# Patient Record
Sex: Female | Born: 1955 | Race: White | Hispanic: No | Smoking: Former smoker
Health system: Southern US, Community
[De-identification: ages and names within clinical notes are randomized; demographics above are authoritative.]

## PROBLEM LIST (undated history)

## (undated) DIAGNOSIS — K219 Gastro-esophageal reflux disease without esophagitis: Secondary | ICD-10-CM

## (undated) DIAGNOSIS — M549 Dorsalgia, unspecified: Secondary | ICD-10-CM

## (undated) DIAGNOSIS — M1990 Unspecified osteoarthritis, unspecified site: Secondary | ICD-10-CM

## (undated) DIAGNOSIS — K7460 Unspecified cirrhosis of liver: Secondary | ICD-10-CM

## (undated) DIAGNOSIS — B1910 Unspecified viral hepatitis B without hepatic coma: Secondary | ICD-10-CM

## (undated) DIAGNOSIS — C139 Malignant neoplasm of hypopharynx, unspecified: Secondary | ICD-10-CM

## (undated) DIAGNOSIS — B159 Hepatitis A without hepatic coma: Secondary | ICD-10-CM

## (undated) DIAGNOSIS — Z720 Tobacco use: Secondary | ICD-10-CM

## (undated) DIAGNOSIS — F329 Major depressive disorder, single episode, unspecified: Secondary | ICD-10-CM

## (undated) DIAGNOSIS — F419 Anxiety disorder, unspecified: Secondary | ICD-10-CM

## (undated) DIAGNOSIS — F32A Depression, unspecified: Secondary | ICD-10-CM

## (undated) DIAGNOSIS — IMO0002 Reserved for concepts with insufficient information to code with codable children: Secondary | ICD-10-CM

## (undated) DIAGNOSIS — G8929 Other chronic pain: Secondary | ICD-10-CM

## (undated) DIAGNOSIS — B1920 Unspecified viral hepatitis C without hepatic coma: Secondary | ICD-10-CM

## (undated) HISTORY — DX: Tobacco use: Z72.0

## (undated) HISTORY — DX: Reserved for concepts with insufficient information to code with codable children: IMO0002

## (undated) HISTORY — PX: OTHER SURGICAL HISTORY: SHX169

## (undated) HISTORY — DX: Major depressive disorder, single episode, unspecified: F32.9

## (undated) HISTORY — DX: Hepatitis a without hepatic coma: B15.9

## (undated) HISTORY — DX: Unspecified viral hepatitis B without hepatic coma: B19.10

## (undated) HISTORY — DX: Depression, unspecified: F32.A

## (undated) HISTORY — DX: Unspecified cirrhosis of liver: K74.60

## (undated) HISTORY — PX: TOTAL ABDOMINAL HYSTERECTOMY: SHX209

## (undated) HISTORY — PX: FOOT FRACTURE SURGERY: SHX645

## (undated) HISTORY — DX: Unspecified viral hepatitis C without hepatic coma: B19.20

---

## 2007-03-30 ENCOUNTER — Ambulatory Visit: Payer: Self-pay | Admitting: Cardiovascular Disease

## 2007-03-30 ENCOUNTER — Inpatient Hospital Stay (HOSPITAL_COMMUNITY): Admission: AC | Admit: 2007-03-30 | Discharge: 2007-05-17 | Payer: Self-pay

## 2007-03-30 ENCOUNTER — Ambulatory Visit: Payer: Self-pay | Admitting: Internal Medicine

## 2007-04-05 ENCOUNTER — Ambulatory Visit: Payer: Self-pay | Admitting: Physical Medicine & Rehabilitation

## 2007-04-11 DIAGNOSIS — B1920 Unspecified viral hepatitis C without hepatic coma: Secondary | ICD-10-CM

## 2007-04-11 HISTORY — PX: FOOT SURGERY: SHX648

## 2007-04-11 HISTORY — DX: Unspecified viral hepatitis C without hepatic coma: B19.20

## 2007-04-12 ENCOUNTER — Encounter: Payer: Self-pay | Admitting: Internal Medicine

## 2007-04-12 ENCOUNTER — Ambulatory Visit: Payer: Self-pay | Admitting: Surgery

## 2007-05-08 ENCOUNTER — Ambulatory Visit: Payer: Self-pay | Admitting: Infectious Diseases

## 2007-05-12 ENCOUNTER — Ambulatory Visit: Payer: Self-pay | Admitting: Internal Medicine

## 2007-05-20 ENCOUNTER — Inpatient Hospital Stay: Payer: Self-pay | Admitting: Internal Medicine

## 2007-06-09 ENCOUNTER — Ambulatory Visit: Payer: Self-pay | Admitting: Internal Medicine

## 2007-09-26 ENCOUNTER — Inpatient Hospital Stay (HOSPITAL_COMMUNITY): Admission: RE | Admit: 2007-09-26 | Discharge: 2007-09-29 | Payer: Self-pay | Admitting: Orthopedic Surgery

## 2007-12-11 ENCOUNTER — Ambulatory Visit (HOSPITAL_COMMUNITY): Admission: RE | Admit: 2007-12-11 | Discharge: 2007-12-11 | Payer: Self-pay | Admitting: Internal Medicine

## 2008-12-08 ENCOUNTER — Ambulatory Visit (HOSPITAL_COMMUNITY): Admission: RE | Admit: 2008-12-08 | Discharge: 2008-12-08 | Payer: Self-pay | Admitting: Orthopedic Surgery

## 2009-01-15 ENCOUNTER — Ambulatory Visit (HOSPITAL_COMMUNITY): Admission: RE | Admit: 2009-01-15 | Discharge: 2009-01-15 | Payer: Self-pay | Admitting: Family Medicine

## 2009-02-05 ENCOUNTER — Ambulatory Visit: Payer: Self-pay | Admitting: Gastroenterology

## 2009-02-17 ENCOUNTER — Ambulatory Visit (HOSPITAL_COMMUNITY): Admission: RE | Admit: 2009-02-17 | Discharge: 2009-02-17 | Payer: Self-pay | Admitting: Gastroenterology

## 2009-03-02 ENCOUNTER — Emergency Department (HOSPITAL_COMMUNITY): Admission: EM | Admit: 2009-03-02 | Discharge: 2009-03-02 | Payer: Self-pay | Admitting: Emergency Medicine

## 2009-04-22 ENCOUNTER — Ambulatory Visit: Payer: Self-pay | Admitting: Gastroenterology

## 2009-05-13 ENCOUNTER — Encounter (INDEPENDENT_AMBULATORY_CARE_PROVIDER_SITE_OTHER): Payer: Self-pay | Admitting: *Deleted

## 2009-06-08 HISTORY — PX: ESOPHAGOGASTRODUODENOSCOPY: SHX1529

## 2009-06-11 ENCOUNTER — Encounter: Payer: Self-pay | Admitting: Orthopedic Surgery

## 2009-06-17 ENCOUNTER — Ambulatory Visit: Payer: Self-pay | Admitting: Gastroenterology

## 2009-06-17 DIAGNOSIS — B182 Chronic viral hepatitis C: Secondary | ICD-10-CM | POA: Insufficient documentation

## 2009-07-01 ENCOUNTER — Ambulatory Visit: Payer: Self-pay | Admitting: Gastroenterology

## 2009-07-01 ENCOUNTER — Ambulatory Visit (HOSPITAL_COMMUNITY): Admission: RE | Admit: 2009-07-01 | Discharge: 2009-07-01 | Payer: Self-pay | Admitting: Gastroenterology

## 2009-07-09 ENCOUNTER — Encounter: Payer: Self-pay | Admitting: Orthopedic Surgery

## 2009-08-08 ENCOUNTER — Encounter: Payer: Self-pay | Admitting: Orthopedic Surgery

## 2009-09-08 ENCOUNTER — Encounter: Payer: Self-pay | Admitting: Orthopedic Surgery

## 2009-10-08 ENCOUNTER — Encounter: Payer: Self-pay | Admitting: Orthopedic Surgery

## 2009-11-19 ENCOUNTER — Encounter: Payer: Self-pay | Admitting: Gastroenterology

## 2009-12-21 ENCOUNTER — Ambulatory Visit: Payer: Self-pay | Admitting: Gastroenterology

## 2009-12-21 DIAGNOSIS — K7460 Unspecified cirrhosis of liver: Secondary | ICD-10-CM | POA: Insufficient documentation

## 2010-01-08 HISTORY — PX: COLONOSCOPY: SHX174

## 2010-01-13 ENCOUNTER — Ambulatory Visit (HOSPITAL_COMMUNITY): Admission: RE | Admit: 2010-01-13 | Discharge: 2010-01-13 | Payer: Self-pay | Admitting: Gastroenterology

## 2010-01-13 ENCOUNTER — Ambulatory Visit: Payer: Self-pay | Admitting: Gastroenterology

## 2010-01-28 ENCOUNTER — Encounter (INDEPENDENT_AMBULATORY_CARE_PROVIDER_SITE_OTHER): Payer: Self-pay | Admitting: *Deleted

## 2010-02-08 DIAGNOSIS — K7460 Unspecified cirrhosis of liver: Secondary | ICD-10-CM

## 2010-02-08 HISTORY — DX: Unspecified cirrhosis of liver: K74.60

## 2010-02-16 ENCOUNTER — Encounter: Payer: Self-pay | Admitting: Gastroenterology

## 2010-02-28 ENCOUNTER — Encounter: Payer: Self-pay | Admitting: Gastroenterology

## 2010-02-28 LAB — CONVERTED CEMR LAB
AFP-Tumor Marker: 14.4 ng/mL — ABNORMAL HIGH (ref 0.0–8.0)
ALT: 49 units/L — ABNORMAL HIGH (ref 0–35)
Alkaline Phosphatase: 82 units/L (ref 39–117)
Basophils Absolute: 0 10*3/uL (ref 0.0–0.1)
Basophils Relative: 0 % (ref 0–1)
CO2: 27 meq/L (ref 19–32)
Creatinine, Ser: 0.79 mg/dL (ref 0.40–1.20)
Eosinophils Absolute: 0 10*3/uL (ref 0.0–0.7)
Hep A Total Ab: POSITIVE — AB
INR: 1.17 (ref <1.50)
MCHC: 35 g/dL (ref 30.0–36.0)
MCV: 92.1 fL (ref 78.0–100.0)
Monocytes Relative: 7 % (ref 3–12)
Neutro Abs: 2.7 10*3/uL (ref 1.7–7.7)
Neutrophils Relative %: 68 % (ref 43–77)
Platelets: 80 10*3/uL — ABNORMAL LOW (ref 150–400)
RDW: 13.9 % (ref 11.5–15.5)
Total Bilirubin: 0.7 mg/dL (ref 0.3–1.2)

## 2010-03-07 ENCOUNTER — Ambulatory Visit (HOSPITAL_COMMUNITY): Admission: RE | Admit: 2010-03-07 | Discharge: 2010-03-07 | Payer: Self-pay | Admitting: Gastroenterology

## 2010-03-07 ENCOUNTER — Telehealth: Payer: Self-pay | Admitting: Gastroenterology

## 2010-05-02 ENCOUNTER — Encounter: Payer: Self-pay | Admitting: Internal Medicine

## 2010-05-10 NOTE — Assessment & Plan Note (Signed)
Summary: HCV, ETOH USE, CIRRHOSIS   Visit Type:  Follow-up Visit Primary Care Provider:  [facility]  Chief Complaint:  F/U cirrhosis.  History of Present Illness: No record of TCS from [place] or [facility]. Saw Dr. Jacqualine Mau recently. Desires an MRI  in [facility] 2011. Pt awaiting decision in HCV treatment. Frequent nausea in the morning. Rare vomiting. No diarrhea, melena, constipation., or BRBPR. Trying to lose weight. No EtOH.  Hypertension History:      Positive major cardiovascular risk factors include female age 57 years old or older and current tobacco user.     Current Medications (verified): 1)  Symbicort 80-4.5 Mcg/act Aero (Budesonide-Formoterol Fumarate) .... As Directed 2)  Lexapro 20 Mg Tabs (Escitalopram Oxalate) .... One and One-Half Tablet Daily 3)  Abilify 15 Mg Tabs (Aripiprazole) .... Take 1 Tablet By Mouth Once A Day 4)  Trazodone Hcl 150 Mg Tabs (Trazodone Hcl) .... Take 1 Tablet By Mouth Once A Day 5)  Adderall 10 Mg Tabs (Amphetamine-Dextroamphetamine) .... Take 1 Tablet By Mouth Once A Day 6)  Calcium .... One Tablet Daily  Allergies (verified): No Known Drug Allergies  Past History:  Past Surgical History: Last updated: 06/17/2009 Pelvic and extremity surgery Hysterectomy: DUB 1995  Family History: Last updated: 06/17/2009 No FH of Colon Cancer or polyps  Social History: Last updated: 06/17/2009 Divorced Patient currently smokes. 1 cig ever now and then Alcohol Use - no, quit 7 years ago  Illicit Drug Use - no  Past Medical History: HEP C Depression, problems concentrating, MHS([place]) Asthma?  EGD MAR 2011: Grade 1 varices TCS ? Hepatitis B SAb POS JUNE 2009  Family History: Reviewed history from 06/17/2009 and no changes required. No FH of Colon Cancer or polyps  Social History: Reviewed history from 06/17/2009 and no changes required. Divorced Patient currently smokes. 1 cig ever now and then Alcohol Use - no, quit 7 years ago  Illicit  Drug Use - no  Review of Systems       2008: CT AP w/ ivc: ? cirrhosis  NOV 2010: MRI ABD: cirrhosis  June 28, 2009:  PT 15.7, INR 1.26, potassium 4.2, creatinine 0.85.  white count 4.8, hemoglobin 13.8, platelets 80.  PER HPI OTHERWISE ALL SYSTEMS NEGATIVE  Vital Signs:  Patient profile:   55 year old female Height:      66 inches Weight:      146 pounds BMI:     23.65 Temp:     98.8 degrees F oral Pulse rate:   72 / minute BP sitting:   120 / 80  (left arm) Cuff size:   regular  Vitals Entered By: Cloria Spring LPN (December 21, 2009 4:31 PM)  Physical Exam  General:  Well developed, well nourished, no acute distress. Head:  Normocephalic and atraumatic. Eyes:  PERRL, no icterus. Mouth:  No deformity or lesions. Neck:  Supple; no masses. Lungs:  Clear throughout to auscultation. Heart:  Regular rate and rhythm; no murmurs. Abdomen:  Soft, nontender and nondistended. Normal bowel sounds. Extremities:  No edema noted. Neurologic:  Alert and  oriented x4;  grossly normal neurologically.  Impression & Recommendations:  Problem # 1:  CHRONIC HEPATITIS C WITHOUT MENTION HEPATIC COMA (ICD-070.54) past ETOH Use and cirrhosis w/ evidence for portal HTN: varices, low plts. BEGIN NADOLOL 20 MG QD. DISCUSSED SIDE EFFECTS. Imaging yearly. AFP/CMP/PT/INR/CBC 2X/YEAR. OPV MAY 2012. Labs in Nov 2011 one week prior to MRI OF THE LIVER.  CC: PCP AND DR. ZACKS  Orders: T-AFP Tumor Markers ([id number]) T-Comprehensive Metabolic Panel [phone]) T-PT (Prothrombin Time) ([address]562) T-CBC w/Diff ([id number]) T-Hepatitis A Antibody ([id number])  Problem # 2:  CIRRHOSIS (ICD-571.5) SEE #1.  Hypertension Assessment/Plan:      The patient's hypertensive risk group is category B: At least one risk factor (excluding diabetes) with no target organ damage.  Today's blood pressure is 120/80.   Prescriptions: CORGARD 20 MG TABS (NADOLOL) 1 by mouth QHS  #30 x 6   Entered and  Authorized by:   West Bali MD   Signed by:   West Bali MD on 12/21/2009   Method used:   Electronically to        [facility] (retail)       [address]       [place], [place]  [address]       Ph: [phone]       Fax: [phone]   RxID:   [id number]   Appended Document: Orders Update    Clinical Lists Changes  Orders: Added new Service order of Est. Patient Level IV (66063) - Signed

## 2010-05-10 NOTE — Letter (Signed)
Summary: Appointment Reminder  [facility]  [address]   [place], [place] [address]   Phone: [phone]  Fax: [phone]       May 13, 2009   [facility] [address] [place], [place]  [address] 1955-06-24    Dear Ms. Colaizzi,  We have been unable to reach you by phone to schedule a follow up   appointment that was recommended for you by Dr. Jena Gauss. It is very   important that we reach you to schedule an appointment. We hope that you  allow [place] to participate in your health care needs. Please contact us at  [phone] at your earliest convenience to schedule your appointment.  Sincerely,    [facility] R. Roetta Sessions, M.D.    Kassie Mends, M.D. Lorenza Burton, FNP-BC    Tana Coast, PA-C Phone: [phone]    Fax: [phone]

## 2010-05-10 NOTE — Letter (Signed)
Summary: EGD ORDER  EGD ORDER   Imported By: Ave Filter 06/17/2009 10:40:41  _____________________________________________________________________  External Attachment:    Type:   Image     Comment:   External Document

## 2010-05-10 NOTE — Progress Notes (Signed)
Summary: NAUSEA       New/Updated Medications: PROMETHAZINE HCL 25 MG TABS (PROMETHAZINE HCL) 1/2-1 by mouth Q4-6H as needed NAUSEA OR VOMITING Prescriptions: PROMETHAZINE HCL 25 MG TABS (PROMETHAZINE HCL) 1/2-1 by mouth Q4-6H as needed NAUSEA OR VOMITING  #30 x 0   Entered and Authorized by:   West Bali MD   Signed by:   West Bali MD on 03/07/2010   Method used:   Electronically to        [facility] (retail)       [address]       [place], [place]  [address]       Ph: [phone]       Fax: [phone]   RxID:   [id number]

## 2010-05-10 NOTE — Letter (Signed)
Summary: MRI OF THE LIVER ORDER  MRI OF THE LIVER ORDER   Imported By: Ave Filter 02/16/2010 09:45:26  _____________________________________________________________________  External Attachment:    Type:   Image     Comment:   External Document

## 2010-05-10 NOTE — Letter (Signed)
Summary: Recall Radiology  [facility]  [address]   [place], [place] [address]   Phone: [phone]  Fax: [phone]    January 28, 2010  [facility] [address] [place], [place]  [address] 1955-11-19   Dear Ms. Kock,   Our office needs to get you scheduled for your MRI. Please give our office a call to schedule this.  You may call the office at your convenience at [phone].  Please ask for the Referral Coordinator to make arrangements for this to be scheduled.  You may have to leave a message on our voice mail.  We will return your call.  If for any reason you do not wish to schedule this, please advise the office.  Please do not neglect your health.   Thank you,    Ave Filter  [facility] Ph: [phone]   Fax: [phone]

## 2010-05-10 NOTE — Assessment & Plan Note (Signed)
Summary: HEPATITIS C, ?CIRRHOSIS   Visit Type:  Initial Consult Referring Provider:  Dr. Brooke Dare Primary Care Provider:  Nobie Putnam, M.D.  Chief Complaint:  Hepatitis C.  History of Present Illness: Never had an EGD. [facility] wants to start Rx for Hep C with a new drug. No nausea, vomiting, heartburn, indigestion, problems swallowing, abd pain, diarrhea, constipation, blood in stool, or black stool.  Appetite: too good. Gaining weight. BM: daily. TCS last FEB 2010-at [facility]: no polyps.  Preventive Screening-Counseling & Management  Alcohol-Tobacco     Smoking Status: current      Drug Use:  no.    Current Medications (verified): 1)  Symbicort 80-4.5 Mcg/act Aero (Budesonide-Formoterol Fumarate) .... As Directed 2)  Lexapro 20 Mg Tabs (Escitalopram Oxalate) .... One and One-Half Tablet Daily 3)  Abilify 15 Mg Tabs (Aripiprazole) .... Take 1 Tablet By Mouth Once A Day 4)  Trazodone Hcl 150 Mg Tabs (Trazodone Hcl) .... Take 1 Tablet By Mouth Once A Day 5)  Adderall 10 Mg Tabs (Amphetamine-Dextroamphetamine) .... Take 1 Tablet By Mouth Once A Day 6)  Calcium .... One Tablet Daily 7)  Ibuprofen .... As Needed  Allergies (verified): No Known Drug Allergies  Past History:  Past Medical History: Depression, problems concentrating Asthma?   Past Surgical History: Pelvic and extremity surgery Hysterectomy: DUB 1995  Family History: No FH of Colon Cancer or polyps  Social History: Divorced Patient currently smokes. 1 cig ever now and then Alcohol Use - no, quit 7 years ago  Illicit Drug Use - no Smoking Status:  current Drug Use:  no  Review of Systems        No problems with sedation. No recollection of propofol. Per HPI otherwise all systems negative. OCT 2010: 155 LBS JAN 2011: 165 LBS   2008: CT ABD w/ ivc-?cirrhosis, nl pancreas and spleen 2009: TBILI 0.7 ALK PHOS 128 AST 56 ALT 53 ALB 3.8 INR 1.3,  2009: VIRAL SEROLOGIES-HBsAB POS, TOTAL HAV POS, HCV POS  AUG  2010: PLT 63   Vital Signs:  Patient profile:   55 year old female Height:      66 inches Weight:      166 pounds BMI:     26.89 Temp:     98.4 degrees F oral Pulse rate:   88 / minute BP sitting:   148 / 88  (left arm) Cuff size:   large  Vitals Entered By: Cloria Spring LPN (June 17, 2009 10:07 AM)  Physical Exam  General:  Well developed, well nourished, no acute distress. Head:  Normocephalic and atraumatic. Eyes:  PERRLA, no icterus. Mouth:  No deformity or lesions, edentulous, upper plates in place. Neck:  Supple; no masses. Lungs:  Clear throughout to auscultation. Heart:  Regular rate and rhythm; no murmurs, rubs,  or bruits. Abdomen:  Soft, mild TTP in LLQ and nondistended.  Normal bowel sounds without guarding and without rebound.   Extremities:  No edema  noted. Neurologic:  Alert and  oriented x4;  grossly normal neurologically. flat affect.  Impression & Recommendations:  Problem # 1:  CHRONIC HEPATITIS C WITHOUT MENTION HEPATIC COMA (ICD-070.54)  Pt needs screening for varices. EGD MAR 24 w/ propofol due to many psychoactive drugs. OPV as needed. Obtain TCS report. Obtain records form [facility] for the past year.  CC: PCP, and Dr. Jacqualine Mau  Orders: Consultation Level III [phone])      Appended Document: HEPATITIS C, ?CIRRHOSIS NO TCS DONE AT [facility] SINCE 2000.  Appended  Document: HEPATITIS C, ?CIRRHOSIS No TCS at [facility] or [facility] since 2000.

## 2010-05-10 NOTE — Letter (Signed)
Summary: re-evaluation note -dr. Jacqualine Mau  re-evaluation note -dr. Jacqualine Mau   Imported By: Rosine Beat 11/19/2009 13:24:56  _____________________________________________________________________  External Attachment:    Type:   Image     Comment:   External Document

## 2010-05-23 ENCOUNTER — Encounter: Payer: Self-pay | Admitting: Urgent Care

## 2010-06-01 NOTE — Medication Information (Signed)
Summary: OMEPRAZOLE DR 20MG   OMEPRAZOLE DR 20MG    Imported By: Rexene Alberts 05/23/2010 09:39:12  _____________________________________________________________________  External Attachment:    Type:   Image     Comment:   External Document  Appended Document: OMEPRAZOLE DR 20MG     Prescriptions: OMEPRAZOLE 20 MG CPDR (OMEPRAZOLE) 1 by mouth daily for acid reflux  #31 x 11   Entered and Authorized by:   Joselyn Arrow FNP-BC   Signed by:   Joselyn Arrow FNP-BC on 05/23/2010   Method used:   Electronically to        [facility] (retail)       [address]       [place], [place]  [address]       Ph: [phone]       Fax: [phone]   RxID:   [id number]

## 2010-06-23 LAB — COMPREHENSIVE METABOLIC PANEL
ALT: 64 U/L — ABNORMAL HIGH (ref 0–35)
Albumin: 3.5 g/dL (ref 3.5–5.2)
Alkaline Phosphatase: 91 U/L (ref 39–117)
Chloride: 105 mEq/L (ref 96–112)
Potassium: 4.8 mEq/L (ref 3.5–5.1)
Sodium: 140 mEq/L (ref 135–145)
Total Protein: 8 g/dL (ref 6.0–8.3)

## 2010-06-23 LAB — CBC
HCT: 37.8 % (ref 36.0–46.0)
Platelets: 67 10*3/uL — ABNORMAL LOW (ref 150–400)
RBC: 3.99 MIL/uL (ref 3.87–5.11)
RDW: 13.5 % (ref 11.5–15.5)
WBC: 4.1 10*3/uL (ref 4.0–10.5)

## 2010-06-23 LAB — DIFFERENTIAL
Basophils Relative: 1 % (ref 0–1)
Eosinophils Absolute: 0.1 10*3/uL (ref 0.0–0.7)
Eosinophils Relative: 2 % (ref 0–5)
Monocytes Absolute: 0.3 10*3/uL (ref 0.1–1.0)
Monocytes Relative: 9 % (ref 3–12)

## 2010-06-23 LAB — HEPATITIS A ANTIBODY, TOTAL: Hep A Total Ab: POSITIVE — AB

## 2010-07-04 LAB — CBC
HCT: 39.5 % (ref 36.0–46.0)
MCV: 94.7 fL (ref 78.0–100.0)
Platelets: 80 10*3/uL — ABNORMAL LOW (ref 150–400)
RDW: 13.3 % (ref 11.5–15.5)
WBC: 4.8 10*3/uL (ref 4.0–10.5)

## 2010-07-04 LAB — BASIC METABOLIC PANEL
BUN: 9 mg/dL (ref 6–23)
CO2: 24 mEq/L (ref 19–32)
Chloride: 103 mEq/L (ref 96–112)
Glucose, Bld: 96 mg/dL (ref 70–99)
Potassium: 4.2 mEq/L (ref 3.5–5.1)

## 2010-07-16 LAB — CBC
MCHC: 34.7 g/dL (ref 30.0–36.0)
MCV: 102.6 fL — ABNORMAL HIGH (ref 78.0–100.0)
RBC: 3.96 MIL/uL (ref 3.87–5.11)

## 2010-08-11 ENCOUNTER — Ambulatory Visit (INDEPENDENT_AMBULATORY_CARE_PROVIDER_SITE_OTHER): Payer: Medicaid Other | Admitting: Gastroenterology

## 2010-08-11 DIAGNOSIS — K7460 Unspecified cirrhosis of liver: Secondary | ICD-10-CM

## 2010-08-11 DIAGNOSIS — B182 Chronic viral hepatitis C: Secondary | ICD-10-CM

## 2010-08-16 ENCOUNTER — Encounter: Payer: Self-pay | Admitting: Gastroenterology

## 2010-08-23 NOTE — Op Note (Signed)
Tiffany Hale, Tiffany Hale            ACCOUNT NO.:  [id number]   MEDICAL RECORD NO.:  [id number]          PATIENT TYPE:  INP   LOCATION:  3103                         FACILITY:  [facility]   PHYSICIAN:  Doralee Albino. Carola Frost, M.D. DATE OF BIRTH:  April 21, 1955   DATE OF PROCEDURE:  04/02/2007  DATE OF DISCHARGE:                               OPERATIVE REPORT   PREOPERATIVE DIAGNOSIS:  1. Left iliac wing fracture.  2. Blisters over the right foot and ankle.   POSTOPERATIVE DIAGNOSIS:  1. Left iliac wing fracture.  2. Blisters over the right foot and ankle.   PROCEDURE:  1. Open reduction and internal fixation left iliac/ pelvis.  2. Dressing change under anesthesia, right foot and ankle.  3. Incision and drainage of bullae, right foot and ankle.   SURGEON:  Budd Palmer, MD   ASSISTANT:  Mearl Latin, PA   ANESTHESIA:  General, Dr. Sondra Come.   COMPLICATIONS:  None.   ESTIMATED BLOOD LOSS:  150.   DRAINS:  None.   DISPOSITION:  To PACU.   CONDITION:  Stable.   BRIEF SUMMARY AND INDICATION FOR PROCEDURE:  Tiffany Hale is a 55-  year-old female who sustained severe right calcaneus and ankle fracture,  as well as left acetabulum/pelvic fracture.  She underwent observation  in the ICU until such time as the trauma service felt she was safe to  proceed for operative fixation.  She did undergo angio, but she has not  demonstrated any active arterial bleeding.  We discussed preoperatively  the risks and benefits of surgical treatment of her pelvic fracture, as  well as of her soft tissue injuries on the right foot and ankle.  She  understood those to include infection, failure to prevent infection,  need for further surgery, nerve, vessel injury particularly lateral  thigh, numbness, blood loss, crying , transfusion.  Malunion, nonunion,  arthritis, and others.  After full discussion, she wished to proceed.   BRIEF DESCRIPTION OF PROCEDURE:  Ms. Marcel was administered preop  antibiotics, taken to the operating room where general anesthesia was  induced.  We then removed the dressings on the right foot and ankle.  We  then identified a large horseshoe blister which extended nearly  circumferentially, given the appearance of saddle bags medially and  laterally.  There was over 250 mL of serous fluid, and the bullae  laterally extended from above the ankle, all the way out to the toe  creases beyond the metatarsal heads and medially above the ankle, down  to the first MTP joint.  There was also an additional plantar bullae  that was separate.  The largest blister was evacuated by using a 15  blade adjacent to where the bullae connected posteriorly around the  Achilles tendon.  Also made a small incision with the knife around the  plantar bullae as well.  After evacuation we applied Mepitel dressings  and then softly compressive gauze, and Kerlix, followed by Ace wrap and  lastly a night splint to facilitate further dressing changes.   We then performed a standard prep and drape of the left side of  the  pelvis.  The hip was flexed and also a small bolster placed under the  left side of the pelvis as well.  We made a standard anterior approach  with a curvilinear incision over where the anterior superior iliac spine  was projected to be in its reduced position.  Carried dissection down  looking for the lateral femoral cutaneous nerve beneath the fascia.  We  did identify a nerve, but it was questionable whether or not this was in  fact the lateral femoral cutaneous.  It was not stretched excessively  during the case and was protected.  We used a Farabeuf clamp to mobilize  the fracture.  After cleaning it with lavage and curets, we then  obtained provisional reduction and K-wires to maintain this.  The K-  wires were then exchanged for multiple lag screws using 35 fixation.  It  should be noted that prior to beginning on the pelvis, we did obtain AP  and Judet  views to evaluate for significant extension into the  acetabulum that may have required a more formal reduction of the  articular surface.  This was not visualized and consequently our primary  goal was to restore integrity to the inguinal ligament and rectus  insertion along the anterior aspect of the pelvis and anterior column of  the acetabulum.  We obtained multiple x-ray views including AP, Judet,  and inlet/ outlet and these confirmed appropriate reduction, and  placement of hardware.  We then thoroughly irrigated and closed in  standard layered fashion using a #1 Vicryl, 0 Vicryl, and a 2-0, and  staples for the skin.  Sterile, gently compressive dressing was applied.  The patient was awakened for anesthesia and transported to PACU in  stable condition.   PROGNOSIS:  Ms. Krenzer has had severe injuries to her pelvis and her  right calcaneus and ankle.  Her soft tissue condition precludes surgery  for several weeks.  She will need to be on DVT prophylaxis until such  time as she can safely undergo definitive reconstruction.  We do  anticipate her being able to weight bear on the left side, but we will  have her refrain from active hip flexion, and particularly flexion  against resistance until we see clinical and radiographic evidence of  healing.  Of note, her bone quality seemed to be quite poor, which is  something we will factor into our decision making regarding the  calcaneus.  This also increases the likelihood of loss of fixation and  complications related to same.      Doralee Albino. Carola Frost, M.D.  Electronically Signed     MHH/MEDQ  D:  04/02/2007  T:  04/02/2007  Job:  [id number]

## 2010-08-23 NOTE — Op Note (Signed)
NAMELARKIN, ALFRED            ACCOUNT NO.:  [id number]   MEDICAL RECORD NO.:  [id number]          PATIENT TYPE:  AMB   LOCATION:  SDS                          FACILITY:  [facility]   PHYSICIAN:  Doralee Albino. Carola Frost, M.D. DATE OF BIRTH:  Nov 29, 1955   DATE OF PROCEDURE:  12/08/2008  DATE OF DISCHARGE:  12/08/2008                               OPERATIVE REPORT   PREOPERATIVE DIAGNOSIS:  Symptomatic hardware, right calcaneus.   POSTOPERATIVE DIAGNOSIS:  Symptomatic hardware, right calcaneus.   PROCEDURE:  Removal of the implant right calcaneus.   SURGEON:  Doralee Albino. Carola Frost, MD   ASSISTANT:  Mearl Latin, PA.   ANESTHESIA:  General.   COMPLICATIONS:  None.   ESTIMATED BLOOD LOSS:  Minimal.   DISPOSITION:  PACU.   CONDITION:  Stable.   BRIEF SUMMARY OF INDICATIONS FOR PROCEDURE:  Jalesa Thien is a 35-  year-old female who sustained a severe right calcaneus fracture treated  with subtalar fusion.  The patient went on to unite but has had  progressive pain related to her hardware and she now requests this  removal.  I discussed with her the risks and benefits of surgery  including the possibility of failure to alleviate symptoms, need for  further surgery, and multiple others including DVT, infection, nerve  injury, or vessel injury.  After full discussion, she wished to proceed.   BRIEF DESCRIPTION OF PROCEDURE:  Ms. Ellwanger was administered  preoperative antibiotics, taken to the operating room where general  anesthesia was induced.  Her right lower extremity was prepped and  draped in the usual sterile fashion.  No tourniquet was used.  C-arm was  used to identify the correct trajectory and location for re-making  incisions along the old surgical scars.  Dissection was carried down  with a hemostat identifying the head of the screws.  The guide pin was  inserted into the 2 large cannulated screws and then all screws were  withdrawn using several small stab incisions.   Wounds were irrigated and  closed with 3-0 nylon.  Sterile gently compressive dressing was applied.  The patient was awakened from anesthesia and transported to the PACU in  stable condition.  Montez Morita, PA-C, assisted me throughout the  procedure.   PROGNOSIS:  Ms. Halder will continue with limited weightbearing until  her incisions heal over the next 10 days or so.  We will see her back at  that time, 10-14 days for removal of her sutures.  She will contact us  in the interim with any problems, concerns, or questions.  She will not  require any formal DVT prophylaxis.      Doralee Albino. Carola Frost, M.D.  Electronically Signed     MHH/MEDQ  D:  12/08/2008  T:  12/09/2008  Job:  [id number]

## 2010-08-23 NOTE — Consult Note (Signed)
Tiffany Hale, Tiffany Hale            ACCOUNT NO.:  [id number]   MEDICAL RECORD NO.:  [id number]          PATIENT TYPE:  INP   LOCATION:  5018                         FACILITY:  [facility]   PHYSICIAN:  Antonietta Breach, M.D.  DATE OF BIRTH:  [birth year]   DATE OF CONSULTATION:  05/08/2007  DATE OF DISCHARGE:                                 CONSULTATION   HISTORY OF PRESENT ILLNESS:  Tiffany Hale does continue with delusions,  hallucinations and severe agitation.  She has received a dosage of 5 mg  Zyprexa today and has tolerated well.  There is no excess sedation.   QTC on recent telemetry shows 530 milliseconds.   MENTAL STATUS EXAM:  The patient is alert, highly distractible.  Thought  process disorganized.  Oriented to person only.  Affect mildly agitated  after the Zyprexa.  Judgment impaired.  Insight poor.   ASSESSMENT:  (293.00)  Delirium not otherwise specified.   PLAN:  Will hold Zyprexa for now until it can be verified that the  patient's QTC interval is less than 500 or verified by Cardiology input  that there is no contraindication to proceeding with Zyprexa.      Antonietta Breach, M.D.  Electronically Signed     JW/MEDQ  D:  05/09/2007  T:  05/09/2007  Job:  [id number]

## 2010-08-23 NOTE — Discharge Summary (Signed)
NAMECAMDYN, Tiffany Hale            ACCOUNT NO.:  [id number]   MEDICAL RECORD NO.:  [id number]          PATIENT TYPE:  INP   LOCATION:  5018                         FACILITY:  [facility]   PHYSICIAN:  Gabrielle Dare. Janee Morn, M.D.DATE OF BIRTH:  [birth year]   DATE OF ADMISSION:  03/30/2007  DATE OF DISCHARGE:  05/17/2007                               DISCHARGE SUMMARY   DISCHARGE DIAGNOSES:  1. Suicide attempt by jumping from a third story window.  2. Traumatic brain injury with concussion.  3. Multiple facial fractures.  4. Left iliac wing and acetabular fracture.  5. Right ankle fracture.  6. Pelvic hematoma.  7. Depression.  8. Polysubstance abuse.  9. Right calcaneus fracture.  10.Acute blood loss anemia.  11.Thrombocytopenia.  12.Left first toe proximal phalanx fracture.  13.ARDS.  14.Pneumonia.  15.Hyperglycemia.  16.Hypokalemia.  17.Hypernatremia.  18.Methicillin resistant staph aureus pneumonia.  19.Hepatitis C.  20.Chin laceration.   CONSULTATIONS:  1. Newman Pies, M.D., for oral maxillofacial surgery.  2. Francena Hanly, M.D., for orthopedic surgery.  3. Myrene Galas, M.D., for orthopedic surgery.  4. Critical Care Medicine.  5. Antonietta Breach, M.D., Psychiatry.   PROCEDURES:  1. Draining of fracture blister of right foot and incision and      drainage of left iliac wing fracture by Dr. Carola Frost.  2. Multiple blood transfusions.  3. Intubation.   HISTORY OF PRESENT ILLNESS:  This is a 55 year old white female who  jumped from a third story window following release from prison. She left  a suicide note. She apparently had been up for several days after taking  large amounts of unknown pills and other substances. She comes in as a  Silver Trauma alert and was upgraded to Gold, with altered loss of  consciousness. She complains of headache, shoulder, and ankle pain.   LABORATORY DATA:  The patient's workup demonstrated the multiple facial  fractures, the pelvic, and right  ankle fracture. She had a small chin  laceration as well. She was admitted to the Intensive Care Unit and the  appropriate specialties were consulted.   HOSPITAL COURSE:  The patient rather quickly had a ORIF of her pelvic  fracture and I&D of her ankle fracture. Her mental status improved  quickly and she mainly complained of pain. Following her pelvic surgery,  it was decided that skilled nursing facility placement was the most  appropriate venue for her, if she was unable to do much with physical  therapy. While waiting for skilled nursing facility placement, she  developed some acute delirium. This quickly progressed to delirium with  respiratory insufficiency and she required intubation. Immediately, her  chest x-ray demonstrated very severe ARDS with an atypical pattern. An  infectious cause was thought initially but even though her chest x-ray  appeared atypical, no significant pathogens were grown that would  explain that. She required very aggressive ventilator settings to manage  her pulmonary disease. Critical care medicine was the main service in  charge of this. At some point, when it appeared that she was not making  any progress, we did speak with the family about withdrawal. This was  seriously considered but they decided to wait it out, since she began to  improve during this time frame. Eventually, she was able to improve to  the point where we could extubate her. Her mental status took some time  to clear but eventually did. Again, skilled nursing facility placement  was sought for the patient, who was quite deconditioned after her  extended time on the ventilator, as well as her continued weight bearing  restrictions on her right lower extremity. However, family decided they  wanted to take her home. She went home with her family in good  condition.   DISCHARGE MEDICATIONS:  1. Lopressor 50 mg take 1 p.o. b.i.d., #60 with no refill.  2. Combivent inhaler to use 2  puffs q.6 hours p.r.n. #1 with no      refill.  3. Metformin 500 mg take 1 p.o. b.i.d. #60 with no refill.  4. Ambien 10 mg tablets take 1 p.o. q.h.s. p.r.n. insomnia #30 with no      refill.   FOLLOWUP:  1. The patient will establish with a primary care physician sometimes      in the next 4 weeks in the [place] area, where she will be staying      with her son.  2. She is to followup with Dr. Carola Frost in 2 weeks.  3. Followup with the pulmonologist and with oral maxillofacial      surgery, as well as trauma surgery can be on an as needed basis.   SPECIAL INSTRUCTIONS:  1. She will be going home with home health, PT, OT, speech therapy for      cognition and swallow, as well as significant durable medical      equipment.  2. She continues to have a weight bearing restriction for the right      lower extremity. This was communicated to the      family.  3. She also has a diet restriction to nectar thick liquids and this      too was communicated to the family.  4. Questions should be directed toward the trauma service.      Earney Hamburg, P.A.      Gabrielle Dare Janee Morn, M.D.  Electronically Signed    MJ/MEDQ  D:  05/17/2007  T:  05/17/2007  Job:  [id number]   cc:   Vania Rea. Supple, M.D.  Doralee Albino. Carola Frost, M.D.  Newman Pies, MD  Antonietta Breach, M.D.  Critical Care Medicine

## 2010-08-23 NOTE — Consult Note (Signed)
[name], [name]            ACCOUNT NO.:  [id number]   MEDICAL RECORD NO.:  [id number]          PATIENT TYPE:  INP   LOCATION:  3103                         FACILITY:  [facility]   PHYSICIAN:  Doralee Albino. Carola Frost, M.D. DATE OF BIRTH:  1955-12-17   DATE OF CONSULTATION:  03/31/2007  DATE OF DISCHARGE:                                 CONSULTATION   REASON FOR CONSULTATION REQUEST:  Severe right calcaneus fracture,  bimalleolar ankle fracture, right and left pelvic fracture.   BRIEF HISTORY OF PRESENTATION:  Tiffany Hale is a 55 year old female  who jumped out of a third story window in an attempted suicide,  sustaining multiple injuries including facial fractures, left pelvic  wing and right bimalleolar ankle fractures.  She did also experience  acute blood loss anemia requiring a transfusion.  She underwent  angiography for a pelvic hematoma which did not demonstrate continued  bleeding.  She also had a concussion.  At this time she is alert,  oriented, sitting upright in bed, complaining of right ankle pain.  She  denies numbness or tingling in the left lower extremity.  She denies  upper extremity injury, but does report decreased sensation in the right  foot on the dorsal and plantar aspects of.   PAST MEDICAL HISTORY:  1. Depression.  2. Alcohol abuse; sober for 6 years.   KNOWN MEDICATIONS:  1. Adderall.  2. Prozac.   SOCIAL HISTORY:  Again, a history of alcohol abuse, but sober for 6  years.  She does smoke half a pack a day of cigarettes.  She currently  lives with her mother.  She has an adult child who is married and living  in the area.   ALLERGIES:  NO KNOWN DRUG ALLERGIES.   REVIEW OF SYSTEMS:  As above.  Also additional reviewed and included in  the chart.   PHYSICAL EXAMINATION:  The patient has an ecchymotic face with  significant swelling.  The left pelvic wing is tender.  She has some  swelling in the area but no significant ecchymosis.  Examination of  the  shoulders, elbows, wrists and hands bilaterally notable for the absence  of any significant focal tenderness, crepitus, blocked motion or  diminishment in strength.  Radial, median and ulnar sensory and motor  function are grossly intact with 2+ radial pulses.  Examination of the  left lower extremity notable for absence of focal tenderness,  ecchymosis, crepitus, block to motion or diminished strength at the  greater trochanter, knee and ankle.  No ligamentous laxity or  instability.  On the right, she does have some swelling of the calf;  compartments seem soft.  She is able to flex and extend the great toes;  the lesser toes to a much lesser degree.  She has extensive bullae that  are clear extending over the dorsum of the foot.  She is immobilized in  a gently compressive splint.  She has capillary refill of 2-3 seconds  out into the toes.  She does have a cross toe deformity of the second,  decreased sensation over the plantar aspect, decreased sensation over  the  superficial and deep peroneal nerves, as well.  Strength exam  confounded by the pain of course.   LABORATORY STUDIES:  Notable for decreased hematocrit; otherwise,  reviewed and included in the chart without significant other findings.   X-rays and CT scan of the pelvis, ankle and foot were all reviewed.  These demonstrate a displaced avulsion fracture of the entire anterior  crest of the left pelvis, also a new lateral compression type II  fracture with sacral involvement and pelvic wing involvement.  The right  calcaneus has severe articular comminution and depression shortening.  There is a bimalleolar ankle fracture, as well, with a Weber C type  comminuted fracture involving the fibula above the joint and a medial  malleolus fracture which is not significantly comminuted but is somewhat  displaced.   ASSESSMENT:  1. Left anterior pelvis fracture and avulsion, LC1 pattern injury/      anterior column of  acetabulum  2. In addition, a second right bimalleolar ankle fracture, Weber C.  3. Right comminuted intra-articular fracture of the calcaneus.  4. Fracture of the right navicular bone.  5. Severe bullae right foot and ankle.   PLAN:  I have recommended internal fixation of left pelvic wing, and  will try to achieve this tomorrow.  Given the significant soft tissue  swelling of the right foot, will need to delay any sort of definitive  treatment over there until soft tissues allow.  At this time, we will  follow her nerve deficits expectantly.  It is unclear whether or not an  occult compartment syndrome could have occurred.  If so, there would be  no indication at this time to intervene, given it has been 48 hours.  This seems quite unlikely, given her examination at this time, and I  would suspect that the deficit is related to the ankle trauma rather  than muscle injury more proximally.  I have discussed his case with Dr.  Rennis Chris, and he requested I assume care, which I will be happy to do.  I  have discussed with the patient the risks and benefits of surgery  including the possibility of infection, nerve injury, vessel injury,  nonunion, malunion, need for further surgery, nerve injury, particularly  lateral femoral cutaneous nerve, vessel injury requiring further  procedures and need for further surgery.  After full discussion, the  patient wishes to proceed.  She also understands other perioperative  risks such as heart attack, stroke, DVT, PE and others.      Doralee Albino. Carola Frost, M.D.  Electronically Signed     MHH/MEDQ  D:  04/01/2007  T:  04/01/2007  Job:  [id number]

## 2010-08-23 NOTE — Consult Note (Signed)
Tiffany Hale, EBEL NO.:  [id number]   MEDICAL RECORD NO.:  [id number]          PATIENT TYPE:  INP   LOCATION:  2116                         FACILITY:  [facility]   PHYSICIAN:  Cristi Loron, M.D.DATE OF BIRTH:  [birth year]   DATE OF CONSULTATION:  04/23/2007  DATE OF DISCHARGE:                                 CONSULTATION   CHIEF COMPLAINT:  Decreased mental status.   HISTORY OF PRESENT ILLNESS:  The patient is a 55 year old white female  who by report attempted suicide by jumping from a third-story window.  She evidently left the suicide note.  The patient was brought to the  emergency department via EMS and evaluated by the trauma service.  She  had multiple injuries and was admitted.  The patient had several  operations.  The patient had had prolonged hospital course.  She has had  respiratory failure and has had a prolong course of mechanical  ventilation/intubation.  She has also had a prolong course of chemical  paralysis with sedation.  At some point, the patient has had a decreased  mental status and a frank-CT scan obtained today demonstrating perhaps a  diffuse subarachnoid hemorrhage and neurosurgical consultation was  requested.   Presently, the patient is sedated with Versed and Fentanyl.  She is  intubated.  There is no family members around and her history is  obtained via the medical records.   PAST MEDICAL HISTORY:  As above; positive for suicide attempt, traumatic  brain injury, concussion, multiple facial fractures, left iliac ring  fracture with right ankle fracture, pelvic hematoma, depressions,  polysubstance abuse, right calcaneus fracture, thrombocytopenia, right  first toe proximal phalanx fracture, pneumonia, ARDS, hepatitis C,  hyperglycemia, hyperkalemia, hypernatremia, and alcohol abuse.   PAST SURGICAL HISTORY:  Open reduction and internal fixation of the left  iliac and pelvis, incision and drainage of both legs, right foot,  and  ankle.   ALLERGIES:  See note on drug allergies.   PRESENT MEDICATIONS:  1. Lovenox 40 mg p.o. daily.  2. Thiamine 100 mg p.o. daily.  3. Multivitamins 1 p.o. daily.  4. Folic acid 1 mg p.o. daily.  5. Combivent inhaler 1 puff q.6 h.  6. Insulin sliding scale.  7. Lacri-Lube ophthalmic drops.  8. Protonix 40 mg daily.  9. Chlorhexidine 15 mL b.i.d.  10.Ferrous sulfate 300 mg t.i.d.  11.Lopressor 50 mg p.o. b.i.d.  12.Reglan 10 mg IV q.6 h.  13.K-Dur 40 mg q.12 h. p.o.  14.Lantus insulin 15 units subcutaneously q.12 h.  15.Primaxin IV per pharmacy protocol.  16.Lasix 20 mg q.6 h. IV.  17.Enteral feeds.  18.Zofran p.r.n.  19.Neosporin p.r.n.  20.Haldol p.r.n.  21.Versed p.r.n.  22.Tylenol p.r.n.  23.Fentanyl IV.  24.Lopressor.  25.Hydralazine p.r.n.   FAMILY MEDICAL HISTORY:  None known.   SOCIAL HISTORY:  History of drug abuse and ethanol abuse as above.  The  patient evidently lives with her mother.   REVIEW OF SYSTEMS:  Unobtainable.   PHYSICAL EXAMINATION:  GENERAL:  A 55 year old white traumatized female  sedated on ventilator.  HEENT:  Normocephalic.  The patient has left  periorbital old ecchymosis.  Pupils are 3 mm of active OU.  Extraocular muscles could not be  examined.  She is edentulous.  I do not see any evidence of CSF,  otorrhea, or rhinorrhea.  NECK:  Without obvious deformities to palpation.  No jugular venous  distention.  THORAX:  Symmetric.  HEART:  Regular rhythm.  ABDOMEN:  Soft and nontender.  EXTREMITIES:  The patient has orthosis on right lower extremity.  BACK:  Grossly normal.  NEUROLOGIC:  The patient's Glasgow coma scale is 7, E2V1M4.  She weakly  and abnormally flexes her extremities to deep painful stimuli.  I was  unable to get her to move her lower extremities.  Her deep tendon  reflexes are 2/4 in bilateral biceps, triceps, brachioradialis,  quadriceps, and gastrocnemius on the left.  Reflexes on the left was  absent.  I  could not test on the right because of the orthosis.  Sensory, cerebellar, and cranial nerve function was impossible except as  follows; she has a corneal reflex bilaterally.  She has a calf reflex.   IMAGING STUDIES:  1. I have reviewed the patient's admission head CT when last obtained      during the intracranial exam performed on March 30, 2007.  There      was no hemorrhages of skull fractures, etc.  2. I also reviewed the patient's follow up CT performed on April 23, 2007.  It demonstrates the patient's right maxillary and sphenoid      sinusitis.  She has a minimal and questionable diffuse subarachnoid      hemorrhage and complexities.  I do not see any evidence of blood in      cisterns.  No large sub dermatomes and hematoma, etc.  3. I also reviewed the patient's cervical CT performed on March 30, 2007.  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  She has diffuse      degenerative changes.  I do not see any acute fractures.   ASSESSMENT AND PLAN:  1. Decreased mental status.  This is likely secondary to the patient's      prolonged hospital course and ARDS, prolong intubation, sedation,      and chemical paralysis, etc.  This will likely prove with time.  If      it does not, and      the patient is febrile, she may need a spinal tap (she is already      on Primaxin).  2. Subarachnoid hemorrhage.  This is quite subtle and minimal perhaps      secondary to Lovenox.  I recommended repeating her CAT scan in few      days.      Cristi Loron, M.D.  Electronically Signed     JDJ/MEDQ  D:  04/23/2007  T:  04/24/2007  Job:  [id number]

## 2010-08-23 NOTE — Op Note (Signed)
[name], [name]            ACCOUNT NO.:  [id number]   MEDICAL RECORD NO.:  [id number]          PATIENT TYPE:  OIB   LOCATION:  5010                         FACILITY:  [facility]   PHYSICIAN:  Doralee Albino. Carola Frost, M.D. DATE OF BIRTH:  26-Apr-1955   DATE OF PROCEDURE:  09/26/2007  DATE OF DISCHARGE:                               OPERATIVE REPORT   PREOPERATIVE DIAGNOSIS:  Right calcaneus malunion.   POSTOPERATIVE DIAGNOSIS:  Right calcaneus malunion.   PROCEDURE:  1. Right calcaneus osteotomy, Dwyer.  2. Bone block subtalar fusion.   SURGEON:  Doralee Albino. Carola Frost, M.D.   ASSISTANT:  Mearl Latin, [place].   ANESTHESIA:  General.   TOTAL TOURNIQUET TIME:  2 hours and 7 minutes.   ESTIMATED BLOOD LOSS:  80 mL.   DRAINS:  One.   DISPOSITION:  To PACU.   CONDITION:  Stable.   BRIEF SUMMARY AND INDICATION FOR PROCEDURE:  Tiffany Hale is a 55-  year-old female, status post 3-storey jump from a window, resulting in  severe calcaneus fracture with the talus essentially being on the  plantar aspect of the foot.  She had undergone a period 6 months of soft  tissue swelling resolution.  She is an extremist for the initial period  of time and also has soft tissue blisters and below that were  circumferentially and precluded surgical management at that time.  She  has had unrelenting pain and now presents for elective reconstruction  realizing the risk to include the possibility of deep infection from  skin breakdown and wound problems, failure to alleviate her symptoms,  need for further surgery, DVT, PE, and others including eventual  amputation.   BRIEF DESCRIPTION OF PROCEDURE:  Tiffany Hale was taken to operating room  where general anesthesia was induced.  She was positioned right side up  and her right lower extremity prepped and draped in usual sterile  fashion.  We made a standard extensile approach to the calcaneus after  elevation of the tourniquet to 300 mmHg.  The peroneus  longus and brevis  tendons were identified.  They were completely pushed out of the  interval between fibula and the calcaneus.  This space had been entirely  obliterated.  We again used K-wires for retraction of the flaps, so that  we had no touch technique.  We used a Osteotone to remove the lateral  wall and then made a separate distal lateral-to-proximal medial  osteotomy, by which we could translate the calcaneus proximally to  obtain more length as well as bring it down to obtain more HIDA.  After  the osteotomy was made, we could visualize the subtalar joint.  Calcaneus still had some subchondral cartilage as did the inferior  aspect of the talus.   This was denuded with an Osteotone and curettes and then fish scaled.  We placed the lamina spreader into this space to restore appropriate  HIDA information to the talus.  We then translated the tuberosity as for  medially as we could and also swung into valgus.  Her bone quality was  exceedingly poor and it was difficult to obtain  the proper amount of  valgus despite our best efforts and adequate soft tissue release.  Similarly, we were constrained with respect to the total amount of  height we were able to obtain.  We then obtained provisional fixation  with multiple K-wires and exchanged these for cannulated titanium 6.5  screws to the tuberosity into the talar body and head as well as 3.5  screws from proximal to distal into the anterior facet of the calcaneus.  In to the defect prior to fixation was placed segments of osteotomized  bone in the form of bone block off the calcaneus.  We used additional  local graft as well as 10 mL VITOSS pack, which was placed into this  area.  This gave [place] an excellent fitting and feel with appropriate  recovery of the contour to the calcaneus.  We were careful to make sure  that appropriate channel was left for the peroneal tendons and that this  area was smooth to avoid abrasion to the tendons,  which looks  surprisingly healthy.  We irrigated thoroughly, deflated the tourniquet,  obtained hemostasis with electrocautery and performed a standard layer  closure with 2-0 Vicryl, 3-0 Vicryl, and 3-0 nylon.  Sterile, gently  compressive, fluff dressing and bulky Jones was then applied as well as  posterior splint.  The patient was awakened from the anesthesia and  transported to PACU in stable condition.  Prior to closure, we did place  a deep drain.   Montez Morita, PA, assisted throughout the procedure with retraction and  then maintenance of reduction during instrumentation as well as  placement of fixation while I held reduction in some cases.  He was  critical to the completion of the procedure.   PROGNOSIS:  Tiffany Hale has had a horrendous injury to her right lower  extremity and remains at high risk for complications including wound  breakdown deep infection and continued pain and will be on DVT  prophylaxis with Lovenox.  She will have unrestrictive range of motion  of her ankle joint as soon as her wound heals, but would need to be non  weightbearing for the next 6-8 weeks.  She is gradually weightbearing  thereafter.      Doralee Albino. Carola Frost, M.D.  Electronically Signed     MHH/MEDQ  D:  09/26/2007  T:  09/27/2007  Job:  [id number]
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
## 2010-08-31 ENCOUNTER — Other Ambulatory Visit: Payer: Self-pay | Admitting: Gastroenterology

## 2010-08-31 ENCOUNTER — Ambulatory Visit (INDEPENDENT_AMBULATORY_CARE_PROVIDER_SITE_OTHER): Payer: Medicaid Other | Admitting: Gastroenterology

## 2010-08-31 ENCOUNTER — Encounter: Payer: Self-pay | Admitting: Gastroenterology

## 2010-08-31 VITALS — BP 141/81 | HR 72 | Temp 98.2°F | Ht 65.0 in | Wt 139.8 lb

## 2010-08-31 DIAGNOSIS — B1920 Unspecified viral hepatitis C without hepatic coma: Secondary | ICD-10-CM

## 2010-08-31 DIAGNOSIS — B182 Chronic viral hepatitis C: Secondary | ICD-10-CM

## 2010-08-31 DIAGNOSIS — K7460 Unspecified cirrhosis of liver: Secondary | ICD-10-CM

## 2010-08-31 NOTE — Assessment & Plan Note (Signed)
Pending workup/treatment at [facility].

## 2010-08-31 NOTE — Progress Notes (Signed)
Reminder in epic to follow up in 6 months °

## 2010-08-31 NOTE — Assessment & Plan Note (Addendum)
Dysplastic nodules on MRI NOV 2011.  Repeat MRI w/ EOVIST. OPV in 6 mos. Obtain labs from [facility]. Continue Nadolol for primary prophylaxis for varices.  CC: Dr. Jacqualine Mau

## 2010-08-31 NOTE — Progress Notes (Addendum)
Subjective:     Patient ID: Tiffany Hale, female   DOB: 06-Jul-1955, 55 y.o.   MRN: [id number]  PCP: Nobie Putnam  ADDITIONAL GI DOC: DR. ZACKS  HPI Intentionally lost weight from 166 lbs to 139 lbs. Pending Rx for HCV. No swelling in legs or abdomen. Has bloating. Appetite: good. Bms: one/d. Rare nausea. No vomiting. Rare EtOH-2 beers 6-7 mos ago.  Past Medical History  Diagnosis Date  . Depression     problems concentrating, MHS ([facility])  . Asthma   . Hepatitis     C: Hepatitis B Sab POS June 2009  . HCV (hepatitis C virus) 2009  . HBV (hepatitis B virus) infection 2009: HBsAb POS  . Hepatitis A 2009 Ab POS  . Tobacco abuse   . Cirrhosis NOV 2011    MRI LIVER-5 SML DYSPLASTIC NODULES; 2009: TBILI 0.7 ALK PHOS 128 AST 56 ALT 53 ALB 3.8 INR 1.3  . BMI (body mass index) 20.0-29.9 2010 155 LBS   Past Surgical History  Procedure Date  . Vesicovaginal fistula closure w/ tah 1995    DUB  . Extremity surgery     and Pelvic  . Esophagogastroduodenoscopy MAR 2011    Grade 1 varices, GASTRITIS  . Total abdominal hysterectomy 1995 DUB  . Colonoscopy OCT 2011     6 MM SIMPLE ADENOMA, BENIGN COLONIC MUCOSA   No Known Allergies Current Outpatient Prescriptions  Medication Sig Dispense Refill  . ARIPiprazole (ABILIFY) 15 MG tablet Take 15 mg by mouth daily.        . calcium carbonate 200 MG capsule Take 250 mg by mouth 2 (two) times daily with a meal.        . escitalopram (LEXAPRO) 20 MG tablet Take 20 mg by mouth daily.        . methylphenidate (CONCERTA) 36 MG CR tablet Take 36 mg by mouth every morning.        . nadolol (CORGARD) 20 MG tablet Take 20 mg by mouth daily.        . traZODone (DESYREL) 150 MG tablet Take 150 mg by mouth at bedtime.        Marland Kitchen amphetamine-dextroamphetamine (ADDERALL) 10 MG tablet Take 10 mg by mouth daily.        . budesonide-formoterol (SYMBICORT) 80-4.5 MCG/ACT inhaler Inhale 2 puffs into the lungs 2 (two) times daily.        Marland Kitchen omeprazole (PRILOSEC)  20 MG capsule Take 20 mg by mouth daily.        . promethazine (PHENERGAN) 25 MG tablet Take 25 mg by mouth every 6 (six) hours as needed.           Review of Systems  All other systems reviewed and are negative.       Objective:   Physical Exam  Vitals reviewed. Constitutional: She is oriented to person, place, and time. She appears well-developed and well-nourished. No distress.  HENT:  Head: Normocephalic and atraumatic.  Mouth/Throat: No oropharyngeal exudate.  Eyes: Conjunctivae are normal. Pupils are equal, round, and reactive to light.  Neck: Normal range of motion. Neck supple.  Cardiovascular: Normal rate, regular rhythm and normal heart sounds.   Pulmonary/Chest: Effort normal and breath sounds normal.  Abdominal: Soft. Bowel sounds are normal. She exhibits no distension. There is no tenderness.  Musculoskeletal: She exhibits no edema.  Lymphadenopathy:    She has no cervical adenopathy.  Neurological: She is oriented to person, place, and time.  Assessment:         Plan:          ADDENDUM 1610: FAXED REQUEST FOR LABS. [facility] stated they had no records for 2012.

## 2010-08-31 NOTE — Progress Notes (Signed)
Cc to PCP 

## 2010-09-06 ENCOUNTER — Other Ambulatory Visit: Payer: Self-pay | Admitting: Gastroenterology

## 2010-09-06 ENCOUNTER — Ambulatory Visit (HOSPITAL_COMMUNITY)
Admission: RE | Admit: 2010-09-06 | Discharge: 2010-09-06 | Disposition: A | Discharge status: Home / Self Care | Payer: Medicaid Other | Source: Ambulatory Visit | Attending: Gastroenterology | Admitting: Gastroenterology

## 2010-09-06 DIAGNOSIS — K7460 Unspecified cirrhosis of liver: Secondary | ICD-10-CM | POA: Insufficient documentation

## 2010-09-06 DIAGNOSIS — B1920 Unspecified viral hepatitis C without hepatic coma: Secondary | ICD-10-CM

## 2010-09-06 DIAGNOSIS — K769 Liver disease, unspecified: Secondary | ICD-10-CM | POA: Insufficient documentation

## 2010-09-06 MED ORDER — GADOBENATE DIMEGLUMINE 529 MG/ML IV SOLN: 14.0000 mL | Freq: Once | INTRAVENOUS | Status: AC | PRN

## 2010-09-09 ENCOUNTER — Ambulatory Visit (HOSPITAL_COMMUNITY): Payer: Medicaid Other

## 2010-11-10 ENCOUNTER — Encounter: Payer: Self-pay | Admitting: Gastroenterology

## 2010-12-29 LAB — CBC
HCT: 23.3 — ABNORMAL LOW
HCT: 26.6 — ABNORMAL LOW
HCT: 26.9 — ABNORMAL LOW
HCT: 27.2 — ABNORMAL LOW
HCT: 27.3 — ABNORMAL LOW
HCT: 27.8 — ABNORMAL LOW
HCT: 28 — ABNORMAL LOW
HCT: 28.1 — ABNORMAL LOW
HCT: 28.5 — ABNORMAL LOW
HCT: 28.5 — ABNORMAL LOW
HCT: 28.7 — ABNORMAL LOW
HCT: 29.1 — ABNORMAL LOW
HCT: 29.1 — ABNORMAL LOW
HCT: 30.3 — ABNORMAL LOW
HCT: 31.7 — ABNORMAL LOW
HCT: 31.8 — ABNORMAL LOW
HCT: 35.6 — ABNORMAL LOW
HCT: 22.9 — ABNORMAL LOW
HCT: 23.7 — ABNORMAL LOW
HCT: 24 — ABNORMAL LOW
HCT: 24.6 — ABNORMAL LOW
HCT: 24.7 — ABNORMAL LOW
HCT: 25.2 — ABNORMAL LOW
HCT: 25.7 — ABNORMAL LOW
HCT: 25.9 — ABNORMAL LOW
HCT: 26.4 — ABNORMAL LOW
HCT: 27 — ABNORMAL LOW
HCT: 27.8 — ABNORMAL LOW
Hemoglobin: 10 — ABNORMAL LOW
Hemoglobin: 10.3 — ABNORMAL LOW
Hemoglobin: 8.2 — ABNORMAL LOW
Hemoglobin: 8.7 — ABNORMAL LOW
Hemoglobin: 9.1 — ABNORMAL LOW
Hemoglobin: 9.1 — ABNORMAL LOW
Hemoglobin: 9.3 — ABNORMAL LOW
Hemoglobin: 9.4 — ABNORMAL LOW
Hemoglobin: 9.4 — ABNORMAL LOW
Hemoglobin: 9.4 — ABNORMAL LOW
Hemoglobin: 9.5 — ABNORMAL LOW
Hemoglobin: 9.6 — ABNORMAL LOW
Hemoglobin: 9.8 — ABNORMAL LOW
Hemoglobin: 9.9 — ABNORMAL LOW
Hemoglobin: 7.7 — CL
Hemoglobin: 7.7 — CL
Hemoglobin: 8 — ABNORMAL LOW
Hemoglobin: 8.2 — ABNORMAL LOW
Hemoglobin: 8.3 — ABNORMAL LOW
Hemoglobin: 8.4 — ABNORMAL LOW
Hemoglobin: 8.6 — ABNORMAL LOW
Hemoglobin: 9.2 — ABNORMAL LOW
MCHC: 32.4
MCHC: 32.6
MCHC: 32.9
MCHC: 33.4
MCHC: 33.6
MCHC: 33.6
MCHC: 34.2
MCHC: 34.3
MCHC: 34.4
MCHC: 34.4
MCHC: 34.5
MCHC: 34.5
MCHC: 35.1
MCHC: 32
MCHC: 32.4
MCHC: 32.4
MCHC: 32.5
MCHC: 32.7
MCHC: 33.1
MCHC: 33.2
MCHC: 33.5
MCV: 90.6
MCV: 91.3
MCV: 91.4
MCV: 91.4
MCV: 91.5
MCV: 91.6
MCV: 92
MCV: 92.3
MCV: 92.8
MCV: 93.3
MCV: 93.5
MCV: 94.9
MCV: 95.2
MCV: 97.4
MCV: 97.8
MCV: 92.3
MCV: 92.8
MCV: 92.9
MCV: 94.5
MCV: 95.6
MCV: 95.8
MCV: 96.9
MCV: 98.2
MCV: 99.1
Platelets: 101 — ABNORMAL LOW
Platelets: 105 — ABNORMAL LOW
Platelets: 105 — ABNORMAL LOW
Platelets: 118 — ABNORMAL LOW
Platelets: 123 — ABNORMAL LOW
Platelets: 125 — ABNORMAL LOW
Platelets: 134 — ABNORMAL LOW
Platelets: 144 — ABNORMAL LOW
Platelets: 145 — ABNORMAL LOW
Platelets: 146 — ABNORMAL LOW
Platelets: 157
Platelets: 159
Platelets: 164
Platelets: 253
Platelets: 266
Platelets: 275
Platelets: 92 — ABNORMAL LOW
Platelets: 100 — ABNORMAL LOW
Platelets: 132 — ABNORMAL LOW
Platelets: 173
Platelets: 178
Platelets: 180
Platelets: 92 — ABNORMAL LOW
Platelets: 93 — ABNORMAL LOW
RBC: 2.55 — ABNORMAL LOW
RBC: 2.6 — ABNORMAL LOW
RBC: 2.73 — ABNORMAL LOW
RBC: 2.92 — ABNORMAL LOW
RBC: 2.97 — ABNORMAL LOW
RBC: 2.99 — ABNORMAL LOW
RBC: 2.99 — ABNORMAL LOW
RBC: 3.01 — ABNORMAL LOW
RBC: 3.03 — ABNORMAL LOW
RBC: 3.03 — ABNORMAL LOW
RBC: 3.19 — ABNORMAL LOW
RBC: 3.25 — ABNORMAL LOW
RBC: 3.3 — ABNORMAL LOW
RBC: 3.65 — ABNORMAL LOW
RBC: 2.47 — ABNORMAL LOW
RBC: 2.5 — ABNORMAL LOW
RBC: 2.51 — ABNORMAL LOW
RBC: 2.54 — ABNORMAL LOW
RBC: 2.6 — ABNORMAL LOW
RBC: 2.82 — ABNORMAL LOW
RBC: 2.94 — ABNORMAL LOW
RDW: 16.1 — ABNORMAL HIGH
RDW: 16.2 — ABNORMAL HIGH
RDW: 16.8 — ABNORMAL HIGH
RDW: 17 — ABNORMAL HIGH
RDW: 17.1 — ABNORMAL HIGH
RDW: 17.4 — ABNORMAL HIGH
RDW: 17.8 — ABNORMAL HIGH
RDW: 18 — ABNORMAL HIGH
RDW: 18 — ABNORMAL HIGH
RDW: 18.4 — ABNORMAL HIGH
RDW: 18.4 — ABNORMAL HIGH
RDW: 18.9 — ABNORMAL HIGH
RDW: 18.9 — ABNORMAL HIGH
RDW: 19.3 — ABNORMAL HIGH
RDW: 19.4 — ABNORMAL HIGH
RDW: 18.5 — ABNORMAL HIGH
RDW: 18.6 — ABNORMAL HIGH
RDW: 18.6 — ABNORMAL HIGH
RDW: 18.9 — ABNORMAL HIGH
RDW: 20.5 — ABNORMAL HIGH
RDW: 20.9 — ABNORMAL HIGH
RDW: 21.1 — ABNORMAL HIGH
WBC: 10
WBC: 10
WBC: 10.1
WBC: 10.2
WBC: 10.5
WBC: 10.8 — ABNORMAL HIGH
WBC: 13.2 — ABNORMAL HIGH
WBC: 17.6 — ABNORMAL HIGH
WBC: 4.6
WBC: 5.5
WBC: 5.8
WBC: 5.9
WBC: 6.5
WBC: 7.6
WBC: 7.8
WBC: 9.3
WBC: 4.7
WBC: 4.9
WBC: 5.6
WBC: 6
WBC: 6.1
WBC: 8
WBC: 9.1

## 2010-12-29 LAB — BLOOD GAS, ARTERIAL
Acid-Base Excess: 0.6
Acid-Base Excess: 0.7
Acid-Base Excess: 1.4
Acid-Base Excess: 1.9
Acid-Base Excess: 10.4 — ABNORMAL HIGH
Acid-Base Excess: 10.7 — ABNORMAL HIGH
Acid-Base Excess: 14.4 — ABNORMAL HIGH
Acid-Base Excess: 15 — ABNORMAL HIGH
Acid-Base Excess: 16.2 — ABNORMAL HIGH
Acid-Base Excess: 17.4 — ABNORMAL HIGH
Acid-Base Excess: 2.7 — ABNORMAL HIGH
Acid-Base Excess: 2.7 — ABNORMAL HIGH
Acid-Base Excess: 3.2 — ABNORMAL HIGH
Acid-Base Excess: 4.3 — ABNORMAL HIGH
Acid-Base Excess: 6 — ABNORMAL HIGH
Acid-Base Excess: 7.1 — ABNORMAL HIGH
Acid-Base Excess: 8.8 — ABNORMAL HIGH
Bicarbonate: 26.2 — ABNORMAL HIGH
Bicarbonate: 26.5 — ABNORMAL HIGH
Bicarbonate: 26.5 — ABNORMAL HIGH
Bicarbonate: 28.1 — ABNORMAL HIGH
Bicarbonate: 29.4 — ABNORMAL HIGH
Bicarbonate: 32.1 — ABNORMAL HIGH
Bicarbonate: 33.1 — ABNORMAL HIGH
Bicarbonate: 33.8 — ABNORMAL HIGH
Bicarbonate: 39.8 — ABNORMAL HIGH
Bicarbonate: 39.8 — ABNORMAL HIGH
Bicarbonate: 40 — ABNORMAL HIGH
Bicarbonate: 44 — ABNORMAL HIGH
Bicarbonate: 45.8 — ABNORMAL HIGH
Drawn by: 129711
Drawn by: 24436
Drawn by: 24486
Drawn by: 24486
Drawn by: 24486
Drawn by: 24513
Drawn by: 245131
FIO2: 0.7
FIO2: 0.8
FIO2: 0.8
FIO2: 0.9
FIO2: 0.9
FIO2: 0.9
FIO2: 0.9
FIO2: 0.9
FIO2: 0.9
FIO2: 0.9
FIO2: 100
FIO2: 70
FIO2: 90
FIO2: 90
FIO2: 90
FIO2: 90
FIO2: 90
MECHVT: 0.33
MECHVT: 320
MECHVT: 330
MECHVT: 330
MECHVT: 330
MECHVT: 330
MECHVT: 380
MECHVT: 380
MECHVT: 380
MECHVT: 380
MECHVT: 380
MECHVT: 440
MECHVT: 440
MECHVT: 440
MECHVT: 440
O2 Content: 90
O2 Saturation: 84.7
O2 Saturation: 89.3
O2 Saturation: 91
O2 Saturation: 93.1
O2 Saturation: 94.2
O2 Saturation: 95.4
O2 Saturation: 95.4
O2 Saturation: 97
O2 Saturation: 97.6
O2 Saturation: 99.1
PEEP: 14
PEEP: 15
PEEP: 15
PEEP: 16
PEEP: 16
PEEP: 18
PEEP: 18
PEEP: 20
Patient temperature: 100.2
Patient temperature: 100.5
Patient temperature: 100.7
Patient temperature: 100.7
Patient temperature: 100.7
Patient temperature: 101.4
Patient temperature: 97.8
Patient temperature: 98.6
Patient temperature: 98.6
Patient temperature: 98.6
Patient temperature: 98.6
Patient temperature: 99.5
Patient temperature: 99.9
Pressure control: 14
Pressure control: 14
Pressure support: 0
RATE: 18
RATE: 20
RATE: 22
RATE: 35
RATE: 35
RATE: 35
RATE: 35
RATE: 35
RATE: 35
RATE: 35
RATE: 35
TCO2: 27.8
TCO2: 27.8
TCO2: 28
TCO2: 28.3
TCO2: 31.5
TCO2: 32.1
TCO2: 32.5
TCO2: 33.6
TCO2: 34.7
TCO2: 35.5
TCO2: 40
TCO2: 41.7
TCO2: 41.7
TCO2: 42.4
TCO2: 45.4
TCO2: 48.1
pCO2 arterial: 100
pCO2 arterial: 38.1
pCO2 arterial: 41.5
pCO2 arterial: 46.3 — ABNORMAL HIGH
pCO2 arterial: 47 — ABNORMAL HIGH
pCO2 arterial: 50.6 — ABNORMAL HIGH
pCO2 arterial: 52.3 — ABNORMAL HIGH
pCO2 arterial: 53.1 — ABNORMAL HIGH
pCO2 arterial: 53.4 — ABNORMAL HIGH
pCO2 arterial: 56.4 — ABNORMAL HIGH
pCO2 arterial: 64.8
pCO2 arterial: 69.2
pCO2 arterial: 84.9
pCO2 arterial: 96.6
pCO2 arterial: 97.4
pH, Arterial: 7.076 — CL
pH, Arterial: 7.22 — ABNORMAL LOW
pH, Arterial: 7.266 — ABNORMAL LOW
pH, Arterial: 7.298 — ABNORMAL LOW
pH, Arterial: 7.312 — ABNORMAL LOW
pH, Arterial: 7.325 — ABNORMAL LOW
pH, Arterial: 7.363
pH, Arterial: 7.371
pH, Arterial: 7.391
pH, Arterial: 7.395
pH, Arterial: 7.401 — ABNORMAL HIGH
pH, Arterial: 7.408 — ABNORMAL HIGH
pH, Arterial: 7.41 — ABNORMAL HIGH
pH, Arterial: 7.412 — ABNORMAL HIGH
pH, Arterial: 7.425 — ABNORMAL HIGH
pH, Arterial: 7.425 — ABNORMAL HIGH
pO2, Arterial: 109 — ABNORMAL HIGH
pO2, Arterial: 144 — ABNORMAL HIGH
pO2, Arterial: 53.4 — ABNORMAL LOW
pO2, Arterial: 54.9 — ABNORMAL LOW
pO2, Arterial: 65.9 — ABNORMAL LOW
pO2, Arterial: 67.8 — ABNORMAL LOW
pO2, Arterial: 69.4 — ABNORMAL LOW
pO2, Arterial: 75.6 — ABNORMAL LOW
pO2, Arterial: 75.9 — ABNORMAL LOW
pO2, Arterial: 91
pO2, Arterial: 98.1

## 2010-12-29 LAB — POCT I-STAT 3, ART BLOOD GAS (G3+)
Acid-Base Excess: 26 — ABNORMAL HIGH
Acid-Base Excess: 29 — ABNORMAL HIGH
Acid-Base Excess: 14 — ABNORMAL HIGH
Acid-Base Excess: 17 — ABNORMAL HIGH
Acid-Base Excess: 21 — ABNORMAL HIGH
Acid-Base Excess: 21 — ABNORMAL HIGH
Acid-Base Excess: 23 — ABNORMAL HIGH
Bicarbonate: 54.5 — ABNORMAL HIGH
Bicarbonate: 55.9 — ABNORMAL HIGH
Bicarbonate: 55.9 — ABNORMAL HIGH
Bicarbonate: 38 — ABNORMAL HIGH
Bicarbonate: 44.5 — ABNORMAL HIGH
Bicarbonate: 45.4 — ABNORMAL HIGH
Bicarbonate: 46.8 — ABNORMAL HIGH
Bicarbonate: 48.3 — ABNORMAL HIGH
O2 Saturation: 92
O2 Saturation: 95
O2 Saturation: 96
O2 Saturation: 97
O2 Saturation: 97
O2 Saturation: 97
O2 Saturation: 98
Operator id: 261491
Operator id: 270161
Operator id: 270221
Operator id: 280991
Operator id: 280991
Operator id: 285161
Operator id: 287601
Operator id: 221591
Operator id: 270161
Operator id: 285161
Patient temperature: 99.5
Patient temperature: 100.7
Patient temperature: 98.4
Patient temperature: 98.7
Patient temperature: 99
Patient temperature: 99.2
TCO2: >50
TCO2: >50
TCO2: >50
TCO2: 39
TCO2: 46
TCO2: 48
TCO2: >50
pCO2 arterial: 102.6
pCO2 arterial: 113.1
pCO2 arterial: 65.8
pCO2 arterial: 67
pCO2 arterial: 70.8
pCO2 arterial: 89.1
pCO2 arterial: 63.9
pCO2 arterial: 65.6
pH, Arterial: 7.335 — ABNORMAL LOW
pH, Arterial: 7.344 — ABNORMAL LOW
pH, Arterial: 7.428 — ABNORMAL HIGH
pH, Arterial: 7.504 — ABNORMAL HIGH
pH, Arterial: 7.523 — ABNORMAL HIGH
pH, Arterial: 7.439 — ABNORMAL HIGH
pH, Arterial: 7.487 — ABNORMAL HIGH
pO2, Arterial: 57 — ABNORMAL LOW
pO2, Arterial: 57 — ABNORMAL LOW
pO2, Arterial: 65 — ABNORMAL LOW
pO2, Arterial: 68 — ABNORMAL LOW
pO2, Arterial: 77 — ABNORMAL LOW
pO2, Arterial: 109 — ABNORMAL HIGH
pO2, Arterial: 111 — ABNORMAL HIGH
pO2, Arterial: 86
pO2, Arterial: 90

## 2010-12-29 LAB — COMPREHENSIVE METABOLIC PANEL
ALT: 31
AST: 62 — ABNORMAL HIGH
AST: 86 — ABNORMAL HIGH
Albumin: 1.3 — ABNORMAL LOW
Albumin: 1.5 — ABNORMAL LOW
Albumin: 1.8 — ABNORMAL LOW
Alkaline Phosphatase: 103
Alkaline Phosphatase: 114
BUN: 19
BUN: 28 — ABNORMAL HIGH
BUN: 5 — ABNORMAL LOW
CO2: 41 — ABNORMAL HIGH
CO2: >45 — ABNORMAL HIGH
Calcium: 8 — ABNORMAL LOW
Calcium: 8 — ABNORMAL LOW
Chloride: 107
Chloride: 115 — ABNORMAL HIGH
Creatinine, Ser: 0.67
GFR calc Af Amer: >60
GFR calc Af Amer: >60
GFR calc non Af Amer: >60
GFR calc non Af Amer: >60
Glucose, Bld: 111 — ABNORMAL HIGH
Glucose, Bld: 151 — ABNORMAL HIGH
Potassium: 3 — ABNORMAL LOW
Potassium: 3.3 — ABNORMAL LOW
Sodium: 128 — ABNORMAL LOW
Sodium: 148 — ABNORMAL HIGH
Total Bilirubin: 0.8
Total Bilirubin: 1
Total Bilirubin: 0.6
Total Protein: 6.1
Total Protein: 6.4

## 2010-12-29 LAB — BASIC METABOLIC PANEL
BUN: 13
BUN: 20
BUN: 23
BUN: 25 — ABNORMAL HIGH
BUN: 27 — ABNORMAL HIGH
BUN: 28 — ABNORMAL HIGH
BUN: 7
BUN: 26 — ABNORMAL HIGH
BUN: 31 — ABNORMAL HIGH
BUN: 32 — ABNORMAL HIGH
BUN: 34 — ABNORMAL HIGH
BUN: 4 — ABNORMAL LOW
BUN: 41 — ABNORMAL HIGH
BUN: 47 — ABNORMAL HIGH
BUN: 49 — ABNORMAL HIGH
CO2: 27
CO2: 27
CO2: 27
CO2: 32
CO2: 34 — ABNORMAL HIGH
CO2: >45 — ABNORMAL HIGH
CO2: 21
CO2: 22
CO2: 25
CO2: 35 — ABNORMAL HIGH
CO2: 40 — ABNORMAL HIGH
CO2: 42 — ABNORMAL HIGH
CO2: 42 — ABNORMAL HIGH
CO2: >45 — ABNORMAL HIGH
CO2: >45 — ABNORMAL HIGH
Calcium: 7.3 — ABNORMAL LOW
Calcium: 7.5 — ABNORMAL LOW
Calcium: 7.6 — ABNORMAL LOW
Calcium: 8.2 — ABNORMAL LOW
Calcium: 7.7 — ABNORMAL LOW
Calcium: 7.8 — ABNORMAL LOW
Calcium: 7.9 — ABNORMAL LOW
Calcium: 8 — ABNORMAL LOW
Calcium: 8 — ABNORMAL LOW
Chloride: 104
Chloride: 105
Chloride: 112
Chloride: 116 — ABNORMAL HIGH
Chloride: 94 — ABNORMAL LOW
Chloride: 97
Chloride: 99
Chloride: 100
Chloride: 100
Chloride: 102
Chloride: 112
Chloride: 113 — ABNORMAL HIGH
Chloride: 125 — ABNORMAL HIGH
Chloride: 130 — ABNORMAL HIGH
Chloride: 95 — ABNORMAL LOW
Chloride: 97
Chloride: 98
Chloride: 99
Creatinine, Ser: 0.6
Creatinine, Ser: 0.61
Creatinine, Ser: 0.62
Creatinine, Ser: 0.63
Creatinine, Ser: 0.63
Creatinine, Ser: 0.66
Creatinine, Ser: 0.67
Creatinine, Ser: 0.93
Creatinine, Ser: 0.6
Creatinine, Ser: 0.61
Creatinine, Ser: 0.63
Creatinine, Ser: 0.72
Creatinine, Ser: 0.72
Creatinine, Ser: 0.84
Creatinine, Ser: 0.86
GFR calc Af Amer: >60
GFR calc Af Amer: >60
GFR calc Af Amer: >60
GFR calc Af Amer: >60
GFR calc Af Amer: >60
GFR calc Af Amer: >60
GFR calc Af Amer: >60
GFR calc Af Amer: >60
GFR calc Af Amer: >60
GFR calc Af Amer: >60
GFR calc Af Amer: >60
GFR calc Af Amer: >60
GFR calc Af Amer: >60
GFR calc non Af Amer: 54 — ABNORMAL LOW
GFR calc non Af Amer: >60
GFR calc non Af Amer: >60
GFR calc non Af Amer: >60
GFR calc non Af Amer: >60
GFR calc non Af Amer: >60
GFR calc non Af Amer: >60
GFR calc non Af Amer: >60
GFR calc non Af Amer: >60
GFR calc non Af Amer: >60
GFR calc non Af Amer: >60
GFR calc non Af Amer: >60
GFR calc non Af Amer: >60
GFR calc non Af Amer: >60
Glucose, Bld: 112 — ABNORMAL HIGH
Glucose, Bld: 116 — ABNORMAL HIGH
Glucose, Bld: 119 — ABNORMAL HIGH
Glucose, Bld: 122 — ABNORMAL HIGH
Glucose, Bld: 140 — ABNORMAL HIGH
Glucose, Bld: 140 — ABNORMAL HIGH
Glucose, Bld: 151 — ABNORMAL HIGH
Glucose, Bld: 101 — ABNORMAL HIGH
Glucose, Bld: 122 — ABNORMAL HIGH
Glucose, Bld: 133 — ABNORMAL HIGH
Glucose, Bld: 142 — ABNORMAL HIGH
Glucose, Bld: 143 — ABNORMAL HIGH
Glucose, Bld: 159 — ABNORMAL HIGH
Glucose, Bld: 91
Glucose, Bld: 97
Glucose, Bld: 97
Potassium: 3.2 — ABNORMAL LOW
Potassium: 3.2 — ABNORMAL LOW
Potassium: 3.5
Potassium: 3.6
Potassium: 3.6
Potassium: 4
Potassium: 4.2
Potassium: 4.7
Potassium: 5.1
Potassium: 2.8 — ABNORMAL LOW
Potassium: 2.8 — ABNORMAL LOW
Potassium: 2.8 — ABNORMAL LOW
Potassium: 2.9 — ABNORMAL LOW
Potassium: 3.1 — ABNORMAL LOW
Potassium: 3.4 — ABNORMAL LOW
Potassium: 3.5
Potassium: 3.6
Potassium: 3.7
Potassium: 3.7
Sodium: 140
Sodium: 148 — ABNORMAL HIGH
Sodium: 148 — ABNORMAL HIGH
Sodium: 149 — ABNORMAL HIGH
Sodium: 149 — ABNORMAL HIGH
Sodium: 151 — ABNORMAL HIGH
Sodium: 151 — ABNORMAL HIGH
Sodium: 139
Sodium: 140
Sodium: 144
Sodium: 145
Sodium: 146 — ABNORMAL HIGH
Sodium: 146 — ABNORMAL HIGH
Sodium: 157 — ABNORMAL HIGH
Sodium: 160 — ABNORMAL HIGH

## 2010-12-29 LAB — URINALYSIS, ROUTINE W REFLEX MICROSCOPIC
Bilirubin Urine: NEGATIVE
Glucose, UA: NEGATIVE
Glucose, UA: NEGATIVE
Hgb urine dipstick: NEGATIVE
Ketones, ur: 40 — AB
Ketones, ur: NEGATIVE
Nitrite: NEGATIVE
Nitrite: NEGATIVE
Protein, ur: NEGATIVE
Protein, ur: NEGATIVE
Specific Gravity, Urine: 1.02
Specific Gravity, Urine: 1.011
Specific Gravity, Urine: 1.018
Urobilinogen, UA: 0.2
Urobilinogen, UA: 1
pH: 6
pH: 6

## 2010-12-29 LAB — CALCIUM, IONIZED: Calcium, Ion: 1.19

## 2010-12-29 LAB — CLOSTRIDIUM DIFFICILE EIA
C difficile Toxins A+B, EIA: NEGATIVE
C difficile Toxins A+B, EIA: NEGATIVE
C difficile Toxins A+B, EIA: NEGATIVE

## 2010-12-29 LAB — CULTURE, BLOOD (ROUTINE X 2)
Culture: NO GROWTH
Culture: NO GROWTH
Culture: NO GROWTH

## 2010-12-29 LAB — CULTURE, RESPIRATORY W GRAM STAIN

## 2010-12-29 LAB — VANCOMYCIN, TROUGH
Vancomycin Tr: 9.7
Vancomycin Tr: 28.5

## 2010-12-29 LAB — DIFFERENTIAL
Basophils Relative: 0
Basophils Relative: 0
Eosinophils Absolute: 0
Eosinophils Absolute: 0.2
Eosinophils Absolute: 0.2
Eosinophils Relative: 2
Eosinophils Relative: 3
Lymphs Abs: 0.7
Lymphs Abs: 0.7
Monocytes Relative: 3
Monocytes Relative: 5
Neutrophils Relative %: 88 — ABNORMAL HIGH
Neutrophils Relative %: 90 — ABNORMAL HIGH
Neutrophils Relative %: 78 — ABNORMAL HIGH

## 2010-12-29 LAB — URINE CULTURE
Colony Count: NO GROWTH
Colony Count: 2000

## 2010-12-29 LAB — CULTURE, BAL-QUANTITATIVE W GRAM STAIN: Colony Count: NO GROWTH

## 2010-12-29 LAB — INFLUENZA A+B VIRUS AG-DIRECT(RAPID): Influenza B Ag: NEGATIVE

## 2010-12-29 LAB — MAGNESIUM
Magnesium: 2.3
Magnesium: 2.4
Magnesium: 2.7 — ABNORMAL HIGH

## 2010-12-29 LAB — PHOSPHORUS
Phosphorus: 4.3
Phosphorus: 5.4 — ABNORMAL HIGH

## 2010-12-29 LAB — LEGIONELLA ANTIGEN, URINE

## 2010-12-29 LAB — FUNGUS CULTURE, BLOOD: Culture: NO GROWTH

## 2010-12-29 LAB — AMMONIA
Ammonia: 52 — ABNORMAL HIGH
Ammonia: 55 — ABNORMAL HIGH

## 2010-12-29 LAB — CARBOXYHEMOGLOBIN: Carboxyhemoglobin: 1.5

## 2010-12-29 LAB — POTASSIUM: Potassium: 2.8 — ABNORMAL LOW

## 2010-12-29 LAB — IRON AND TIBC
Iron: 22 — ABNORMAL LOW
UIBC: 140

## 2010-12-29 LAB — CROSSMATCH

## 2010-12-29 LAB — HEMOGLOBIN A1C
Hgb A1c MFr Bld: 4.3 — ABNORMAL LOW
Mean Plasma Glucose: 76

## 2010-12-29 LAB — HEPATITIS A ANTIBODY, TOTAL: Hep A Total Ab: POSITIVE — AB

## 2010-12-29 LAB — URINE MICROSCOPIC-ADD ON

## 2010-12-29 LAB — STREP PNEUMONIAE URINARY ANTIGEN: Strep Pneumo Urinary Antigen: NEGATIVE

## 2010-12-29 LAB — FOLATE: Folate: >20

## 2010-12-29 LAB — FERRITIN: Ferritin: 900 — ABNORMAL HIGH (ref 10–291)

## 2010-12-29 LAB — CARDIAC PANEL(CRET KIN+CKTOT+MB+TROPI): Troponin I: 0.04

## 2010-12-29 LAB — RETICULOCYTES: Retic Ct Pct: 2

## 2010-12-29 LAB — HEPATITIS C ANTIBODY: HCV Ab: POSITIVE — AB

## 2010-12-29 LAB — TSH: TSH: 2.616

## 2010-12-30 LAB — CBC
HCT: 24.2 — ABNORMAL LOW
Platelets: 94 — ABNORMAL LOW
RDW: 18.2 — ABNORMAL HIGH

## 2011-01-05 LAB — CBC
HCT: 25.3 — ABNORMAL LOW
HCT: 29.5 — ABNORMAL LOW
Hemoglobin: 14
Hemoglobin: 8.9 — ABNORMAL LOW
MCHC: 34.6
MCV: 92.4
MCV: 92.5
Platelets: 50 — ABNORMAL LOW
Platelets: 61 — ABNORMAL LOW
RBC: 2.73 — ABNORMAL LOW
RBC: 4.37
WBC: 3.9 — ABNORMAL LOW
WBC: 5.6
WBC: 5.7

## 2011-01-05 LAB — BASIC METABOLIC PANEL
BUN: 2 — ABNORMAL LOW
BUN: 5 — ABNORMAL LOW
Calcium: 8.5
Chloride: 104
GFR calc non Af Amer: >60
GFR calc non Af Amer: >60
Potassium: 3.7
Potassium: 3.9
Sodium: 135
Sodium: 138

## 2011-01-05 LAB — COMPREHENSIVE METABOLIC PANEL
ALT: 53 — ABNORMAL HIGH
AST: 56 — ABNORMAL HIGH
Albumin: 3.8
CO2: 28
Calcium: 9.8
GFR calc Af Amer: >60
Sodium: 138
Total Protein: 8.6 — ABNORMAL HIGH

## 2011-01-05 LAB — URINALYSIS, ROUTINE W REFLEX MICROSCOPIC
Glucose, UA: NEGATIVE
Hgb urine dipstick: NEGATIVE
Ketones, ur: NEGATIVE
Protein, ur: NEGATIVE
pH: 6.5

## 2011-01-05 LAB — DIFFERENTIAL
Eosinophils Absolute: 0.1
Eosinophils Relative: 2
Lymphs Abs: 1.7
Monocytes Absolute: 0.4
Monocytes Relative: 6

## 2011-01-05 LAB — URINE CULTURE: Colony Count: 7000

## 2011-01-11 ENCOUNTER — Encounter: Payer: Self-pay | Admitting: Gastroenterology

## 2011-01-13 LAB — POCT I-STAT CREATININE
Creatinine, Ser: 0.6
Operator id: 196461

## 2011-01-13 LAB — CBC
HCT: 23.4 — ABNORMAL LOW
HCT: 23.4 — ABNORMAL LOW
HCT: 24.8 — ABNORMAL LOW
HCT: 25.2 — ABNORMAL LOW
HCT: 25.2 — ABNORMAL LOW
HCT: 25.5 — ABNORMAL LOW
HCT: 25.5 — ABNORMAL LOW
HCT: 25.9 — ABNORMAL LOW
HCT: 26 — ABNORMAL LOW
HCT: 26.1 — ABNORMAL LOW
HCT: 26.4 — ABNORMAL LOW
HCT: 26.6 — ABNORMAL LOW
HCT: 26.8 — ABNORMAL LOW
HCT: 26.9 — ABNORMAL LOW
HCT: 26.9 — ABNORMAL LOW
HCT: 27.3 — ABNORMAL LOW
HCT: 27.6 — ABNORMAL LOW
HCT: 28.1 — ABNORMAL LOW
HCT: 36.3
Hemoglobin: 7.7 — CL
Hemoglobin: 8.1 — ABNORMAL LOW
Hemoglobin: 8.1 — ABNORMAL LOW
Hemoglobin: 8.6 — ABNORMAL LOW
Hemoglobin: 8.8 — ABNORMAL LOW
Hemoglobin: 8.8 — ABNORMAL LOW
Hemoglobin: 8.9 — ABNORMAL LOW
Hemoglobin: 8.9 — ABNORMAL LOW
Hemoglobin: 8.9 — ABNORMAL LOW
Hemoglobin: 9.1 — ABNORMAL LOW
Hemoglobin: 9.1 — ABNORMAL LOW
Hemoglobin: 9.1 — ABNORMAL LOW
Hemoglobin: 9.2 — ABNORMAL LOW
Hemoglobin: 9.2 — ABNORMAL LOW
Hemoglobin: 9.3 — ABNORMAL LOW
Hemoglobin: 9.3 — ABNORMAL LOW
Hemoglobin: 9.4 — ABNORMAL LOW
Hemoglobin: 9.5 — ABNORMAL LOW
Hemoglobin: 9.7 — ABNORMAL LOW
Hemoglobin: 9.7 — ABNORMAL LOW
MCHC: 32.9
MCHC: 33.5
MCHC: 33.5
MCHC: 33.6
MCHC: 33.6
MCHC: 33.9
MCHC: 33.9
MCHC: 33.9
MCHC: 34
MCHC: 34.2
MCHC: 34.3
MCHC: 34.5
MCHC: 34.5
MCHC: 34.6
MCHC: 34.7
MCHC: 34.7
MCHC: 34.7
MCHC: 34.8
MCV: 88.3
MCV: 88.8
MCV: 89.6
MCV: 89.9
MCV: 90
MCV: 90.1
MCV: 90.1
MCV: 90.2
MCV: 90.5
MCV: 90.5
MCV: 90.7
MCV: 90.8
MCV: 90.9
MCV: 91.1
MCV: 91.4
Platelets: 109 — ABNORMAL LOW
Platelets: 118 — ABNORMAL LOW
Platelets: 125 — ABNORMAL LOW
Platelets: 125 — ABNORMAL LOW
Platelets: 165
Platelets: 167
Platelets: 175
Platelets: 185
Platelets: 215
Platelets: 289
Platelets: 64 — ABNORMAL LOW
Platelets: 66 — ABNORMAL LOW
Platelets: 69 — ABNORMAL LOW
Platelets: 72 — ABNORMAL LOW
Platelets: 96 — ABNORMAL LOW
Platelets: 97 — ABNORMAL LOW
RBC: 2.46 — ABNORMAL LOW
RBC: 2.59 — ABNORMAL LOW
RBC: 2.83 — ABNORMAL LOW
RBC: 2.84 — ABNORMAL LOW
RBC: 2.88 — ABNORMAL LOW
RBC: 2.91 — ABNORMAL LOW
RBC: 2.93 — ABNORMAL LOW
RBC: 2.93 — ABNORMAL LOW
RBC: 2.99 — ABNORMAL LOW
RBC: 3.01 — ABNORMAL LOW
RBC: 3.03 — ABNORMAL LOW
RBC: 3.03 — ABNORMAL LOW
RBC: 3.03 — ABNORMAL LOW
RBC: 3.04 — ABNORMAL LOW
RBC: 3.08 — ABNORMAL LOW
RBC: 3.09 — ABNORMAL LOW
RBC: 3.1 — ABNORMAL LOW
RBC: 3.18 — ABNORMAL LOW
RDW: 14
RDW: 14.2
RDW: 14.3
RDW: 14.4
RDW: 14.9
RDW: 14.9
RDW: 15
RDW: 15
RDW: 15.1
RDW: 15.1
RDW: 15.1
RDW: 15.2
RDW: 15.3
RDW: 15.4
RDW: 15.4
RDW: 16 — ABNORMAL HIGH
RDW: 16.4 — ABNORMAL HIGH
WBC: 10.4
WBC: 10.5
WBC: 14.3 — ABNORMAL HIGH
WBC: 15.6 — ABNORMAL HIGH
WBC: 17.5 — ABNORMAL HIGH
WBC: 6.6
WBC: 6.6
WBC: 7
WBC: 7.3
WBC: 7.4
WBC: 7.5
WBC: 8
WBC: 8.1
WBC: 8.2
WBC: 8.3
WBC: 8.8
WBC: 8.9
WBC: 9.3
WBC: 9.4
WBC: 9.7

## 2011-01-13 LAB — BASIC METABOLIC PANEL
BUN: 9
BUN: 9
CO2: 25
CO2: 26
Calcium: 8.1 — ABNORMAL LOW
Calcium: 8.2 — ABNORMAL LOW
Chloride: 105
Chloride: 94 — ABNORMAL LOW
Creatinine, Ser: 0.61
GFR calc Af Amer: >60
GFR calc Af Amer: >60
GFR calc Af Amer: >60
Glucose, Bld: 119 — ABNORMAL HIGH
Glucose, Bld: 94
Potassium: 3.4 — ABNORMAL LOW
Potassium: 4
Potassium: 4.7
Sodium: 140

## 2011-01-13 LAB — RAPID URINE DRUG SCREEN, HOSP PERFORMED
Amphetamines: NOT DETECTED
Cocaine: NOT DETECTED
Opiates: POSITIVE — AB
Tetrahydrocannabinol: NOT DETECTED

## 2011-01-13 LAB — I-STAT 8, (EC8 V) (CONVERTED LAB)
BUN: 11
Chloride: 108
Glucose, Bld: 123 — ABNORMAL HIGH
Potassium: 4.4
pCO2, Ven: 31 — ABNORMAL LOW
pH, Ven: 7.462 — ABNORMAL HIGH

## 2011-01-13 LAB — POCT PREGNANCY, URINE
Operator id: 196461
Preg Test, Ur: NEGATIVE

## 2011-01-13 LAB — CROSSMATCH: Antibody Screen: NEGATIVE

## 2011-01-13 LAB — URINALYSIS, ROUTINE W REFLEX MICROSCOPIC
Bilirubin Urine: NEGATIVE
Hgb urine dipstick: NEGATIVE
Ketones, ur: NEGATIVE
Nitrite: NEGATIVE
Specific Gravity, Urine: 1.029
Urobilinogen, UA: 1

## 2011-01-13 LAB — RETICULOCYTES
RBC.: 3.25 — ABNORMAL LOW
RBC.: 3.3 — ABNORMAL LOW
Retic Ct Pct: 3.7 — ABNORMAL HIGH
Retic Ct Pct: 7.9 — ABNORMAL HIGH

## 2011-01-13 LAB — PROTIME-INR
INR: 1.3
INR: 1.4
Prothrombin Time: 15.2

## 2011-01-13 LAB — APTT: aPTT: 29

## 2011-01-13 LAB — IRON AND TIBC
Saturation Ratios: 12 — ABNORMAL LOW
UIBC: 181

## 2011-01-13 LAB — PREPARE PLATELET PHERESIS

## 2011-01-13 LAB — SAMPLE TO BLOOD BANK

## 2011-01-13 LAB — FOLATE RBC: RBC Folate: 1043 — ABNORMAL HIGH

## 2011-01-13 LAB — FERRITIN: Ferritin: 295 — ABNORMAL HIGH (ref 10–291)

## 2011-09-08 ENCOUNTER — Telehealth: Payer: Self-pay | Admitting: Gastroenterology

## 2011-09-08 NOTE — Telephone Encounter (Signed)
June recall has patient is due for repeat MRI in one year

## 2011-09-11 NOTE — Telephone Encounter (Signed)
Orders is already put in by Dr. Jacqualine Mau office

## 2012-04-09 ENCOUNTER — Telehealth: Payer: Self-pay | Admitting: *Deleted

## 2012-04-09 NOTE — Telephone Encounter (Signed)
I am f/u on patient I have called Dr. Jacqualine Mau and Im waiting for a phone call back to see if patient is F/U there

## 2012-04-09 NOTE — Telephone Encounter (Signed)
Tiffany Hale was on the recall for June 2013 to have a repeat MRI in one year. The last MRI was in 2012. Thanks.

## 2012-04-15 ENCOUNTER — Encounter: Payer: Self-pay | Admitting: Gastroenterology

## 2012-04-15 NOTE — Telephone Encounter (Signed)
Dr. Jacqualine Mau office cant reach the patient we have been unsuccessful reaching her so I am send a letter for the patient to contact our office ASAP

## 2012-04-25 ENCOUNTER — Other Ambulatory Visit: Payer: Self-pay | Admitting: Gastroenterology

## 2012-04-25 DIAGNOSIS — B1920 Unspecified viral hepatitis C without hepatic coma: Secondary | ICD-10-CM

## 2012-04-30 ENCOUNTER — Ambulatory Visit (HOSPITAL_COMMUNITY)
Admission: RE | Admit: 2012-04-30 | Discharge: 2012-04-30 | Disposition: A | Discharge status: Home / Self Care | Payer: Medicaid Other | Source: Ambulatory Visit | Attending: Gastroenterology | Admitting: Gastroenterology

## 2012-04-30 DIAGNOSIS — B1920 Unspecified viral hepatitis C without hepatic coma: Secondary | ICD-10-CM | POA: Insufficient documentation

## 2012-04-30 DIAGNOSIS — K7460 Unspecified cirrhosis of liver: Secondary | ICD-10-CM | POA: Insufficient documentation

## 2012-04-30 DIAGNOSIS — R161 Splenomegaly, not elsewhere classified: Secondary | ICD-10-CM | POA: Insufficient documentation

## 2012-04-30 LAB — POCT I-STAT, CHEM 8
BUN: 14 mg/dL (ref 6–23)
Chloride: 108 mEq/L (ref 96–112)
Creatinine, Ser: 0.7 mg/dL (ref 0.50–1.10)
Sodium: 142 mEq/L (ref 135–145)
TCO2: 27 mmol/L (ref 0–100)

## 2012-04-30 MED ORDER — GADOBENATE DIMEGLUMINE 529 MG/ML IV SOLN
12.0000 mL | Freq: Once | INTRAVENOUS | Status: AC | PRN
  Administered 2012-04-30: 12 mL via INTRAVENOUS

## 2012-05-03 NOTE — Telephone Encounter (Signed)
Called pt's home. They told me to call her cell. Called and LMOM to call.

## 2012-05-03 NOTE — Telephone Encounter (Addendum)
PLEASE CALL PT.  HER MRI SHOWS CIRRHOSIS. SHE NEEDS AN OPV WITH DR. FIELDS OR AN EXTENDER W/I THE NEXT MONTH, DX: CIRRHOSIS. LAST VISIT WITH [facility] WAS ~2 YEARS AGO. SHE ALSO NEEDS TO SEE HER PCP DUE TO AN ELEVATED CALCIUM.

## 2012-05-06 NOTE — Telephone Encounter (Signed)
Results faxed to PCP, appt made 2-17 

## 2012-05-06 NOTE — Telephone Encounter (Signed)
Called home. Was told to call pt's cell. Called and LMOM to call.

## 2012-05-06 NOTE — Telephone Encounter (Signed)
Pt called back and was informed of results. She sees the group at [facility] and will call them in reference to her Calcium. Please send copy of lab to them.

## 2012-05-06 NOTE — Telephone Encounter (Signed)
Results faxed to PCP, appt made 2-17

## 2012-05-08 ENCOUNTER — Other Ambulatory Visit (HOSPITAL_COMMUNITY): Payer: Self-pay | Admitting: Internal Medicine

## 2012-05-08 DIAGNOSIS — Z0000 Encounter for general adult medical examination without abnormal findings: Secondary | ICD-10-CM

## 2012-05-13 ENCOUNTER — Ambulatory Visit (HOSPITAL_COMMUNITY)
Admission: RE | Admit: 2012-05-13 | Discharge: 2012-05-13 | Disposition: A | Discharge status: Home / Self Care | Payer: Medicaid Other | Source: Ambulatory Visit | Attending: Internal Medicine | Admitting: Internal Medicine

## 2012-05-13 DIAGNOSIS — Z0000 Encounter for general adult medical examination without abnormal findings: Secondary | ICD-10-CM

## 2012-05-13 DIAGNOSIS — Z1231 Encounter for screening mammogram for malignant neoplasm of breast: Secondary | ICD-10-CM | POA: Insufficient documentation

## 2012-05-27 ENCOUNTER — Ambulatory Visit: Payer: Medicaid Other | Admitting: Urgent Care

## 2012-05-27 ENCOUNTER — Telehealth: Payer: Self-pay | Admitting: Urgent Care

## 2012-05-27 NOTE — Telephone Encounter (Signed)
Please attempt reschedule

## 2012-05-27 NOTE — Telephone Encounter (Signed)
Pt was a no show

## 2012-05-27 NOTE — Telephone Encounter (Signed)
Pt has been RSC

## 2012-06-17 ENCOUNTER — Ambulatory Visit (INDEPENDENT_AMBULATORY_CARE_PROVIDER_SITE_OTHER): Payer: Medicaid Other | Admitting: Gastroenterology

## 2012-06-17 ENCOUNTER — Encounter: Payer: Self-pay | Admitting: Gastroenterology

## 2012-06-17 VITALS — BP 145/80 | HR 102 | Temp 97.2°F | Ht 66.0 in | Wt 146.4 lb

## 2012-06-17 DIAGNOSIS — B1920 Unspecified viral hepatitis C without hepatic coma: Secondary | ICD-10-CM

## 2012-06-17 DIAGNOSIS — B182 Chronic viral hepatitis C: Secondary | ICD-10-CM

## 2012-06-17 DIAGNOSIS — K7460 Unspecified cirrhosis of liver: Secondary | ICD-10-CM

## 2012-06-17 MED ORDER — NADOLOL 20 MG PO TABS: 20.0000 mg | ORAL_TABLET | Freq: Every day | ORAL | Status: DC

## 2012-06-17 NOTE — Progress Notes (Signed)
Referring Aundra Pung: Tiffany Hampshire, MD Primary Care Physician:  Kirk Ruths, MD Primary GI: Dr. Darrick Penna   Chief Complaint  Patient presents with  . Follow-up    HPI:   Tiffany Hale is a 57 year old female with a history of HCV cirrhosis. MRI Jan 2014 noted moderate to severe cirrhosis, no evidence of HCC. Splenomegaly noted. States she didn't want to pursue treatment because she was told 3/10 chance of treatment success.  No abdominal pain, change in bowel habits. No blood in stool. Occasional itching, states skin is broke out. No mental status changes, confusion. No reflux, heartburn. No ETOH for 10 years. No longer taking Nadolol, states she is not sure if she ever took it. Last screening for esophageal varices was in March 2011.   Past Medical History  Diagnosis Date  . Depression     problems concentrating, MHS ([facility])  . Asthma   . Hepatitis     C: Hepatitis B Sab POS June 2009  . HCV (hepatitis C virus) 2009  . HBV (hepatitis B virus) infection 2009: HBsAb POS  . Hepatitis A 2009 Ab POS  . Tobacco abuse   . Cirrhosis NOV 2011    MRI LIVER-5 SML DYSPLASTIC NODULES; 2009: TBILI 0.7 ALK PHOS 128 AST 56 ALT 53 ALB 3.8 INR 1.3  . BMI (body mass index) 20.0-29.9 2010 155 LBS    Past Surgical History  Procedure Laterality Date  . Vesicovaginal fistula closure w/ tah  1995    DUB  . Extremity surgery      and Pelvic, after fall  . Esophagogastroduodenoscopy  MAR 2011    Grade 1 varices, GASTRITIS  . Total abdominal hysterectomy  1995 DUB  . Colonoscopy  OCT 2011     6 MM SIMPLE ADENOMA, BENIGN COLONIC MUCOSA    Current Outpatient Prescriptions  Medication Sig Dispense Refill  . ARIPiprazole (ABILIFY) 15 MG tablet Take 15 mg by mouth daily.        Marland Kitchen escitalopram (LEXAPRO) 20 MG tablet Take 20 mg by mouth daily.        . methylphenidate (CONCERTA) 36 MG CR tablet Take 36 mg by mouth every morning.        . traZODone (DESYREL) 150 MG tablet Take 150 mg by mouth at  bedtime.        . nadolol (CORGARD) 20 MG tablet Take 1 tablet (20 mg total) by mouth daily.  31 tablet  3   No current facility-administered medications for this visit.    Allergies as of 06/17/2012  . (No Known Allergies)    Family History  Problem Relation Age of Onset  . Colon cancer Neg Hx   . Colon polyps Neg Hx     History   Social History  . Marital Status: Divorced    Spouse Name: N/A    Number of Children: N/A  . Years of Education: N/A   Social History Main Topics  . Smoking status: Current Every Day Smoker  . Smokeless tobacco: None     Comment: 3 days to go through a pack   . Alcohol Use: No     Comment: history of ETOH abuse, no alcohol X 10 years   . Drug Use: No  . Sexually Active: None   Other Topics Concern  . None   Social History Narrative  . None    Review of Systems: Negative unless mentioned in HPI  Physical Exam: BP 145/80  Pulse 102  Temp(Src) 97.2 F (36.2 C) (  Oral)  Ht 5\' 6"  (1.676 m)  Wt 146 lb 6.4 oz (66.407 kg)  BMI 23.64 kg/m2 General:   Alert and oriented. No distress noted. Pleasant and cooperative.  Head:  Normocephalic and atraumatic. Eyes:  Conjuctiva clear without scleral icterus. Mouth:  Oral mucosa pink and moist. Good dentition. No lesions. Heart:  S1, S2 present without murmurs, rubs, or gallops. Regular rate and rhythm. Abdomen:  +BS, soft, non-tender and non-distended. No rebound or guarding. No HSM or masses noted. Msk:  Symmetrical without gross deformities. Normal posture. Extremities:  Without edema. Neurologic:  Alert and  oriented x4;  grossly normal neurologically. Skin:  Intact without significant lesions or rashes. Psych:  Alert and cooperative. Normal mood and affect.

## 2012-06-17 NOTE — Patient Instructions (Addendum)
Please have blood work done. We will call you with the results.  Start taking Nadolol 20 mg daily. I have sent this into your pharmacy. I will let you know about an updated endoscopy after talking with Dr. Darrick Penna.  We will see you back in 6 months.

## 2012-06-18 DIAGNOSIS — K7469 Other cirrhosis of liver: Secondary | ICD-10-CM | POA: Insufficient documentation

## 2012-06-18 NOTE — Assessment & Plan Note (Addendum)
57 year old female with history of HCV cirrhosis; she is declining treatment as she understood her chance of success were 3 out of 10. I do not have notes currently to confirm this. She was seen by Dr. Jacqualine Mau in the past. MRI is up to date and negative for [facility] Last EGD for variceal surveillance was in March 2011, Grade 1 varices noted. She denies taking Nadolol, although this was prescribed for primary prophylaxis. I anticipate need for surveillance EGD in near future with Dr. Darrick Penna to reassess varices. I have sent an updated prescription for Nadolol to her pharmacy. Will discuss with Dr. Darrick Penna updated EGD. Patient aware this may be the next step. The risks and benefits were discussed in detail, and she stated understanding.   Obtain CBC, PT/INR, BMP MRI in 1 year Follow-up 6 mos    Addendum: Per Dr. Darrick Penna, proceed with EGD surveillance due to varices. Propofol due to polypharmacy.

## 2012-06-18 NOTE — Progress Notes (Signed)
Faxed to PCP

## 2012-06-24 ENCOUNTER — Telehealth: Payer: Self-pay | Admitting: Gastroenterology

## 2012-06-24 ENCOUNTER — Other Ambulatory Visit: Payer: Self-pay | Admitting: Gastroenterology

## 2012-06-24 NOTE — Telephone Encounter (Signed)
Patient is scheduled w/SLF on April 1st in the [facility] I have mailed her the instructions and she is aware

## 2012-06-24 NOTE — Telephone Encounter (Signed)
I saw patient recently in clinic. Please let her know I did discuss with Dr. Darrick Penna, and we need to proceed with an upper endoscopy for reassessment of varices.    Please schedule EGD with SLF with Propofol due to polypharmacy.

## 2012-06-28 ENCOUNTER — Encounter (HOSPITAL_COMMUNITY): Payer: Self-pay | Admitting: Pharmacy Technician

## 2012-07-03 ENCOUNTER — Encounter (HOSPITAL_COMMUNITY): Payer: Self-pay

## 2012-07-03 ENCOUNTER — Encounter (HOSPITAL_COMMUNITY)
Admission: RE | Admit: 2012-07-03 | Discharge: 2012-07-03 | Disposition: A | Discharge status: Home / Self Care | Payer: Medicaid Other | Source: Ambulatory Visit | Attending: Gastroenterology | Admitting: Gastroenterology

## 2012-07-03 HISTORY — DX: Dorsalgia, unspecified: M54.9

## 2012-07-03 HISTORY — DX: Unspecified osteoarthritis, unspecified site: M19.90

## 2012-07-03 HISTORY — DX: Other chronic pain: G89.29

## 2012-07-03 LAB — BASIC METABOLIC PANEL
CO2: 25 mEq/L (ref 19–32)
Calcium: 9 mg/dL (ref 8.4–10.5)
Chloride: 99 mEq/L (ref 96–112)
Glucose, Bld: 104 mg/dL — ABNORMAL HIGH (ref 70–99)
Sodium: 131 mEq/L — ABNORMAL LOW (ref 135–145)

## 2012-07-03 LAB — HEMOGLOBIN AND HEMATOCRIT, BLOOD: HCT: 37.9 % (ref 36.0–46.0)

## 2012-07-03 LAB — SURGICAL PCR SCREEN: MRSA, PCR: NEGATIVE

## 2012-07-03 NOTE — Patient Instructions (Addendum)
Tiffany Hale  07/03/2012   Your procedure is scheduled on:  07/09/12  Report to [facility] at 06:15 AM.  Call this number if you have problems the morning of surgery: [phone]   Remember:   Do not eat food or drink liquids after midnight.   Take these medicines the morning of surgery with A SIP OF WATER: Nadolol Abilify, Lexapro and Concerta.   Do not wear jewelry, make-up or nail polish.  Do not wear lotions, powders, or perfumes. You may wear deodorant.  Do not shave 48 hours prior to surgery. Men may shave face and neck.  Do not bring valuables to the hospital.  Contacts, dentures or bridgework may not be worn into surgery.  Leave suitcase in the car. After surgery it may be brought to your room.    Patients discharged the day of surgery will not be allowed to drive home.   Special Instructions: N/A   Please read over the following fact sheets that you were given: Pain Booklet, Anesthesia Post-op Instructions and Care and Recovery After Surgery    Esophagogastroduodenoscopy This is an endoscopic procedure (a procedure that uses a device like a flexible telescope) that allows your caregiver to view the upper stomach and small bowel. This test allows your caregiver to look at the esophagus. The esophagus carries food from your mouth to your stomach. They can also look at your duodenum. This is the first part of the small intestine that attaches to the stomach. This test is used to detect problems in the bowel such as ulcers and inflammation. PREPARATION FOR TEST Nothing to eat after midnight the day before the test. NORMAL FINDINGS Normal esophagus, stomach, and duodenum. Ranges for normal findings may vary among different laboratories and hospitals. You should always check with your doctor after having lab work or other tests done to discuss the meaning of your test results and whether your values are considered within normal limits. MEANING OF TEST  Your caregiver will go  over the test results with you and discuss the importance and meaning of your results, as well as treatment options and the need for additional tests if necessary. OBTAINING THE TEST RESULTS It is your responsibility to obtain your test results. Ask the lab or department performing the test when and how you will get your results. Document Released: 07/28/2004 Document Revised: 06/19/2011 Document Reviewed: 03/06/2008 [facility] Patient Information 2013 [place], [place].    PATIENT INSTRUCTIONS POST-ANESTHESIA  IMMEDIATELY FOLLOWING SURGERY:  Do not drive or operate machinery for the first twenty four hours after surgery.  Do not make any important decisions for twenty four hours after surgery or while taking narcotic pain medications or sedatives.  If you develop intractable nausea and vomiting or a severe headache please notify your doctor immediately.  FOLLOW-UP:  Please make an appointment with your surgeon as instructed. You do not need to follow up with anesthesia unless specifically instructed to do so.  WOUND CARE INSTRUCTIONS (if applicable):  Keep a dry clean dressing on the anesthesia/puncture wound site if there is drainage.  Once the wound has quit draining you may leave it open to air.  Generally you should leave the bandage intact for twenty four hours unless there is drainage.  If the epidural site drains for more than 36-48 hours please call the anesthesia department.  QUESTIONS?:  Please feel free to call your physician or the hospital operator if you have any questions, and they will be happy to assist you.

## 2012-07-04 ENCOUNTER — Other Ambulatory Visit (HOSPITAL_COMMUNITY): Payer: Self-pay | Admitting: Internal Medicine

## 2012-07-04 DIAGNOSIS — M5430 Sciatica, unspecified side: Secondary | ICD-10-CM

## 2012-07-04 DIAGNOSIS — M545 Low back pain: Secondary | ICD-10-CM

## 2012-07-08 ENCOUNTER — Ambulatory Visit (HOSPITAL_COMMUNITY)
Admission: RE | Admit: 2012-07-08 | Discharge: 2012-07-08 | Disposition: A | Discharge status: Home / Self Care | Payer: Medicaid Other | Source: Ambulatory Visit | Attending: Internal Medicine | Admitting: Internal Medicine

## 2012-07-08 DIAGNOSIS — M545 Low back pain, unspecified: Secondary | ICD-10-CM | POA: Insufficient documentation

## 2012-07-08 DIAGNOSIS — M5137 Other intervertebral disc degeneration, lumbosacral region: Secondary | ICD-10-CM | POA: Insufficient documentation

## 2012-07-08 DIAGNOSIS — M47817 Spondylosis without myelopathy or radiculopathy, lumbosacral region: Secondary | ICD-10-CM | POA: Insufficient documentation

## 2012-07-08 DIAGNOSIS — M51379 Other intervertebral disc degeneration, lumbosacral region without mention of lumbar back pain or lower extremity pain: Secondary | ICD-10-CM | POA: Insufficient documentation

## 2012-07-08 DIAGNOSIS — M5430 Sciatica, unspecified side: Secondary | ICD-10-CM

## 2012-07-09 ENCOUNTER — Encounter (HOSPITAL_COMMUNITY)
Admission: RE | Disposition: A | Discharge status: Home / Self Care | Payer: Self-pay | Source: Ambulatory Visit | Attending: Gastroenterology

## 2012-07-09 ENCOUNTER — Ambulatory Visit (HOSPITAL_COMMUNITY): Payer: Medicaid Other | Admitting: Anesthesiology

## 2012-07-09 ENCOUNTER — Encounter (HOSPITAL_COMMUNITY): Payer: Self-pay | Admitting: Anesthesiology

## 2012-07-09 ENCOUNTER — Encounter (HOSPITAL_COMMUNITY): Payer: Self-pay | Admitting: *Deleted

## 2012-07-09 ENCOUNTER — Ambulatory Visit (HOSPITAL_COMMUNITY)
Admission: RE | Admit: 2012-07-09 | Discharge: 2012-07-09 | Disposition: A | Discharge status: Home / Self Care | Payer: Medicaid Other | Source: Ambulatory Visit | Attending: Gastroenterology | Admitting: Gastroenterology

## 2012-07-09 DIAGNOSIS — K7460 Unspecified cirrhosis of liver: Secondary | ICD-10-CM | POA: Insufficient documentation

## 2012-07-09 DIAGNOSIS — K449 Diaphragmatic hernia without obstruction or gangrene: Secondary | ICD-10-CM

## 2012-07-09 DIAGNOSIS — K319 Disease of stomach and duodenum, unspecified: Secondary | ICD-10-CM | POA: Insufficient documentation

## 2012-07-09 DIAGNOSIS — K2971 Gastritis, unspecified, with bleeding: Secondary | ICD-10-CM

## 2012-07-09 DIAGNOSIS — Z01812 Encounter for preprocedural laboratory examination: Secondary | ICD-10-CM | POA: Insufficient documentation

## 2012-07-09 DIAGNOSIS — K2991 Gastroduodenitis, unspecified, with bleeding: Secondary | ICD-10-CM

## 2012-07-09 DIAGNOSIS — K2960 Other gastritis without bleeding: Secondary | ICD-10-CM | POA: Insufficient documentation

## 2012-07-09 HISTORY — PX: ESOPHAGOGASTRODUODENOSCOPY (EGD) WITH PROPOFOL: SHX5813

## 2012-07-09 LAB — PROTIME-INR
INR: 1.21 (ref 0.00–1.49)
Prothrombin Time: 15.1 seconds (ref 11.6–15.2)

## 2012-07-09 LAB — HEPATIC FUNCTION PANEL
AST: 33 U/L (ref 0–37)
Albumin: 3.2 g/dL — ABNORMAL LOW (ref 3.5–5.2)
Total Bilirubin: 0.3 mg/dL (ref 0.3–1.2)
Total Protein: 7.7 g/dL (ref 6.0–8.3)

## 2012-07-09 SURGERY — ESOPHAGOGASTRODUODENOSCOPY (EGD) WITH PROPOFOL
Anesthesia: Monitor Anesthesia Care

## 2012-07-09 MED ORDER — PROPOFOL INFUSION 10 MG/ML OPTIME
INTRAVENOUS | Status: DC | PRN
  Administered 2012-07-09: 45 ug/kg/min via INTRAVENOUS

## 2012-07-09 MED ORDER — ONDANSETRON HCL 4 MG/2ML IJ SOLN
INTRAMUSCULAR | Status: AC
  Filled 2012-07-09: qty 2

## 2012-07-09 MED ORDER — PROPOFOL 10 MG/ML IV EMUL
INTRAVENOUS | Status: AC
  Filled 2012-07-09: qty 20

## 2012-07-09 MED ORDER — LIDOCAINE HCL 1 % IJ SOLN
INTRAMUSCULAR | Status: DC | PRN
  Administered 2012-07-09: 50 mg via INTRADERMAL

## 2012-07-09 MED ORDER — LACTATED RINGERS IV SOLN
INTRAVENOUS | Status: DC
  Administered 2012-07-09: 1000 mL via INTRAVENOUS

## 2012-07-09 MED ORDER — MIDAZOLAM HCL 2 MG/2ML IJ SOLN
INTRAMUSCULAR | Status: AC
  Filled 2012-07-09: qty 2

## 2012-07-09 MED ORDER — GLYCOPYRROLATE 0.2 MG/ML IJ SOLN
INTRAMUSCULAR | Status: AC
  Filled 2012-07-09: qty 1

## 2012-07-09 MED ORDER — FENTANYL CITRATE 0.05 MG/ML IJ SOLN: 25.0000 ug | INTRAMUSCULAR | Status: DC | PRN

## 2012-07-09 MED ORDER — BUTAMBEN-TETRACAINE-BENZOCAINE 2-2-14 % EX AERO
1.0000 | INHALATION_SPRAY | Freq: Once | CUTANEOUS | Status: AC
  Administered 2012-07-09: 1 via TOPICAL
  Filled 2012-07-09: qty 56

## 2012-07-09 MED ORDER — ONDANSETRON HCL 4 MG/2ML IJ SOLN: 4.0000 mg | Freq: Once | INTRAMUSCULAR | Status: DC | PRN

## 2012-07-09 MED ORDER — MIDAZOLAM HCL 2 MG/2ML IJ SOLN
1.0000 mg | INTRAMUSCULAR | Status: DC | PRN
  Administered 2012-07-09 (×2): 2 mg via INTRAVENOUS

## 2012-07-09 MED ORDER — STERILE WATER FOR IRRIGATION IR SOLN
Status: DC | PRN
  Administered 2012-07-09: 08:00:00

## 2012-07-09 MED ORDER — GLYCOPYRROLATE 0.2 MG/ML IJ SOLN
0.2000 mg | Freq: Once | INTRAMUSCULAR | Status: AC
  Administered 2012-07-09: 0.2 mg via INTRAVENOUS

## 2012-07-09 MED ORDER — ONDANSETRON HCL 4 MG/2ML IJ SOLN
4.0000 mg | Freq: Once | INTRAMUSCULAR | Status: AC
  Administered 2012-07-09: 4 mg via INTRAVENOUS

## 2012-07-09 MED ORDER — LIDOCAINE HCL (PF) 1 % IJ SOLN
INTRAMUSCULAR | Status: AC
  Filled 2012-07-09: qty 5

## 2012-07-09 MED ORDER — FENTANYL CITRATE 0.05 MG/ML IJ SOLN
INTRAMUSCULAR | Status: AC
  Filled 2012-07-09: qty 2

## 2012-07-09 MED ORDER — FENTANYL CITRATE 0.05 MG/ML IJ SOLN
25.0000 ug | INTRAMUSCULAR | Status: DC | PRN
  Administered 2012-07-09: 25 ug via INTRAVENOUS

## 2012-07-09 SURGICAL SUPPLY — 16 items
BLOCK BITE 60FR ADLT L/F BLUE (MISCELLANEOUS) ×2 IMPLANT
ELECT REM PT RETURN 9FT ADLT (ELECTROSURGICAL)
ELECTRODE REM PT RTRN 9FT ADLT (ELECTROSURGICAL) IMPLANT
FLOOR PAD 36X40 (MISCELLANEOUS)
FORCEPS BIOP RAD 4 LRG CAP 4 (CUTTING FORCEPS) ×2 IMPLANT
MANIFOLD NEPTUNE II (INSTRUMENTS) ×4 IMPLANT
NEEDLE SCLEROTHERAPY 25GX240 (NEEDLE) IMPLANT
PAD FLOOR 36X40 (MISCELLANEOUS) IMPLANT
PROBE APC STR FIRE (PROBE) IMPLANT
PROBE INJECTION GOLD (MISCELLANEOUS)
PROBE INJECTION GOLD 7FR (MISCELLANEOUS) IMPLANT
SNARE SHORT THROW 13M SML OVAL (MISCELLANEOUS) IMPLANT
SYR 50ML LL SCALE MARK (SYRINGE) ×2 IMPLANT
TUBING ENDO SMARTCAP PENTAX (MISCELLANEOUS) ×2 IMPLANT
TUBING IRRIGATION ENDOGATOR (MISCELLANEOUS) ×2 IMPLANT
WATER STERILE IRR 1000ML POUR (IV SOLUTION) ×2 IMPLANT

## 2012-07-09 NOTE — Progress Notes (Signed)
Awake. Swallowing without difficulty. Voices no c/o at this time.

## 2012-07-09 NOTE — H&P (Signed)
Primary Care Physician:  Kirk Ruths, MD Primary Gastroenterologist:  Dr. Darrick Penna  Pre-Procedure History & Physical: HPI:  Tiffany Hale is a 57 y.o. female here for SCREENING FOR VARICES.  Past Medical History  Diagnosis Date  . Depression     problems concentrating, [facility]HS ([facility])  . Asthma   . Hepatitis     C: Hepatitis B Sab POS June 2009  . HCV (hepatitis C virus) 2009  . HBV (hepatitis B virus) infection 2009: HBsAb POS  . Hepatitis A 2009 Ab POS  . Tobacco abuse   . Cirrhosis NOV 2011    MRI LIVER-5 SML DYSPLASTIC NODULES; 2009: TBILI 0.7 ALK PHOS 128 AST 56 ALT 53 ALB 3.8 INR 1.3  . BMI (body mass index) 20.0-29.9 2010 155 LBS  . Chronic back pain   . Arthritis     Past Surgical History  Procedure Laterality Date  . Vesicovaginal fistula closure w/ tah  1995    DUB  . Extremity surgery      and Pelvic, after fall  . Esophagogastroduodenoscopy  MAR 2011    Grade 1 varices, GASTRITIS  . Total abdominal hysterectomy  1995 DUB  . Colonoscopy  OCT 2011     6 MM SIMPLE ADENOMA, BENIGN COLONIC MUCOSA  . Foot surgery Right 2009    [facility]-crushed foot with fall and it was repaired    Prior to Admission medications   Medication Sig Start Date End Date Taking? Authorizing Provider  ARIPiprazole (ABILIFY) 15 MG tablet Take 15 mg by mouth daily.     Yes Historical Provider, MD  escitalopram (LEXAPRO) 20 MG tablet Take 30 mg by mouth daily.    Yes Historical Provider, MD  methylphenidate (CONCERTA) 36 MG CR tablet Take 36 mg by mouth every morning.     Yes Historical Provider, MD  nadolol (CORGARD) 20 MG tablet Take 1 tablet (20 mg total) by mouth daily. 06/17/12  Yes Nira Retort, NP  traZODone (DESYREL) 150 MG tablet Take 150 mg by mouth at bedtime.    Yes Historical Provider, MD  zolpidem (AMBIEN) 10 MG tablet Take 10 mg by mouth at bedtime.   Yes Historical Provider, MD    Allergies as of 06/24/2012  . (No Known Allergies)    Family History  Problem Relation  Age of Onset  . Colon cancer Neg Hx   . Colon polyps Neg Hx     History   Social History  . Marital Status: Divorced    Spouse Name: N/A    Number of Children: N/A  . Years of Education: N/A   Occupational History  . Not on file.   Social History Main Topics  . Smoking status: Current Every Day Smoker -- 40 years    Types: Cigarettes  . Smokeless tobacco: Not on file     Comment: 3 days to go through a pack   . Alcohol Use: No     Comment: history of ETOH abuse, no alcohol X 10 years   . Drug Use: No  . Sexually Active: Not on file   Other Topics Concern  . Not on file   Social History Narrative  . No narrative on file    Review of Systems: See HPI, otherwise negative ROS   Physical Exam: BP 135/70  Pulse 52  Temp(Src) 98.3 F (36.8 C) (Oral)  Resp 14  Wt 150 lb (68.04 kg)  BMI 24.22 kg/m2  SpO2 95% General:   Alert,  pleasant and cooperative  in NAD Head:  Normocephalic and atraumatic. Neck:  Supple; Lungs:  Clear throughout to auscultation.    Heart:  Regular rate and rhythm. Abdomen:  Soft, nontender and nondistended. Normal bowel sounds, without guarding, and without rebound.   Neurologic:  Alert and  oriented x4;  grossly normal neurologically.  Impression/Plan:     SCREENING VARICES  PLAN:  1.EGD TODAY

## 2012-07-09 NOTE — Anesthesia Preprocedure Evaluation (Addendum)
Anesthesia Evaluation  Patient identified by MRN, date of birth, ID band Patient awake    Reviewed: Allergy & Precautions, H&P , NPO status , Patient's Chart, lab work & pertinent test results  Airway Mallampati: II TM Distance: >3 FB     Dental  (+) Edentulous Upper and Edentulous Lower   Pulmonary  breath sounds clear to auscultation        Cardiovascular negative cardio ROS  Rhythm:Regular Rate:Normal     Neuro/Psych    GI/Hepatic (+) Cirrhosis -  Esophageal Varices     , Hepatitis -, A and C  Endo/Other    Renal/GU      Musculoskeletal   Abdominal   Peds  Hematology   Anesthesia Other Findings   Reproductive/Obstetrics                          Anesthesia Physical Anesthesia Plan  ASA: III  Anesthesia Plan: MAC   Post-op Pain Management:    Induction: Intravenous  Airway Management Planned: Simple Face Mask  Additional Equipment:   Intra-op Plan:   Post-operative Plan:   Informed Consent: I have reviewed the patients History and Physical, chart, labs and discussed the procedure including the risks, benefits and alternatives for the proposed anesthesia with the patient or authorized representative who has indicated his/her understanding and acceptance.     Plan Discussed with:   Anesthesia Plan Comments:         Anesthesia Quick Evaluation

## 2012-07-09 NOTE — Anesthesia Postprocedure Evaluation (Signed)
  Anesthesia Post-op Note  Patient: Tiffany Hale  Procedure(s) Performed: Procedure(s): ESOPHAGOGASTRODUODENOSCOPY (EGD) WITH PROPOFOL (N/A)  Patient Location: PACU  Anesthesia Type:MAC  Level of Consciousness: awake, alert , oriented and patient cooperative  Airway and Oxygen Therapy: Patient Spontanous Breathing  Post-op Pain: none  Post-op Assessment: Post-op Vital signs reviewed, Patient's Cardiovascular Status Stable, Respiratory Function Stable, Patent Airway, No signs of Nausea or vomiting and Pain level controlled  Post-op Vital Signs: Reviewed and stable  Complications: No apparent anesthesia complications

## 2012-07-09 NOTE — Transfer of Care (Signed)
Immediate Anesthesia Transfer of Care Note  Patient: Tiffany Hale  Procedure(s) Performed: Procedure(s): ESOPHAGOGASTRODUODENOSCOPY (EGD) WITH PROPOFOL (N/A)  Patient Location: PACU  Anesthesia Type:MAC  Level of Consciousness: patient cooperative  Airway & Oxygen Therapy: Patient Spontanous Breathing and Patient connected to face mask oxygen  Post-op Assessment: Report given to PACU RN, Post -op Vital signs reviewed and stable and Patient moving all extremities  Post vital signs: Reviewed and stable  Complications: No apparent anesthesia complications

## 2012-07-09 NOTE — Op Note (Signed)
[facility] [address] [place], [address]   ENDOSCOPY PROCEDURE REPORT  PATIENT: Tiffany Hale, Tiffany Hale  MR#: [id number] BIRTHDATE: April 11, 1955 , 57  yrs. old GENDER: Female  ENDOSCOPIST: Jonette Eva, MD REFERRED [id number] Regino Schultze, M.D.  PROCEDURE DATE: 07/09/2012 PROCEDURE:   EGD w/ biopsy  INDICATIONS:Screening for varices. MAR 2014: CHILD PUGH A, MELD SCORE 9 MEDICATIONS: MAC sedation, administered by CRNA TOPICAL ANESTHETIC:   Cetacaine Spray  DESCRIPTION OF PROCEDURE:     Physical exam was performed.  Informed consent was obtained from the patient after explaining the benefits, risks, and alternatives to the procedure.  The patient was connected to the monitor and placed in the left lateral position.  Continuous oxygen was provided by nasal cannula and IV medicine administered through an indwelling cannula.  After administration of sedation, the patients esophagus was intubated and the     endoscope was advanced under direct visualization to the second portion of the duodenum.  The scope was removed slowly by carefully examining the color, texture, anatomy, and integrity of the mucosa on the way out.  The patient was recovered in endoscopy and discharged home in satisfactory condition.   ESOPHAGUS: The mucosa of the esophagus appeared normal.   STOMACH: A small hiatal hernia was noted.   Moderate non-erosive gastritis (inflammation) with hemorrhage was found in the gastric antrum. Multiple biopsies were performed.   DUODENUM: The duodenal mucosa showed no abnormalities in the bulb and second portion of the duodenum.   NO VARICES APPRECIATED IN THE ESOPAHGUS, CARDIA. FUNDUS, OR DUODENAL BULB.  COMPLICATIONS:   None  ENDOSCOPIC IMPRESSION: 1.   The mucosa of the esophagus appeared normal 2.   Small hiatal hernia 3.   MODERATE Non-erosive gastritis (inflammation) with hemorrhage was found in the gastric antrum; multiple biopsies 4.   NO VARICES  APPRECIATED IN THE ESOPHAGUS, CARDIA.  FUNDUS, OR DUODENAL BULB.  RECOMMENDATIONS: AWAIT BIOPSY. WILL NEED PPI BID FOR 3 MOS STOP NADOLOL-PT CHILD PUGH A AND NO DEFINITE BENEFIT FOR USING BB FOR PRIMARY ESO VARICEAL PROPHYLAXIS REPEAT EGD IN 2 YEARS   REPEAT EXAM:   _______________________________ Rosalie DoctorJonette Eva, MD 07/09/2012 1:27 PM       PATIENT NAME:  Tiffany Hale MR#: [id number]

## 2012-07-11 ENCOUNTER — Encounter (HOSPITAL_COMMUNITY): Payer: Self-pay | Admitting: Gastroenterology

## 2012-07-18 ENCOUNTER — Ambulatory Visit (INDEPENDENT_AMBULATORY_CARE_PROVIDER_SITE_OTHER): Payer: Medicaid Other | Admitting: Obstetrics & Gynecology

## 2012-07-18 ENCOUNTER — Encounter: Payer: Self-pay | Admitting: Obstetrics & Gynecology

## 2012-07-18 VITALS — BP 130/80 | Ht 63.0 in | Wt 143.0 lb

## 2012-07-18 DIAGNOSIS — Z1212 Encounter for screening for malignant neoplasm of rectum: Secondary | ICD-10-CM

## 2012-07-18 DIAGNOSIS — N952 Postmenopausal atrophic vaginitis: Secondary | ICD-10-CM | POA: Insufficient documentation

## 2012-07-18 DIAGNOSIS — IMO0002 Reserved for concepts with insufficient information to code with codable children: Secondary | ICD-10-CM | POA: Insufficient documentation

## 2012-07-18 MED ORDER — ESTRADIOL 0.1 MG/GM VA CREA: 1.5200 g | TOPICAL_CREAM | Freq: Every day | VAGINAL | Status: DC

## 2012-07-18 NOTE — Progress Notes (Signed)
Patient ID: Tiffany Hale, female   DOB: 11/15/1955, 57 y.o.   MRN: [id number] Patient is not engage in intercourse for several years.  She has never benign postmenopausal estrogen placement therapy.  Her ovaries were salvaged at the time of her hysterectomy in 1995.  The pain is with insertion and thereafter.    On examination Vulva  normal postmenopausal appearance Vagina  atrophic vaginitis with loss of vaginal folds no lesions or masses mild anterior prolapse grade 1 no posterior prolapse vaginal apex well supported Bimanual  no midline or adnexal masses palpable Rectal  no masses palpable good sphincter tying Heme Hemoccult is negative for blood  Impression Postmenopausal atrophic vaginitis Insertional dyspareunia  Plan Begin estrogen therapy vaginally 2 g every other night and our reevaluate the patient in 3 months She understands this will take some time to improve the help of the vagina and may take quite a few number of months to alleviate her dyspareunia  EURE,LUTHER H 07/18/2012 11:02 AM

## 2012-07-18 NOTE — Patient Instructions (Addendum)
Atrophic Vaginitis Atrophic vaginitis is a problem of low levels of estrogen in women. This problem can happen at any age. It is most common in women who have gone through menopause ("the change").  HOW WILL I KNOW IF I HAVE THIS PROBLEM? You may have:  Trouble with peeing (urinating), such as:  Going to the bathroom often.  A hard time holding your pee until you reach a bathroom.  Leaking pee.  Having pain when you pee.  Itching or a burning feeling.  Vaginal bleeding and spotting.  Pain during sex.  Dryness of the vagina.  A yellow, bad-smelling fluid (discharge) coming from the vagina. HOW WILL MY DOCTOR CHECK FOR THIS PROBLEM?  During your exam, your doctor will likely find the problem.  If there is a vaginal fluid, it may be checked for infection. HOW WILL THIS PROBLEM BE TREATED? Keep the vulvar skin as clean as possible. Moisturizers and lubricants can help with some of the symptoms. Estrogen replacement can help. There are 2 ways to take estrogen:  Systemic estrogen gets estrogen to your whole body. It takes many weeks or months before the symptoms get better.  You take an estrogen pill.  You use a skin patch. This is a patch that you put on your skin.  If you still have your uterus, your doctor may ask you to take a hormone. Talk to your doctor about the right medicine for you.  Estrogen cream.  This puts estrogen only at the part of your body where you apply it. The cream is put into the vagina or put on the vulvar skin. For some women, estrogen cream works faster than pills or the patch. CAN ALL WOMEN WITH THIS PROBLEM USE ESTROGEN? No. Women with certain types of cancer, liver problems, or problems with blood clots should not take estrogen. Your doctor can help you decide the best treatment for your symptoms. Document Released: 09/13/2007 Document Revised: 06/19/2011 Document Reviewed: 09/13/2007 [employer] Patient Information 2013 [employer].  

## 2012-09-28 NOTE — Progress Notes (Signed)
REVIEWED.  APR 2014 EGD-NO VARICES BID FOR 3 MOS  STOP NADOLOL-PT CHILD PUGH A AND NO DEFINITE BENEFIT FOR USING BB  FOR PRIMARY ESO VARICEAL PROPHYLAXIS  REPEAT EGD 2016   OPV SEP 2014 W/ SLF E15 CIRRHOSIS

## 2012-09-30 NOTE — Progress Notes (Signed)
Reminders in epic °

## 2012-10-17 ENCOUNTER — Ambulatory Visit: Payer: Medicaid Other | Admitting: Obstetrics & Gynecology

## 2012-11-07 ENCOUNTER — Other Ambulatory Visit (HOSPITAL_COMMUNITY): Payer: Self-pay | Admitting: Physician Assistant

## 2012-11-07 DIAGNOSIS — Z0000 Encounter for general adult medical examination without abnormal findings: Secondary | ICD-10-CM

## 2012-11-12 ENCOUNTER — Ambulatory Visit (HOSPITAL_COMMUNITY)
Admission: RE | Admit: 2012-11-12 | Discharge: 2012-11-12 | Disposition: A | Discharge status: Home / Self Care | Payer: Medicaid Other | Source: Ambulatory Visit | Attending: Physician Assistant | Admitting: Physician Assistant

## 2012-11-12 DIAGNOSIS — Z87891 Personal history of nicotine dependence: Secondary | ICD-10-CM | POA: Insufficient documentation

## 2012-11-12 DIAGNOSIS — Z90711 Acquired absence of uterus with remaining cervical stump: Secondary | ICD-10-CM | POA: Insufficient documentation

## 2012-11-12 DIAGNOSIS — Z780 Asymptomatic menopausal state: Secondary | ICD-10-CM | POA: Insufficient documentation

## 2012-11-12 DIAGNOSIS — M899 Disorder of bone, unspecified: Secondary | ICD-10-CM | POA: Insufficient documentation

## 2012-11-12 DIAGNOSIS — Z0000 Encounter for general adult medical examination without abnormal findings: Secondary | ICD-10-CM

## 2012-11-18 ENCOUNTER — Encounter: Payer: Self-pay | Admitting: Gastroenterology

## 2012-11-28 ENCOUNTER — Ambulatory Visit (INDEPENDENT_AMBULATORY_CARE_PROVIDER_SITE_OTHER): Payer: Medicaid Other | Admitting: Obstetrics & Gynecology

## 2012-11-28 ENCOUNTER — Encounter: Payer: Self-pay | Admitting: Obstetrics & Gynecology

## 2012-11-28 VITALS — BP 140/70 | Wt 144.0 lb

## 2012-11-28 DIAGNOSIS — N952 Postmenopausal atrophic vaginitis: Secondary | ICD-10-CM

## 2012-11-28 DIAGNOSIS — IMO0002 Reserved for concepts with insufficient information to code with codable children: Secondary | ICD-10-CM

## 2012-11-28 MED ORDER — ESTRADIOL 0.1 MG/GM VA CREA: 1.5200 g | TOPICAL_CREAM | Freq: Every day | VAGINAL | Status: DC

## 2012-11-28 NOTE — Progress Notes (Signed)
Patient ID: Tiffany Hale, female   DOB: [birth year], 57 y.o.   MRN: [id number] The vagina is significantly improved in its moisture and the surface redundancy. Pt states the burning and itching is not as bad.  Continue vaginal estrace cream 2 grams every other night  Follow up for yearly in 9 months

## 2012-12-26 ENCOUNTER — Encounter: Payer: Self-pay | Admitting: Gastroenterology

## 2012-12-30 ENCOUNTER — Encounter: Payer: Self-pay | Admitting: Gastroenterology

## 2012-12-30 ENCOUNTER — Ambulatory Visit (INDEPENDENT_AMBULATORY_CARE_PROVIDER_SITE_OTHER): Payer: Medicaid Other | Admitting: Gastroenterology

## 2012-12-30 VITALS — BP 153/84 | HR 62 | Temp 98.0°F | Ht 64.0 in | Wt 144.4 lb

## 2012-12-30 DIAGNOSIS — B182 Chronic viral hepatitis C: Secondary | ICD-10-CM

## 2012-12-30 DIAGNOSIS — B1920 Unspecified viral hepatitis C without hepatic coma: Secondary | ICD-10-CM

## 2012-12-30 DIAGNOSIS — K7460 Unspecified cirrhosis of liver: Secondary | ICD-10-CM

## 2012-12-30 LAB — CBC WITH DIFFERENTIAL/PLATELET
Eosinophils Relative: 2 % (ref 0–5)
HCT: 39.2 % (ref 36.0–46.0)
Hemoglobin: 13.5 g/dL (ref 12.0–15.0)
Lymphocytes Relative: 30 % (ref 12–46)
Lymphs Abs: 1.3 10*3/uL (ref 0.7–4.0)
MCV: 89.7 fL (ref 78.0–100.0)
Monocytes Absolute: 0.3 10*3/uL (ref 0.1–1.0)
Monocytes Relative: 7 % (ref 3–12)
RBC: 4.37 MIL/uL (ref 3.87–5.11)
WBC: 4.5 10*3/uL (ref 4.0–10.5)

## 2012-12-30 NOTE — Progress Notes (Signed)
CC'd to PCP 

## 2012-12-30 NOTE — Patient Instructions (Addendum)
1. Referral to [facility] for reevaluation of chronic hepatitis C treatment. 2. Lab work is discussed. 3. Office visit with Dr. Darrick Penna in 6 months.  Referral has been faxed to the [facility]

## 2012-12-30 NOTE — Assessment & Plan Note (Signed)
Clinically doing well from standpoint of HCV cirrhosis. Interested in discussing any new treatment options with HCV clinic. I could not locate records of which genotype she is and we have had difficulty obtaining records from [facility] liver clinic she was sent at before. Recheck genotype along with other routine f/u labs. She will be due for MRI liver with EOVIST (with and without contrast) in 04/2013. OV in six months with Dr. Darrick Penna.

## 2012-12-30 NOTE — Progress Notes (Signed)
Primary Care Physician: Kirk Ruths, MD  Primary Gastroenterologist:  Jonette Eva, MD   Chief Complaint  Patient presents with  . Follow-up    HPI: Tiffany Hale is a 57 y.o. female here for followup of HCV cirrhosis. After her last office visit she had an EGD in April 2014 showed small hiatal hernia, moderate nonerosive gastritis with benign biopsies, no varices. Repeat EGD planned for 2 years.   She was actually advised to stop nadolol after her last endoscopy given no varices noted, Child Pugh A and benefit for using nonselective beta blocker for primary esophageal variceal prophylaxis not definite. She states she actually thought her PCP because of hypertension and they discussed keeping her on an for dual purpose. Complains of some intermittent itching, but not sure if this related to her skin cancers. [place] for skin cancer treatment. Goes back on November 5. Appetite good. No heartburn. BM irregular. Few BMs per week. No melena, brbpr. No excessive drowsiness. Complains of fatigue. Depression well-control. Medications are stable. No etoh use. No edema. Previously seen the hepatitis clinic, by her estimate over 5 years ago, did not want to pursue treatment at that time. Discussed new treatment options and improved success rate with the patient. She is interested in at least and a consultation.   Current Outpatient Prescriptions  Medication Sig Dispense Refill  . ARIPiprazole (ABILIFY) 15 MG tablet Take 15 mg by mouth daily.        Marland Kitchen escitalopram (LEXAPRO) 20 MG tablet Take 30 mg by mouth daily.       Marland Kitchen estradiol (ESTRACE) 0.1 MG/GM vaginal cream Place 0.25 Applicatorfuls vaginally daily. Use every other night  42.5 g  12  . methylphenidate (CONCERTA) 36 MG CR tablet Take 36 mg by mouth every morning.        . nadolol (CORGARD) 20 MG tablet Take 1 tablet (20 mg total) by mouth daily.  31 tablet  3  . traZODone (DESYREL) 150 MG tablet Take 150 mg by mouth at bedtime.        Marland Kitchen zolpidem (AMBIEN) 10 MG tablet Take 10 mg by mouth at bedtime.       No current facility-administered medications for this visit.    Allergies as of 12/30/2012  . (No Known Allergies)   Past Medical History  Diagnosis Date  . Depression     problems concentrating, MHS ([facility])  . Asthma   . Hepatitis     C: Hepatitis B Sab POS June 2009  . HCV (hepatitis C virus) 2009  . HBV (hepatitis B virus) infection 2009: HBsAb POS  . Hepatitis A 2009 Ab POS  . Tobacco abuse   . Cirrhosis NOV 2011    MRI LIVER-5 SML DYSPLASTIC NODULES; 2009: TBILI 0.7 ALK PHOS 128 AST 56 ALT 53 ALB 3.8 INR 1.3  . BMI (body mass index) 20.0-29.9 2010 155 LBS  . Chronic back pain   . Arthritis    Past Surgical History  Procedure Laterality Date  . Extremity surgery      and Pelvic, after fall  . Esophagogastroduodenoscopy  MAR 2011    Grade 1 varices, GASTRITIS  . Total abdominal hysterectomy  1995 DUB  . Colonoscopy  OCT 2011     6 MM SIMPLE ADENOMA, BENIGN COLONIC MUCOSA  . Foot surgery Right 2009    [facility]-crushed foot with fall and it was repaired  . Esophagogastroduodenoscopy (egd) with propofol N/A 07/09/2012    SLF:The mucosa of the esophagus appeared normal/Small hiatal  hernia/MODERATE Non-erosive gastritis (inflammation) with hemorrhage was found in the gastric antrum; multiple biopsies (reactive gastropathy). No H.pylori. No varices.Next EGD 07/2014.    ROS:  General: Negative for anorexia, weight loss, fever, chills, weakness. Positive fatigue, chronic ENT: Negative for hoarseness, difficulty swallowing , nasal congestion. CV: Negative for chest pain, angina, palpitations, dyspnea on exertion, peripheral edema.  Respiratory: Negative for dyspnea at rest, dyspnea on exertion, cough, sputum, wheezing.  GI: See history of present illness. GU:  Negative for dysuria, hematuria, urinary incontinence, urinary frequency, nocturnal urination.  Endo: Negative for unusual weight change.     Physical Examination:   BP 153/84  Pulse 62  Temp(Src) 98 F (36.7 C) (Oral)  Ht 5\' 4"  (1.626 m)  Wt 144 lb 6.4 oz (65.499 kg)  BMI 24.77 kg/m2  General: Well-nourished, well-developed in no acute distress.  Eyes: No icterus. Mouth: Oropharyngeal mucosa moist and pink , no lesions erythema or exudate. Lungs: Clear to auscultation bilaterally.  Heart: Regular rate and rhythm, no murmurs rubs or gallops.  Abdomen: Bowel sounds are normal, nontender, nondistended, no hepatosplenomegaly or masses, no abdominal bruits or hernia , no rebound or guarding.   Extremities: No lower extremity edema. No clubbing or deformities. Neuro: Alert and oriented x 4   Skin: Warm and dry, no jaundice.  Numerous erythematous/scaly lesions noted on arms and chest. Psych: Alert and cooperative, normal mood and affect.

## 2012-12-31 LAB — COMPREHENSIVE METABOLIC PANEL
ALT: 41 U/L — ABNORMAL HIGH (ref 0–35)
Albumin: 3.7 g/dL (ref 3.5–5.2)
CO2: 30 mEq/L (ref 19–32)
Glucose, Bld: 90 mg/dL (ref 70–99)
Potassium: 4.5 mEq/L (ref 3.5–5.3)
Sodium: 135 mEq/L (ref 135–145)
Total Protein: 8.1 g/dL (ref 6.0–8.3)

## 2012-12-31 LAB — HCV RNA QUANT RFLX ULTRA OR GENOTYP
HCV Quantitative Log: 6.05 {Log} — ABNORMAL HIGH (ref <1.18)
HCV Quantitative: 1130723 IU/mL — ABNORMAL HIGH (ref <15)

## 2012-12-31 LAB — PROTIME-INR: INR: 1.11 (ref <1.50)

## 2012-12-31 NOTE — Progress Notes (Signed)
Quick Note:  Stable thrombocytopenia. MELD 8 Await genotype. ______

## 2013-01-02 NOTE — Progress Notes (Signed)
Quick Note:  HCV, genotype 1b. Please forward to [facility]. ______

## 2013-01-06 NOTE — Progress Notes (Signed)
Quick Note:  Pt returned call and was informed. She said she has not been referred to the [facility]. Forwarding to [employer]. ______

## 2013-01-06 NOTE — Progress Notes (Signed)
Quick Note:  Forwarding to [employer]. ______

## 2013-01-06 NOTE — Progress Notes (Signed)
Quick Note:  LM for pt to call for results. ______

## 2013-01-15 ENCOUNTER — Telehealth: Payer: Self-pay | Admitting: Gastroenterology

## 2013-01-15 NOTE — Telephone Encounter (Signed)
Mrs . Lavey is scheduled at the [facility] on Nov 5th at 10:30

## 2013-03-27 ENCOUNTER — Telehealth: Payer: Self-pay | Admitting: Gastroenterology

## 2013-03-27 ENCOUNTER — Encounter: Payer: Self-pay | Admitting: Gastroenterology

## 2013-03-27 NOTE — Telephone Encounter (Signed)
Jan recall has that patient needs MRI liver with and without contrast

## 2013-03-27 NOTE — Telephone Encounter (Signed)
Letter has been mailed to patient

## 2013-04-07 ENCOUNTER — Telehealth: Payer: Self-pay | Admitting: *Deleted

## 2013-04-07 DIAGNOSIS — B182 Chronic viral hepatitis C: Secondary | ICD-10-CM

## 2013-04-07 DIAGNOSIS — K7460 Unspecified cirrhosis of liver: Secondary | ICD-10-CM

## 2013-04-07 NOTE — Telephone Encounter (Signed)
Approved:[id number]

## 2013-04-07 NOTE — Telephone Encounter (Signed)
Case approved:[id number]

## 2013-04-07 NOTE — Telephone Encounter (Signed)
Pt is scheduled for mri 04/15/2013 with an arrival time of 9:45am, npo after midnight.  Pt is aware

## 2013-04-07 NOTE — Telephone Encounter (Signed)
Pt called wanting to schedule her MRI. Please advise [phone]

## 2013-04-07 NOTE — Telephone Encounter (Signed)
I called to get the pre-certification for the pts MRI, however it is going through clinical review and should receive a response within 48 hours.  [id number]   I called and informed the patient

## 2013-04-15 ENCOUNTER — Ambulatory Visit (HOSPITAL_COMMUNITY)
Admission: RE | Admit: 2013-04-15 | Discharge: 2013-04-15 | Disposition: A | Discharge status: Home / Self Care | Payer: Medicaid Other | Source: Ambulatory Visit | Attending: Gastroenterology | Admitting: Gastroenterology

## 2013-04-15 ENCOUNTER — Encounter (HOSPITAL_COMMUNITY): Payer: Self-pay

## 2013-04-15 DIAGNOSIS — K7460 Unspecified cirrhosis of liver: Secondary | ICD-10-CM

## 2013-04-15 DIAGNOSIS — B182 Chronic viral hepatitis C: Secondary | ICD-10-CM

## 2013-04-15 DIAGNOSIS — B1920 Unspecified viral hepatitis C without hepatic coma: Secondary | ICD-10-CM | POA: Insufficient documentation

## 2013-04-15 DIAGNOSIS — R161 Splenomegaly, not elsewhere classified: Secondary | ICD-10-CM | POA: Insufficient documentation

## 2013-04-15 LAB — POCT I-STAT CREATININE: CREATININE: 0.8 mg/dL (ref 0.50–1.10)

## 2013-04-15 MED ORDER — GADOXETATE DISODIUM 0.25 MMOL/ML IV SOLN
7.0000 mL | Freq: Once | INTRAVENOUS | Status: AC | PRN
  Administered 2013-04-15: 7 mL via INTRAVENOUS

## 2013-04-15 NOTE — Progress Notes (Signed)
Called and informed pt.  

## 2013-04-15 NOTE — Progress Notes (Addendum)
PLEASE CALL PT. HER MRI SHOWS CIRRHOSIS. SHE DOES NOT HAVE ANY SIGNS OF CANCER IN HER LIVER. SHE HAS HAD AN MRI OF HER LIVER FOR 5 YEARS THAT SHOWS CIRRHOSIS. SHE CAN HAVE U/S JAN 2016 FOR SCREENING. OPV E30 SLF CIRRHOSIS/HEP C MAR 2015.  REVIEWED.

## 2013-04-16 NOTE — Progress Notes (Signed)
Reminder in epic °

## 2013-04-21 ENCOUNTER — Telehealth: Payer: Self-pay | Admitting: Gastroenterology

## 2013-04-21 ENCOUNTER — Telehealth: Payer: Self-pay | Admitting: Obstetrics & Gynecology

## 2013-04-21 MED ORDER — ESTROGENS, CONJUGATED 0.625 MG/GM VA CREA: 1.0000 | TOPICAL_CREAM | Freq: Every day | VAGINAL | Status: DC

## 2013-04-21 NOTE — Telephone Encounter (Signed)
Pt called today asking when her next MRI is due. I couldn't find it in her chart. She just had one done recently and is to follow up with [facility] in March 2015. I told her that I would ask for someone to check behind me and also SF might will recommend another MRI after she sees patient in March. Please advise.

## 2013-04-21 NOTE — Telephone Encounter (Signed)
Changed to premarin from estrace per pt request

## 2013-04-21 NOTE — Telephone Encounter (Signed)
Routing to Dr. Fields.  

## 2013-04-23 NOTE — Telephone Encounter (Signed)
PER MY NOTE HAN 6:      Danie Binder, MD at 04/15/2013 3:07 PM     Status: Addendum        PLEASE CALL PT. HER MRI SHOWS CIRRHOSIS. SHE DOES NOT HAVE ANY SIGNS OF CANCER IN HER LIVER. SHE HAS HAD AN MRI OF HER LIVER FOR 5 YEARS THAT SHOWS CIRRHOSIS. SHE CAN HAVE U/S JAN 2016 FOR SCREENING. OPV E30 SLF CIRRHOSIS/HEP C MAR 2015.  REVIEWED.

## 2013-04-25 NOTE — Telephone Encounter (Signed)
Called home number and was told to call her cell number at [phone]. I called and got VM and left message for a return call.

## 2013-04-25 NOTE — Telephone Encounter (Signed)
Pt returned call and was informed.  

## 2013-04-26 ENCOUNTER — Other Ambulatory Visit: Payer: Self-pay | Admitting: Gastroenterology

## 2013-05-22 ENCOUNTER — Telehealth: Payer: Self-pay | Admitting: Obstetrics & Gynecology

## 2013-05-22 ENCOUNTER — Telehealth: Payer: Self-pay | Admitting: *Deleted

## 2013-05-22 MED ORDER — ESTROGENS, CONJUGATED 0.625 MG/GM VA CREA: TOPICAL_CREAM | VAGINAL | Status: DC

## 2013-05-22 NOTE — Telephone Encounter (Signed)
1 gram qhs

## 2013-05-22 NOTE — Telephone Encounter (Signed)
Spoke with pt letting her know Dr. Elonda Husky had spoke with pharmacy and Rx was taken care of. JSY

## 2013-07-02 ENCOUNTER — Encounter (INDEPENDENT_AMBULATORY_CARE_PROVIDER_SITE_OTHER): Payer: Self-pay

## 2013-07-02 ENCOUNTER — Other Ambulatory Visit (HOSPITAL_COMMUNITY): Payer: Self-pay | Admitting: Family Medicine

## 2013-07-02 ENCOUNTER — Ambulatory Visit (INDEPENDENT_AMBULATORY_CARE_PROVIDER_SITE_OTHER): Payer: Medicaid Other | Admitting: Gastroenterology

## 2013-07-02 ENCOUNTER — Encounter: Payer: Self-pay | Admitting: Gastroenterology

## 2013-07-02 VITALS — BP 139/80 | HR 53 | Temp 97.2°F | Ht 65.0 in | Wt 139.8 lb

## 2013-07-02 DIAGNOSIS — Z139 Encounter for screening, unspecified: Secondary | ICD-10-CM

## 2013-07-02 DIAGNOSIS — K7469 Other cirrhosis of liver: Principal | ICD-10-CM

## 2013-07-02 DIAGNOSIS — B1920 Unspecified viral hepatitis C without hepatic coma: Secondary | ICD-10-CM

## 2013-07-02 DIAGNOSIS — K7460 Unspecified cirrhosis of liver: Secondary | ICD-10-CM

## 2013-07-02 DIAGNOSIS — B182 Chronic viral hepatitis C: Secondary | ICD-10-CM

## 2013-07-02 NOTE — Patient Instructions (Addendum)
FOLLOW WITH [facility] FOR TREATMENT PROTOCOL FOR HEP C.  REPEAT AFP/ULTRASOUND IN JUL 2015  CALL ME AFTER YOUR VISIT WITH [facility] IN [place].  FOLLOW UP IN 6 MOS.

## 2013-07-02 NOTE — Assessment & Plan Note (Signed)
COMPENSATED DISEASE.  PENDING TREATMENT FOR HCV. REPEAT AFP/U/S JUL 2015 DISCUSSED BENEFITS V. RISK OF NADOLOL. REPEAT EGD IN 1-2 YEARS OPV IN 6 MOS

## 2013-07-02 NOTE — Progress Notes (Signed)
Subjective:    Patient ID: Tiffany Hale, female    DOB: 06-15-1955, 58 y.o.   MRN: [id number]  Leonides Grills, MD  HPI INTERESTED IN HEP C TREATMENT. CANCELLED APPT IN [place] DUE TO SON IN ACCIDENT. BMs: EVERY OTHER DAY. DRINKS WATER. CAN'T EAT FIBER BECAUSE OF HER TEETH. FEELS LIKE BELLY IS SWOLLEN: EVERY DAY. PT DENIES FEVER, CHILLS, BRBPR, nausea, vomiting, melena, diarrhea, abd pain, problems swallowing, OR heartburn or indigestion.   Past Medical History  Diagnosis Date  . Depression     problems concentrating, MHS ([facility])  . Asthma   . Hepatitis     C: Hepatitis B Sab POS June 2009  . HCV (hepatitis C virus) 2009  . HBV (hepatitis B virus) infection 2009: HBsAb POS  . Hepatitis A 2009 Ab POS  . Tobacco abuse   . Cirrhosis NOV 2011    MRI LIVER-5 SML DYSPLASTIC NODULES; 2009: TBILI 0.7 ALK PHOS 128 AST 56 ALT 53 ALB 3.8 INR 1.3  . BMI (body mass index) 20.0-29.9 2010 155 LBS  . Chronic back pain   . Arthritis     Past Surgical History  Procedure Laterality Date  . Extremity surgery      and Pelvic, after fall  . Esophagogastroduodenoscopy  MAR 2011    Grade 1 varices, GASTRITIS  . Total abdominal hysterectomy  1995 DUB  . Colonoscopy  OCT 2011     6 MM SIMPLE ADENOMA, BENIGN COLONIC MUCOSA  . Foot surgery Right 2009    [facility]-crushed foot with fall and it was repaired  . Esophagogastroduodenoscopy (egd) with propofol N/A 07/09/2012    SLF:The mucosa of the esophagus appeared normal/Small hiatal hernia/MODERATE Non-erosive gastritis (inflammation) with hemorrhage was found in the gastric antrum; multiple biopsies (reactive gastropathy). No H.pylori. No varices.Next EGD 07/2014.   No Known Allergies  Current Outpatient Prescriptions  Medication Sig Dispense Refill  . ARIPiprazole (ABILIFY) 15 MG tablet Take 15 mg by mouth daily.        Marland Kitchen conjugated estrogens (PREMARIN) vaginal cream Use 1 gram a night    . escitalopram (LEXAPRO) 20 MG tablet Take 30 mg by mouth  daily.     Marland Kitchen estradiol (ESTRACE) 0.1 MG/GM vaginal cream Place 2.42 Applicatorfuls vaginally daily. Use every other night    . methylphenidate (CONCERTA) 36 MG CR tablet Take 36 mg by mouth every morning.      . nadolol (CORGARD) 20 MG tablet TAKE 1 TABLET BY MOUTH ONCE DAILY.    Marland Kitchen oxycodone (OXY-IR) 5 MG capsule Take 5 mg by mouth every 4 (four) hours as needed.      . traZODone (DESYREL) 150 MG tablet Take 150 mg by mouth at bedtime.       Marland Kitchen zolpidem (AMBIEN) 10 MG tablet Take 10 mg by mouth at bedtime.        Review of Systems     Objective:   Physical Exam  Vitals reviewed. Constitutional: She is oriented to person, place, and time. She appears well-nourished. No distress.  HENT:  Head: Normocephalic and atraumatic.  Mouth/Throat: Oropharynx is clear and moist. No oropharyngeal exudate.  POOR DENTITION  Eyes: Pupils are equal, round, and reactive to light. No scleral icterus.  Neck: Normal range of motion. Neck supple.  Cardiovascular: Normal rate and normal heart sounds.   Pulmonary/Chest: Effort normal and breath sounds normal. No respiratory distress.  Abdominal: Soft. Bowel sounds are normal. She exhibits no distension. There is no tenderness.  Musculoskeletal: She exhibits  no edema.  Lymphadenopathy:    She has no cervical adenopathy.  Neurological: She is alert and oriented to person, place, and time.  NO FOCAL DEFICITS   Psychiatric: She has a normal mood and affect.          Assessment & Plan:

## 2013-07-02 NOTE — Progress Notes (Signed)
cc'd to pcp 

## 2013-07-03 NOTE — Progress Notes (Signed)
Reminder in epic °

## 2013-07-04 ENCOUNTER — Ambulatory Visit (HOSPITAL_COMMUNITY)
Admission: RE | Admit: 2013-07-04 | Discharge: 2013-07-04 | Disposition: A | Discharge status: Home / Self Care | Payer: Medicaid Other | Source: Ambulatory Visit | Attending: Family Medicine | Admitting: Family Medicine

## 2013-07-04 DIAGNOSIS — Z1231 Encounter for screening mammogram for malignant neoplasm of breast: Secondary | ICD-10-CM | POA: Insufficient documentation

## 2013-07-04 DIAGNOSIS — Z139 Encounter for screening, unspecified: Secondary | ICD-10-CM

## 2013-07-23 ENCOUNTER — Encounter: Payer: Self-pay | Admitting: Obstetrics & Gynecology

## 2013-07-23 ENCOUNTER — Ambulatory Visit (INDEPENDENT_AMBULATORY_CARE_PROVIDER_SITE_OTHER): Payer: Medicaid Other | Admitting: Obstetrics & Gynecology

## 2013-07-23 VITALS — BP 140/80 | Ht 64.0 in | Wt 141.0 lb

## 2013-07-23 DIAGNOSIS — Z1212 Encounter for screening for malignant neoplasm of rectum: Secondary | ICD-10-CM

## 2013-07-23 DIAGNOSIS — Z01419 Encounter for gynecological examination (general) (routine) without abnormal findings: Secondary | ICD-10-CM

## 2013-07-23 DIAGNOSIS — Z0000 Encounter for general adult medical examination without abnormal findings: Secondary | ICD-10-CM

## 2013-07-23 MED ORDER — ESTROGENS, CONJUGATED 0.625 MG/GM VA CREA: TOPICAL_CREAM | VAGINAL | Status: DC

## 2013-07-23 NOTE — Progress Notes (Signed)
Patient ID: Tiffany Hale, female   DOB: [birth year], 58 y.o.   MRN: [id number] Subjective:     Tiffany Hale is a 58 y.o. female here for a routine exam.  No LMP recorded. Patient has had a hysterectomy. No obstetric history on file. Birth Control Method:  na Menstrual Calendar(currently): na  Current complaints: none.   Current acute medical issues:  multiple   Recent Gynecologic History No LMP recorded. Patient has had a hysterectomy. Last Pap: na,   Last mammogram: 2015,  normal  Past Medical History  Diagnosis Date  . Depression     problems concentrating, MHS ([facility])  . Asthma   . Hepatitis     C: Hepatitis B Sab POS June 2009  . HCV (hepatitis C virus) 2009  . HBV (hepatitis B virus) infection 2009: HBsAb POS  . Hepatitis A 2009 Ab POS  . Tobacco abuse   . Cirrhosis NOV 2011    MRI LIVER-5 SML DYSPLASTIC NODULES; 2009: TBILI 0.7 ALK PHOS 128 AST 56 ALT 53 ALB 3.8 INR 1.3  . BMI (body mass index) 20.0-29.9 2010 155 LBS  . Chronic back pain   . Arthritis     Past Surgical History  Procedure Laterality Date  . Extremity surgery      and Pelvic, after fall  . Esophagogastroduodenoscopy  MAR 2011    Grade 1 varices, GASTRITIS  . Total abdominal hysterectomy  1995 DUB  . Colonoscopy  OCT 2011     6 MM SIMPLE ADENOMA, BENIGN COLONIC MUCOSA  . Foot surgery Right 2009    [facility]-crushed foot with fall and it was repaired  . Esophagogastroduodenoscopy (egd) with propofol N/A 07/09/2012    SLF:The mucosa of the esophagus appeared normal/Small hiatal hernia/MODERATE Non-erosive gastritis (inflammation) with hemorrhage was found in the gastric antrum; multiple biopsies (reactive gastropathy). No H.pylori. No varices.Next EGD 07/2014.    OB History   Grav Para Term Preterm Abortions TAB SAB Ect Mult Living                  History   Social History  . Marital Status: Divorced    Spouse Name: N/A    Number of Children: N/A  . Years of Education: N/A   Social  History Main Topics  . Smoking status: Former Smoker -- 40 years    Types: Cigarettes    Quit date: 11/22/1916  . Smokeless tobacco: None     Comment: 3 days to go through a pack   . Alcohol Use: No     Comment: history of ETOH abuse, no alcohol X 10 years   . Drug Use: No  . Sexual Activity: Not Currently   Other Topics Concern  . None   Social History Narrative  . None    Family History  Problem Relation Age of Onset  . Colon cancer Neg Hx   . Colon polyps Neg Hx   . Birth defects Brother   . Cancer Maternal Grandmother   . Cancer Maternal Grandfather      Review of Systems  Review of Systems  Constitutional: Negative for fever, chills, weight loss, malaise/fatigue and diaphoresis.  HENT: Negative for hearing loss, ear pain, nosebleeds, congestion, sore throat, neck pain, tinnitus and ear discharge.   Eyes: Negative for blurred vision, double vision, photophobia, pain, discharge and redness.  Respiratory: Negative for cough, hemoptysis, sputum production, shortness of breath, wheezing and stridor.   Cardiovascular: Negative for chest pain, palpitations, orthopnea, claudication, leg swelling  and PND.  Gastrointestinal: negative for abdominal pain. Negative for heartburn, nausea, vomiting, diarrhea, constipation, blood in stool and melena.  Genitourinary: Negative for dysuria, urgency, frequency, hematuria and flank pain.  Musculoskeletal: Negative for myalgias, back pain, joint pain and falls.  Skin: Negative for itching and rash.  Neurological: Negative for dizziness, tingling, tremors, sensory change, speech change, focal weakness, seizures, loss of consciousness, weakness and headaches.  Endo/Heme/Allergies: Negative for environmental allergies and polydipsia. Does not bruise/bleed easily.  Psychiatric/Behavioral: Negative for depression, suicidal ideas, hallucinations, memory loss and substance abuse. The patient is not nervous/anxious and does not have insomnia.         Objective:    Physical Exam  Vitals reviewed. Constitutional: She is oriented to person, place, and time. She appears well-developed and well-nourished.  HENT:  Head: Normocephalic and atraumatic.        Right Ear: External ear normal.  Left Ear: External ear normal.  Nose: Nose normal.  Mouth/Throat: Oropharynx is clear and moist.  Eyes: Conjunctivae and EOM are normal. Pupils are equal, round, and reactive to light. Right eye exhibits no discharge. Left eye exhibits no discharge. No scleral icterus.  Neck: Normal range of motion. Neck supple. No tracheal deviation present. No thyromegaly present.  Cardiovascular: Normal rate, regular rhythm, normal heart sounds and intact distal pulses.  Exam reveals no gallop and no friction rub.   No murmur heard. Respiratory: Effort normal and breath sounds normal. No respiratory distress. She has no wheezes. She has no rales. She exhibits no tenderness.  GI: Soft. Bowel sounds are normal. She exhibits no distension and no mass. There is no tenderness. There is no rebound and no guarding.  Genitourinary:  Breasts no masses skin changes or nipple changes bilaterally      Vulva is normal without lesions Vagina is pink moist without discharge Cervix absent Uterus is absent Adnexa is negative Rectal    hemoccult negative, normal tone, no masses  Musculoskeletal: Normal range of motion. She exhibits no edema and no tenderness.  Neurological: She is alert and oriented to person, place, and time. She has normal reflexes. She displays normal reflexes. No cranial nerve deficit. She exhibits normal muscle tone. Coordination normal.  Skin: Skin is warm and dry. No rash noted. No erythema. No pallor.  Psychiatric: She has a normal mood and affect. Her behavior is normal. Judgment and thought content normal.       Assessment:    Healthy female exam.    Plan:    Follow up in: 1 year.

## 2013-09-10 ENCOUNTER — Other Ambulatory Visit: Payer: Self-pay | Admitting: Nurse Practitioner

## 2013-09-10 DIAGNOSIS — C220 Liver cell carcinoma: Secondary | ICD-10-CM

## 2013-09-12 ENCOUNTER — Ambulatory Visit
Admission: RE | Admit: 2013-09-12 | Discharge: 2013-09-12 | Disposition: A | Discharge status: Home / Self Care | Payer: Medicaid Other | Source: Ambulatory Visit | Attending: Nurse Practitioner | Admitting: Nurse Practitioner

## 2013-09-12 DIAGNOSIS — C220 Liver cell carcinoma: Secondary | ICD-10-CM

## 2013-09-17 ENCOUNTER — Other Ambulatory Visit: Payer: Medicaid Other

## 2013-09-19 ENCOUNTER — Other Ambulatory Visit: Payer: Self-pay | Admitting: Gastroenterology

## 2013-11-06 ENCOUNTER — Telehealth: Payer: Self-pay | Admitting: Gastroenterology

## 2013-11-06 NOTE — Telephone Encounter (Signed)
Pt is on the July recall to have repeat AFP and Ultrasound in July

## 2013-11-06 NOTE — Telephone Encounter (Signed)
Had an abd u/s 09/12/2013 does she need another, please advise?

## 2013-11-19 NOTE — Telephone Encounter (Signed)
NO NEED FOR REPEAT U/S. NEXT RUQ U/S IN DEC 2015. PT NEEDS OPV IN AUG OR SEP E30 CIRRHOSIS.

## 2013-11-20 ENCOUNTER — Encounter: Payer: Self-pay | Admitting: Gastroenterology

## 2013-11-20 NOTE — Telephone Encounter (Signed)
NOTED

## 2013-11-20 NOTE — Telephone Encounter (Signed)
Pt is aware of OV on 9/30 at 0930 with SF and appt card mailed

## 2013-12-09 ENCOUNTER — Encounter: Payer: Self-pay | Admitting: Gastroenterology

## 2013-12-29 ENCOUNTER — Other Ambulatory Visit: Payer: Self-pay | Admitting: Nurse Practitioner

## 2013-12-29 DIAGNOSIS — C220 Liver cell carcinoma: Secondary | ICD-10-CM

## 2014-01-07 ENCOUNTER — Ambulatory Visit: Payer: Medicaid Other | Admitting: Gastroenterology

## 2014-01-22 ENCOUNTER — Other Ambulatory Visit: Payer: Medicaid Other

## 2014-01-26 ENCOUNTER — Ambulatory Visit
Admission: RE | Admit: 2014-01-26 | Discharge: 2014-01-26 | Disposition: A | Discharge status: Home / Self Care | Payer: Medicaid Other | Source: Ambulatory Visit | Attending: Nurse Practitioner | Admitting: Nurse Practitioner

## 2014-01-26 ENCOUNTER — Encounter (INDEPENDENT_AMBULATORY_CARE_PROVIDER_SITE_OTHER): Payer: Self-pay

## 2014-01-26 DIAGNOSIS — C220 Liver cell carcinoma: Secondary | ICD-10-CM

## 2014-02-17 ENCOUNTER — Telehealth: Payer: Self-pay | Admitting: Gastroenterology

## 2014-02-17 NOTE — Telephone Encounter (Signed)
DEC RECALL LIST FOR RUQ [place]

## 2014-02-17 NOTE — Telephone Encounter (Signed)
Letter mailed

## 2014-03-31 NOTE — Telephone Encounter (Signed)
Pt called this morning to let us know that she had a US done at [facility] last month. I told her we can ask SLF at her appointment Wednesday.

## 2014-03-31 NOTE — Telephone Encounter (Addendum)
PT LAST SEEN MAR 2015. SHE IS CHILD PUGH A. NO NEED FOR NADOLOL.

## 2014-03-31 NOTE — Telephone Encounter (Signed)
Informed pharmacy.

## 2014-04-01 ENCOUNTER — Ambulatory Visit (INDEPENDENT_AMBULATORY_CARE_PROVIDER_SITE_OTHER): Payer: Medicaid Other | Admitting: Gastroenterology

## 2014-04-01 ENCOUNTER — Encounter: Payer: Self-pay | Admitting: Gastroenterology

## 2014-04-01 VITALS — BP 143/80 | HR 77 | Temp 98.5°F | Ht 64.0 in | Wt 123.6 lb

## 2014-04-01 DIAGNOSIS — B182 Chronic viral hepatitis C: Secondary | ICD-10-CM

## 2014-04-01 DIAGNOSIS — K7460 Unspecified cirrhosis of liver: Secondary | ICD-10-CM

## 2014-04-01 DIAGNOSIS — K7469 Other cirrhosis of liver: Secondary | ICD-10-CM

## 2014-04-01 DIAGNOSIS — D126 Benign neoplasm of colon, unspecified: Secondary | ICD-10-CM | POA: Insufficient documentation

## 2014-04-01 DIAGNOSIS — B1920 Unspecified viral hepatitis C without hepatic coma: Secondary | ICD-10-CM

## 2014-04-01 MED ORDER — OMEPRAZOLE 20 MG PO CPDR: DELAYED_RELEASE_CAPSULE | ORAL | Status: DC

## 2014-04-01 NOTE — Progress Notes (Signed)
ON RECALL LIST  °

## 2014-04-01 NOTE — Progress Notes (Signed)
 Subjective:    Patient ID: Tiffany Hale, female    DOB: 10/27/1955, 58 y.o.   MRN: [id number]  MCGOUGH,WILLIAM M, MD  HPI Treated for HCV-RBV/VIEKIRA. 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 LAST HCV OCT 2015: UNDETECTABLE. HAS LABS JAN 2016-OPV IN FEB 2015-OCCASIONAL EPIGASTRIC PAIN. FEELS BETTER. BMs: OK. USES IBUPROFEN OTC PRN FOR SEVERE BACKPAIN: 2-3 TIMES A WEEK.   PT DENIES FEVER, CHILLS, HEMATOCHEZIA,  nausea, vomiting, melena, diarrhea, CHEST PAIN, SHORTNESS OF BREATH, constipation, problems swallowing, OR heartburn or indigestion. NO SWELLING IN LEGS OR ABDOMEN.  Past Medical History  Diagnosis Date  . Depression     problems concentrating, MHS ([facility])  . Asthma   . Hepatitis     C: Hepatitis B Sab POS June 2009  . HCV (hepatitis C virus) 2009  . HBV (hepatitis B virus) infection 2009: HBsAb POS  . Hepatitis A 2009 Ab POS  . Tobacco abuse   . Cirrhosis NOV 2011    MRI LIVER-5 SML DYSPLASTIC NODULES; 2009: TBILI 0.7 ALK PHOS 128 AST 56 ALT 53 ALB 3.8 INR 1.3  . BMI (body mass index) 20.0-29.9 2010 155 LBS  . Chronic back pain   . Arthritis    Past Surgical History  Procedure Laterality Date  . Extremity surgery      and Pelvic, after fall  . Esophagogastroduodenoscopy  MAR 2011    Grade 1 varices, GASTRITIS  . Total abdominal hysterectomy  1995 DUB  . Colonoscopy  OCT 2011     6 MM SIMPLE ADENOMA, BENIGN COLONIC MUCOSA  . Foot surgery Right 2009    [facility]-crushed foot with fall and it was repaired  . Esophagogastroduodenoscopy (egd) with propofol N/A 07/09/2012    SLF:The mucosa of the esophagus appeared normal/Small hiatal hernia/MODERATE Non-erosive gastritis (inflammation) with hemorrhage was found in the gastric antrum; multiple biopsies (reactive gastropathy). No H.pylori. No varices.Next EGD 07/2014.   No Known Allergies  Current Outpatient Prescriptions  Medication Sig Dispense Refill  . ARIPiprazole (ABILIFY) 15 MG tablet Take 15 mg by mouth daily.      . conjugated  estrogens (PREMARIN) vaginal cream Use 1 gram a night    . escitalopram (LEXAPRO) 20 MG tablet Take 30 mg by mouth daily.     . methylphenidate (CONCERTA) 36 MG CR tablet Take 36 mg by mouth every morning.      . traZODone (DESYREL) 150 MG tablet Take 150 mg by mouth at bedtime.     . zolpidem (AMBIEN) 10 MG tablet Take 10 mg by mouth at bedtime.    .      .      . oxycodone (OXY-IR) 5 MG capsule Take 5 mg by mouth every 4 (four) hours as needed.     Review of Systems     Objective:   Physical Exam  Constitutional: She is oriented to person, place, and time. She appears well-developed and well-nourished. No distress.  HENT:  Head: Normocephalic and atraumatic.  Mouth/Throat: Oropharynx is clear and moist. No oropharyngeal exudate.  Eyes: Pupils are equal, round, and reactive to light. No scleral icterus.  Neck: Normal range of motion. Neck supple.  Cardiovascular: Normal rate, regular rhythm and normal heart sounds.   Pulmonary/Chest: Effort normal and breath sounds normal. No respiratory distress.  Abdominal: Soft. Bowel sounds are normal. She exhibits no distension. There is no tenderness.  Musculoskeletal: She exhibits no edema.  Lymphadenopathy:    She has no cervical adenopathy.  Neurological: She is alert and oriented to person, place,   and time.  NO FOCAL DEFICITS   Psychiatric: She has a normal mood and affect.  Vitals reviewed.         Assessment & Plan:

## 2014-04-01 NOTE — Patient Instructions (Signed)
REPEAT LABS VIA [facility] IN JAN 2016.  YOU DO NOT NEED FOR NADOLOL.  ADD PRILOSEC WHEN USING IBUPROFEN FOR BACK PAIN.  UPPER ENDOSCOPY IN APR 2016.   FOLLOW UP IN JUN 2016

## 2014-04-01 NOTE — Assessment & Plan Note (Addendum)
WELL COMPENSATED DISEASE.   REPEAT LABS VIA [facility] JAN 2016. OBTAIN LABS FROM [facility]. NO NEED FOR NADOLOL. ADD PRILOSEC WHEN USING IBUPROFEN FOR BACK PAIN EGD APR 2016 W/ MAC OPV IN JUN 2016

## 2014-04-01 NOTE — Assessment & Plan Note (Signed)
LAST TCS 6-8 MM SIMPLE ADENOMA  NEXT TCS IN 2016-2021

## 2014-04-07 NOTE — Progress Notes (Signed)
cc'ed to pcp °

## 2014-05-18 ENCOUNTER — Other Ambulatory Visit: Payer: Self-pay | Admitting: Nurse Practitioner

## 2014-05-18 DIAGNOSIS — C220 Liver cell carcinoma: Secondary | ICD-10-CM

## 2014-06-30 ENCOUNTER — Telehealth: Payer: Self-pay | Admitting: Gastroenterology

## 2014-06-30 ENCOUNTER — Other Ambulatory Visit: Payer: Self-pay | Admitting: Obstetrics & Gynecology

## 2014-06-30 DIAGNOSIS — Z1231 Encounter for screening mammogram for malignant neoplasm of breast: Secondary | ICD-10-CM

## 2014-06-30 NOTE — Telephone Encounter (Signed)
PATIENT ON RECALL FOR ENDO IN April

## 2014-07-01 ENCOUNTER — Telehealth: Payer: Self-pay

## 2014-07-01 ENCOUNTER — Other Ambulatory Visit: Payer: Self-pay

## 2014-07-01 NOTE — Telephone Encounter (Signed)
Noted and pt is aware of date and time change for procedure.   Instructions have been mailed.

## 2014-07-01 NOTE — Telephone Encounter (Signed)
PT NEEDS CMP, PT/INR, CBC, AND AFP WITH PREOP LABS.

## 2014-07-01 NOTE — Telephone Encounter (Signed)
Noted and orders have been place in computer for pre-op appt.

## 2014-07-01 NOTE — Telephone Encounter (Signed)
EGD W/ MAC

## 2014-07-01 NOTE — Telephone Encounter (Signed)
Pt is scheduled for 07/24/2014 however after review previous EGD it was done with propofol. Please advise so I can enter orders accordingly.

## 2014-07-01 NOTE — Telephone Encounter (Signed)
Called pt and updated her H&P.  Pt is on recall list for repeat EGD in April.  Pt denies any changes to her medications and they were reviewed.  Pt is scheduled for 04/15/016 @ 1015am.

## 2014-07-08 ENCOUNTER — Ambulatory Visit
Admission: RE | Admit: 2014-07-08 | Discharge: 2014-07-08 | Disposition: A | Discharge status: Home / Self Care | Payer: Medicaid Other | Source: Ambulatory Visit | Attending: Nurse Practitioner | Admitting: Nurse Practitioner

## 2014-07-08 DIAGNOSIS — C220 Liver cell carcinoma: Secondary | ICD-10-CM

## 2014-07-09 ENCOUNTER — Other Ambulatory Visit: Payer: Self-pay

## 2014-07-13 NOTE — Patient Instructions (Signed)
Tiffany Hale  07/13/2014   Your procedure is scheduled on:   07/21/2014  Report to [facility] at  [place]  AM.  Call this number if you have problems the morning of surgery: [phone]   Remember:   Do not eat food or drink liquids after midnight.   Take these medicines the morning of surgery with A SIP OF WATER:  Abilify, lexapro, concerta, nadolol, prilosec, oxycodone   Do not wear jewelry, make-up or nail polish.  Do not wear lotions, powders, or perfumes.   Do not shave 48 hours prior to surgery. Men may shave face and neck.  Do not bring valuables to the hospital.  [facility] is not responsible for any belongings or valuables.               Contacts, dentures or bridgework may not be worn into surgery.  Leave suitcase in the car. After surgery it may be brought to your room.  For patients admitted to the hospital, discharge time is determined by your treatment team.               Patients discharged the day of surgery will not be allowed to drive home.  Name and phone number of your driver: family  Special Instructions: N/A   Please read over the following fact sheets that you were given: Pain Booklet, Coughing and Deep Breathing, Surgical Site Infection Prevention, Anesthesia Post-op Instructions and Care and Recovery After Surgery Esophagogastroduodenoscopy Esophagogastroduodenoscopy (EGD) is a procedure to examine the lining of the esophagus, stomach, and first part of the small intestine (duodenum). A long, flexible, lighted tube with a camera attached (endoscope) is inserted down the throat to view these organs. This procedure is done to detect problems or abnormalities, such as inflammation, bleeding, ulcers, or growths, in order to treat them. The procedure lasts about 5-20 minutes. It is usually an outpatient procedure, but it may need to be performed in emergency cases in the hospital. LET YOUR CAREGIVER KNOW ABOUT:   Allergies to food or medicine.  All  medicines you are taking, including vitamins, herbs, eyedrops, and over-the-counter medicines and creams.  Use of steroids (by mouth or creams).  Previous problems you or members of your family have had with the use of anesthetics.  Any blood disorders you have.  Previous surgeries you have had.  Other health problems you have.  Possibility of pregnancy, if this applies. RISKS AND COMPLICATIONS  Generally, EGD is a safe procedure. However, as with any procedure, complications can occur. Possible complications include:  Infection.  Bleeding.  Tearing (perforation) of the esophagus, stomach, or duodenum.  Difficulty breathing or not being able to breath.  Excessive sweating.  Spasms of the larynx.  Slowed heartbeat.  Low blood pressure. BEFORE THE PROCEDURE  Do not eat or drink anything for 6-8 hours before the procedure or as directed by your caregiver.  Ask your caregiver about changing or stopping your regular medicines.  If you wear dentures, be prepared to remove them before the procedure.  Arrange for someone to drive you home after the procedure. PROCEDURE   A vein will be accessed to give medicines and fluids. A medicine to relax you (sedative) and a pain reliever will be given through that access into the vein.  A numbing medicine (local anesthetic) may be sprayed on your throat for comfort and to stop you from gagging or coughing.  A mouth guard may be placed in your mouth  to protect your teeth and to keep you from biting on the endoscope.  You will be asked to lie on your left side.  The endoscope is inserted down your throat and into the esophagus, stomach, and duodenum.  Air is put through the endoscope to allow your caregiver to view the lining of your esophagus clearly.  The esophagus, stomach, and duodenum is then examined. During the exam, your caregiver may:  Remove tissue to be examined under a microscope (biopsy) for inflammation, infection,  or other medical problems.  Remove growths.  Remove objects (foreign bodies) that are stuck.  Treat any bleeding with medicines or other devices that stop tissues from bleeding (hot cautery, clipping devices).  Widen (dilate) or stretch narrowed areas of the esophagus and stomach.  The endoscope will then be withdrawn. AFTER THE PROCEDURE  You will be taken to a recovery area to be monitored. You will be able to go home once you are stable and alert.  Do not eat or drink anything until the local anesthetic and numbing medicines have worn off. You may choke.  It is normal to feel bloated, have pain with swallowing, or have a sore throat for a short time. This will wear off.  Your caregiver should be able to discuss his or her findings with you. It will take longer to discuss the test results if any biopsies were taken. Document Released: 07/28/2004 Document Revised: 08/11/2013 Document Reviewed: 02/28/2012 [facility] Patient Information 2015 [place], [place]. This information is not intended to replace advice given to you by your health care provider. Make sure you discuss any questions you have with your health care provider. PATIENT INSTRUCTIONS POST-ANESTHESIA  IMMEDIATELY FOLLOWING SURGERY:  Do not drive or operate machinery for the first twenty four hours after surgery.  Do not make any important decisions for twenty four hours after surgery or while taking narcotic pain medications or sedatives.  If you develop intractable nausea and vomiting or a severe headache please notify your doctor immediately.  FOLLOW-UP:  Please make an appointment with your surgeon as instructed. You do not need to follow up with anesthesia unless specifically instructed to do so.  WOUND CARE INSTRUCTIONS (if applicable):  Keep a dry clean dressing on the anesthesia/puncture wound site if there is drainage.  Once the wound has quit draining you may leave it open to air.  Generally you should leave the bandage  intact for twenty four hours unless there is drainage.  If the epidural site drains for more than 36-48 hours please call the anesthesia department.  QUESTIONS?:  Please feel free to call your physician or the hospital operator if you have any questions, and they will be happy to assist you.

## 2014-07-15 ENCOUNTER — Encounter (HOSPITAL_COMMUNITY)
Admission: RE | Admit: 2014-07-15 | Discharge: 2014-07-15 | Disposition: A | Discharge status: Home / Self Care | Payer: Medicaid Other | Source: Ambulatory Visit | Attending: Gastroenterology | Admitting: Gastroenterology

## 2014-07-15 ENCOUNTER — Encounter (HOSPITAL_COMMUNITY): Payer: Self-pay

## 2014-07-15 ENCOUNTER — Ambulatory Visit (HOSPITAL_COMMUNITY)
Admission: RE | Admit: 2014-07-15 | Discharge: 2014-07-15 | Disposition: A | Discharge status: Home / Self Care | Payer: Medicaid Other | Source: Ambulatory Visit | Attending: Obstetrics & Gynecology | Admitting: Obstetrics & Gynecology

## 2014-07-15 DIAGNOSIS — Z01810 Encounter for preprocedural cardiovascular examination: Secondary | ICD-10-CM | POA: Insufficient documentation

## 2014-07-15 DIAGNOSIS — Z1231 Encounter for screening mammogram for malignant neoplasm of breast: Secondary | ICD-10-CM | POA: Insufficient documentation

## 2014-07-15 DIAGNOSIS — K7460 Unspecified cirrhosis of liver: Secondary | ICD-10-CM | POA: Insufficient documentation

## 2014-07-15 LAB — COMPREHENSIVE METABOLIC PANEL
ALT: 16 U/L (ref 0–35)
AST: 23 U/L (ref 0–37)
Albumin: 4.1 g/dL (ref 3.5–5.2)
Alkaline Phosphatase: 88 U/L (ref 39–117)
Anion gap: 5 (ref 5–15)
BUN: 18 mg/dL (ref 6–23)
CALCIUM: 9.4 mg/dL (ref 8.4–10.5)
CO2: 31 mmol/L (ref 19–32)
Chloride: 104 mmol/L (ref 96–112)
Creatinine, Ser: 0.77 mg/dL (ref 0.50–1.10)
GFR calc Af Amer: >90 mL/min (ref >90)
GFR calc non Af Amer: 89 mL/min — ABNORMAL LOW (ref >90)
GLUCOSE: 106 mg/dL — AB (ref 70–99)
Potassium: 4.3 mmol/L (ref 3.5–5.1)
SODIUM: 140 mmol/L (ref 135–145)
TOTAL PROTEIN: 7.7 g/dL (ref 6.0–8.3)
Total Bilirubin: 0.3 mg/dL (ref 0.3–1.2)

## 2014-07-15 LAB — CBC
HCT: 36.2 % (ref 36.0–46.0)
Hemoglobin: 12.5 g/dL (ref 12.0–15.0)
MCH: 32 pg (ref 26.0–34.0)
MCHC: 34.5 g/dL (ref 30.0–36.0)
MCV: 92.6 fL (ref 78.0–100.0)
PLATELETS: 68 10*3/uL — AB (ref 150–400)
RBC: 3.91 MIL/uL (ref 3.87–5.11)
RDW: 13.2 % (ref 11.5–15.5)
WBC: 2.6 10*3/uL — ABNORMAL LOW (ref 4.0–10.5)

## 2014-07-15 LAB — PROTIME-INR
INR: 1.16 (ref 0.00–1.49)
Prothrombin Time: 14.9 seconds (ref 11.6–15.2)

## 2014-07-15 LAB — SURGICAL PCR SCREEN
MRSA, PCR: NEGATIVE
Staphylococcus aureus: NEGATIVE

## 2014-07-16 LAB — AFP TUMOR MARKER: AFP TUMOR MARKER: 2.6 ng/mL (ref 0.0–8.3)

## 2014-07-21 ENCOUNTER — Encounter (HOSPITAL_COMMUNITY): Payer: Self-pay | Admitting: *Deleted

## 2014-07-21 ENCOUNTER — Ambulatory Visit (HOSPITAL_COMMUNITY): Payer: Medicaid Other | Admitting: Anesthesiology

## 2014-07-21 ENCOUNTER — Ambulatory Visit (HOSPITAL_COMMUNITY)
Admission: RE | Admit: 2014-07-21 | Discharge: 2014-07-21 | Disposition: A | Discharge status: Home / Self Care | Payer: Medicaid Other | Source: Ambulatory Visit | Attending: Gastroenterology | Admitting: Gastroenterology

## 2014-07-21 ENCOUNTER — Encounter (HOSPITAL_COMMUNITY)
Admission: RE | Disposition: A | Discharge status: Home / Self Care | Payer: Self-pay | Source: Ambulatory Visit | Attending: Gastroenterology

## 2014-07-21 DIAGNOSIS — M549 Dorsalgia, unspecified: Secondary | ICD-10-CM | POA: Diagnosis not present

## 2014-07-21 DIAGNOSIS — B182 Chronic viral hepatitis C: Secondary | ICD-10-CM

## 2014-07-21 DIAGNOSIS — B1920 Unspecified viral hepatitis C without hepatic coma: Secondary | ICD-10-CM | POA: Diagnosis not present

## 2014-07-21 DIAGNOSIS — K2950 Unspecified chronic gastritis without bleeding: Secondary | ICD-10-CM | POA: Diagnosis not present

## 2014-07-21 DIAGNOSIS — F329 Major depressive disorder, single episode, unspecified: Secondary | ICD-10-CM | POA: Diagnosis not present

## 2014-07-21 DIAGNOSIS — Z87891 Personal history of nicotine dependence: Secondary | ICD-10-CM | POA: Diagnosis not present

## 2014-07-21 DIAGNOSIS — M1990 Unspecified osteoarthritis, unspecified site: Secondary | ICD-10-CM | POA: Insufficient documentation

## 2014-07-21 DIAGNOSIS — K8020 Calculus of gallbladder without cholecystitis without obstruction: Secondary | ICD-10-CM | POA: Diagnosis not present

## 2014-07-21 DIAGNOSIS — J45909 Unspecified asthma, uncomplicated: Secondary | ICD-10-CM | POA: Diagnosis not present

## 2014-07-21 DIAGNOSIS — G8929 Other chronic pain: Secondary | ICD-10-CM | POA: Insufficient documentation

## 2014-07-21 DIAGNOSIS — Z79899 Other long term (current) drug therapy: Secondary | ICD-10-CM | POA: Diagnosis not present

## 2014-07-21 DIAGNOSIS — Z0389 Encounter for observation for other suspected diseases and conditions ruled out: Secondary | ICD-10-CM | POA: Diagnosis present

## 2014-07-21 DIAGNOSIS — Z79891 Long term (current) use of opiate analgesic: Secondary | ICD-10-CM | POA: Insufficient documentation

## 2014-07-21 DIAGNOSIS — K7460 Unspecified cirrhosis of liver: Secondary | ICD-10-CM | POA: Diagnosis not present

## 2014-07-21 HISTORY — PX: BIOPSY: SHX5522

## 2014-07-21 HISTORY — PX: ESOPHAGOGASTRODUODENOSCOPY (EGD) WITH PROPOFOL: SHX5813

## 2014-07-21 SURGERY — ESOPHAGOGASTRODUODENOSCOPY (EGD) WITH PROPOFOL
Anesthesia: Monitor Anesthesia Care

## 2014-07-21 MED ORDER — LIDOCAINE VISCOUS 2 % MT SOLN
15.0000 mL | Freq: Once | OROMUCOSAL | Status: AC
  Administered 2014-07-21: 15 mL via OROMUCOSAL

## 2014-07-21 MED ORDER — ONDANSETRON HCL 4 MG/2ML IJ SOLN
INTRAMUSCULAR | Status: AC
  Filled 2014-07-21: qty 2

## 2014-07-21 MED ORDER — SUCCINYLCHOLINE CHLORIDE 20 MG/ML IJ SOLN
INTRAMUSCULAR | Status: AC
  Filled 2014-07-21: qty 3

## 2014-07-21 MED ORDER — PROPOFOL 10 MG/ML IV BOLUS
INTRAVENOUS | Status: AC
  Filled 2014-07-21: qty 20

## 2014-07-21 MED ORDER — EPHEDRINE SULFATE 50 MG/ML IJ SOLN
INTRAMUSCULAR | Status: AC
  Filled 2014-07-21: qty 1

## 2014-07-21 MED ORDER — LACTATED RINGERS IV SOLN
INTRAVENOUS | Status: DC | PRN
  Administered 2014-07-21: 09:00:00 via INTRAVENOUS

## 2014-07-21 MED ORDER — FENTANYL CITRATE 0.05 MG/ML IJ SOLN
INTRAMUSCULAR | Status: AC
  Filled 2014-07-21: qty 2

## 2014-07-21 MED ORDER — FENTANYL CITRATE 0.05 MG/ML IJ SOLN
25.0000 ug | INTRAMUSCULAR | Status: AC
  Administered 2014-07-21 (×2): 25 ug via INTRAVENOUS

## 2014-07-21 MED ORDER — EPINEPHRINE HCL 1 MG/ML IJ SOLN
INTRAMUSCULAR | Status: AC
  Filled 2014-07-21: qty 1

## 2014-07-21 MED ORDER — LIDOCAINE HCL (PF) 1 % IJ SOLN
INTRAMUSCULAR | Status: AC
  Filled 2014-07-21: qty 5

## 2014-07-21 MED ORDER — LIDOCAINE HCL (CARDIAC) 10 MG/ML IV SOLN
INTRAVENOUS | Status: DC | PRN
  Administered 2014-07-21: 50 mg via INTRAVENOUS

## 2014-07-21 MED ORDER — FENTANYL CITRATE 0.05 MG/ML IJ SOLN
INTRAMUSCULAR | Status: DC | PRN
  Administered 2014-07-21 (×3): 25 ug via INTRAVENOUS

## 2014-07-21 MED ORDER — MIDAZOLAM HCL 2 MG/2ML IJ SOLN
1.0000 mg | INTRAMUSCULAR | Status: DC | PRN
  Administered 2014-07-21: 2 mg via INTRAVENOUS

## 2014-07-21 MED ORDER — MIDAZOLAM HCL 2 MG/2ML IJ SOLN
INTRAMUSCULAR | Status: AC
  Filled 2014-07-21: qty 2

## 2014-07-21 MED ORDER — PROPOFOL INFUSION 10 MG/ML OPTIME
INTRAVENOUS | Status: DC | PRN
  Administered 2014-07-21: 150 ug/kg/min via INTRAVENOUS

## 2014-07-21 MED ORDER — ONDANSETRON HCL 4 MG/2ML IJ SOLN
4.0000 mg | Freq: Once | INTRAMUSCULAR | Status: AC
  Administered 2014-07-21: 4 mg via INTRAVENOUS

## 2014-07-21 MED ORDER — LIDOCAINE VISCOUS 2 % MT SOLN
OROMUCOSAL | Status: AC
  Filled 2014-07-21: qty 15

## 2014-07-21 MED ORDER — SODIUM CHLORIDE 0.9 % IJ SOLN
INTRAMUSCULAR | Status: AC
  Filled 2014-07-21: qty 10

## 2014-07-21 MED ORDER — MIDAZOLAM HCL 5 MG/5ML IJ SOLN
INTRAMUSCULAR | Status: DC | PRN
  Administered 2014-07-21 (×2): 1 mg via INTRAVENOUS

## 2014-07-21 MED ORDER — LACTATED RINGERS IV SOLN
INTRAVENOUS | Status: DC
  Administered 2014-07-21: 09:00:00 via INTRAVENOUS

## 2014-07-21 SURGICAL SUPPLY — 19 items
BLOCK BITE 60FR ADLT L/F BLUE (MISCELLANEOUS) ×3 IMPLANT
ELECT REM PT RETURN 9FT ADLT (ELECTROSURGICAL)
ELECTRODE REM PT RTRN 9FT ADLT (ELECTROSURGICAL) IMPLANT
FLOOR PAD 36X40 (MISCELLANEOUS) ×3
FORCEPS BIOP RAD 4 LRG CAP 4 (CUTTING FORCEPS) ×3 IMPLANT
FORMALIN 10 PREFIL 20ML (MISCELLANEOUS) ×3 IMPLANT
KIT CLEAN ENDO COMPLIANCE (KITS) ×3 IMPLANT
MANIFOLD NEPTUNE II (INSTRUMENTS) ×3 IMPLANT
NEEDLE SCLEROTHERAPY 25GX240 (NEEDLE) IMPLANT
OVERTUBE ENDOCUFF GREEN (MISCELLANEOUS) IMPLANT
PAD FLOOR 36X40 (MISCELLANEOUS) ×1 IMPLANT
PROBE APC STR FIRE (PROBE) IMPLANT
PROBE INJECTION GOLD (MISCELLANEOUS)
PROBE INJECTION GOLD 7FR (MISCELLANEOUS) IMPLANT
SNARE SHORT THROW 13M SML OVAL (MISCELLANEOUS) IMPLANT
SYR 50ML LL SCALE MARK (SYRINGE) ×3 IMPLANT
SYR INFLATION 60ML (SYRINGE) IMPLANT
TUBING IRRIGATION ENDOGATOR (MISCELLANEOUS) ×3 IMPLANT
WATER STERILE IRR 1000ML POUR (IV SOLUTION) ×3 IMPLANT

## 2014-07-21 NOTE — H&P (Signed)
Primary Care Physician:  Collene Mares, PA-C Primary Gastroenterologist:  Dr. Oneida Alar  Pre-Procedure History & Physical: HPI:  Tiffany Hale is a 59 y.o. female here for SCREENING: VARICES.  Past Medical History  Diagnosis Date  . Depression     problems concentrating, [facility] ([place])  . Asthma   . Tobacco abuse   . BMI (body mass index) 20.0-29.9 2010 155 LBS  . Chronic back pain   . Arthritis   . HCV (hepatitis C virus) 2009    COMPLETED RX 2015-OCT 2015 VL UNDETECTABLE  . HBV (hepatitis B virus) infection 2009: HBsAb POS  . Hepatitis A 2009 Ab POS  . Cirrhosis NOV 2011    MRI LIVER-5 SML DYSPLASTIC NODULES; 2009: TBILI 0.7 ALK PHOS 128 AST 56 ALT 53 ALB 3.8 INR 1.3    Past Surgical History  Procedure Laterality Date  . Extremity surgery      and Pelvic, after fall  . Esophagogastroduodenoscopy  MAR 2011    Grade 1 varices, GASTRITIS  . Total abdominal hysterectomy  1995 DUB  . Colonoscopy  OCT 2011     6 MM SIMPLE ADENOMA, BENIGN COLONIC MUCOSA  . Foot surgery Right 2009    [facility]-crushed foot with fall and it was repaired  . Esophagogastroduodenoscopy (egd) with propofol N/A 07/09/2012    SLF:The mucosa of the esophagus appeared normal/Small hiatal hernia/MODERATE Non-erosive gastritis (inflammation) with hemorrhage was found in the gastric antrum; multiple biopsies (reactive gastropathy). No H.pylori. No varices.Next EGD 07/2014.    Prior to Admission medications   Medication Sig Start Date End Date Taking? Authorizing Provider  ARIPiprazole (ABILIFY) 15 MG tablet Take 15 mg by mouth daily.     Yes Historical Provider, MD  conjugated estrogens (PREMARIN) vaginal cream Use 1 gram a night 07/23/13  Yes Florian Buff, MD  escitalopram (LEXAPRO) 20 MG tablet Take 30 mg by mouth daily.    Yes Historical Provider, MD  methylphenidate (CONCERTA) 36 MG CR tablet Take 36 mg by mouth every morning.     Yes Historical Provider, MD  omeprazole (PRILOSEC) 20 MG capsule 1 PO EVERY  MORNING Patient taking differently: Take 20 mg by mouth daily. 1 PO EVERY MORNING 04/01/14  Yes Danie Binder, MD  oxycodone (OXY-IR) 5 MG capsule Take 5 mg by mouth every 4 (four) hours as needed.   Yes Historical Provider, MD  traZODone (DESYREL) 150 MG tablet Take 150 mg by mouth at bedtime.    Yes Historical Provider, MD  zolpidem (AMBIEN) 10 MG tablet Take 10 mg by mouth at bedtime.   Yes Historical Provider, MD  estradiol (ESTRACE) 0.1 MG/GM vaginal cream Place 8.32 Applicatorfuls vaginally daily. Use every other night Patient not taking: Reported on 07/01/2014 11/28/12   Florian Buff, MD  nadolol (CORGARD) 20 MG tablet TAKE 1 TABLET BY MOUTH ONCE DAILY. Patient not taking: Reported on 04/01/2014    Orvil Feil, NP    Allergies as of 07/01/2014  . (No Known Allergies)    Family History  Problem Relation Age of Onset  . Colon cancer Neg Hx   . Colon polyps Neg Hx   . Birth defects Brother   . Cancer Maternal Grandmother   . Cancer Maternal Grandfather     History   Social History  . Marital Status: Divorced    Spouse Name: N/A  . Number of Children: N/A  . Years of Education: N/A   Occupational History  . Not on file.   Social  History Main Topics  . Smoking status: Former Smoker -- 0.50 packs/day for 40 years    Types: Cigarettes    Quit date: 05/25/2014  . Smokeless tobacco: Not on file     Comment: 3 days to go through a pack   . Alcohol Use: No     Comment: history of ETOH abuse, no alcohol X 10 years   . Drug Use: No  . Sexual Activity: Not Currently   Other Topics Concern  . Not on file   Social History Narrative    Review of Systems: See HPI, otherwise negative ROS   Physical Exam: BP 165/70 mmHg  Resp 21  SpO2 97% General:   Alert,  pleasant and cooperative in NAD Head:  Normocephalic and atraumatic. Neck:  Supple; Lungs:  Clear throughout to auscultation.    Heart:  Regular rate and rhythm. Abdomen:  Soft, nontender and nondistended.  Normal bowel sounds, without guarding, and without rebound.   Neurologic:  Alert and  oriented x4;  grossly normal neurologically.  Impression/Plan:     SCREENING VARICES  PLAN:  1.EGD TODAY

## 2014-07-21 NOTE — Anesthesia Procedure Notes (Signed)
Procedure Name: MAC Date/Time: 07/21/2014 9:32 AM Performed by: Andree Elk, AMY A Pre-anesthesia Checklist: Patient identified, Timeout performed, Emergency Drugs available, Suction available and Patient being monitored Oxygen Delivery Method: Nasal cannula

## 2014-07-21 NOTE — Anesthesia Postprocedure Evaluation (Signed)
  Anesthesia Post-op Note  Patient: Tiffany Hale  Procedure(s) Performed: Procedure(s) with comments: ESOPHAGOGASTRODUODENOSCOPY (EGD) WITH PROPOFOL (N/A) - 1045-moved to 9:30 Candy called pt BIOPSY (N/A) - Gastric  Patient Location: PACU  Anesthesia Type:MAC  Level of Consciousness: awake, alert , oriented and patient cooperative  Airway and Oxygen Therapy: Patient Spontanous Breathing  Post-op Pain: none  Post-op Assessment: Post-op Vital signs reviewed, Patient's Cardiovascular Status Stable, Respiratory Function Stable, Patent Airway, No signs of Nausea or vomiting and Pain level controlled  Post-op Vital Signs: Reviewed and stable  Last Vitals:  Filed Vitals:   07/21/14 0925  BP: 174/72  Resp: 13    Complications: No apparent anesthesia complications

## 2014-07-21 NOTE — Transfer of Care (Signed)
Immediate Anesthesia Transfer of Care Note  Patient: Tiffany Hale  Procedure(s) Performed: Procedure(s) with comments: ESOPHAGOGASTRODUODENOSCOPY (EGD) WITH PROPOFOL (N/A) - 1045-moved to 9:30 Candy called pt BIOPSY (N/A) - Gastric  Patient Location: PACU  Anesthesia Type:MAC  Level of Consciousness: awake, alert , oriented and patient cooperative  Airway & Oxygen Therapy: Patient Spontanous Breathing and Patient connected to nasal cannula oxygen  Post-op Assessment: Report given to RN and Post -op Vital signs reviewed and stable  Post vital signs: Reviewed and stable  Last Vitals:  Filed Vitals:   07/21/14 0925  BP: 174/72  Resp: 13    Complications: No apparent anesthesia complications

## 2014-07-21 NOTE — Discharge Instructions (Signed)
You have gastritis. I biopsied your stomach. YOU DO NOT HAVE BULGING VEINS IN YOUR ESOPHAGUS.  YOUR ULTRASOUND SHOWED CIRRHOSIS AND GALLSTONES. YOUR LABS ARE NORMAL except your platelet count of 68, 000. IT IS DUE TO YOUR LIVER DISEASE.   DRINK WATER TO KEEP YOUR URINE LIGHT YELLOW.  FOLLOW A HIGH FIBER DIET.  AVOID ITEMS THAT CAUSE BLOATING & GAS.SEE INFO BELOW.  CONTINUE OMEPRAZOLE.  TAKE 30 MINUTES PRIOR TO YOUR FIRST MEAL.  YOUR BIOPSY RESULTS WILL BE AVAILABLE IN MY CHART AFTER APR 14 AND MY OFFICE WILL CONTACT YOU IN 10-14 DAYS WITH YOUR RESULTS.   FOLLOW UP IN JUN 2016.  UPPER ENDOSCOPY AFTER CARE Read the instructions outlined below and refer to this sheet in the next week. These discharge instructions provide you with general information on caring for yourself after you leave the hospital. While your treatment has been planned according to the most current medical practices available, unavoidable complications occasionally occur. If you have any problems or questions after discharge, call DR. Faven Watterson, [phone].  ACTIVITY  You may resume your regular activity, but move at a slower pace for the next 24 hours.   Take frequent rest periods for the next 24 hours.   Walking will help get rid of the air and reduce the bloated feeling in your belly (abdomen).   No driving for 24 hours (because of the medicine (anesthesia) used during the test).   You may shower.   Do not sign any important legal documents or operate any machinery for 24 hours (because of the anesthesia used during the test).    NUTRITION  Drink plenty of fluids.   You may resume your normal diet as instructed by your doctor.   Begin with a light meal and progress to your normal diet. Heavy or fried foods are harder to digest and may make you feel sick to your stomach (nauseated).   Avoid alcoholic beverages for 24 hours or as instructed.    MEDICATIONS  You may resume your normal  medications.   WHAT YOU CAN EXPECT TODAY  Some feelings of bloating in the abdomen.   Passage of more gas than usual.    IF YOU HAD A BIOPSY TAKEN DURING THE UPPER ENDOSCOPY:  Eat a soft diet IF YOU HAVE NAUSEA, BLOATING, ABDOMINAL PAIN, OR VOMITING.    FINDING OUT THE RESULTS OF YOUR TEST Not all test results are available during your visit. DR. Oneida Alar WILL CALL YOU WITHIN 14 DAYS OF YOUR PROCEDUE WITH YOUR RESULTS. Do not assume everything is normal if you have not heard from DR. Shadia Larose IN 14 DAYS, CALL HER OFFICE AT [phone].  SEEK IMMEDIATE MEDICAL ATTENTION AND CALL THE OFFICE: [phone] IF:  You have more than a spotting of blood in your stool.   Your belly is swollen (abdominal distention).   You are nauseated or vomiting.   You have a temperature over 101F.   You have abdominal pain or discomfort that is severe or gets worse throughout the day.   Gastritis  Gastritis is an inflammation (the body's way of reacting to injury and/or infection) of the stomach. It is often caused by bacterial (germ) infections. It can also be caused BY ASPIRIN, BC/GOODY POWDER'S, (IBUPROFEN) MOTRIN, OR ALEVE (NAPROXEN), chemicals (including alcohol), SPICY FOODS, and medications. This illness may be associated with generalized malaise (feeling tired, not well), UPPER ABDOMINAL STOMACH cramps, and fever. One common bacterial cause of gastritis is an organism known as H. Pylori. This can be  treated with antibiotics.   High-Fiber Diet A high-fiber diet changes your normal diet to include more whole grains, legumes, fruits, and vegetables. Changes in the diet involve replacing refined carbohydrates with unrefined foods. The calorie level of the diet is essentially unchanged. The Dietary Reference Intake (recommended amount) for adult males is 38 grams per day. For adult females, it is 25 grams per day. Pregnant and lactating women should consume 28 grams of fiber per day. Fiber is the  intact part of a plant that is not broken down during digestion. Functional fiber is fiber that has been isolated from the plant to provide a beneficial effect in the body. PURPOSE  Increase stool bulk.   Ease and regulate bowel movements.   Lower cholesterol.  INDICATIONS THAT YOU NEED MORE FIBER  Constipation and hemorrhoids.   Uncomplicated diverticulosis (intestine condition) and irritable bowel syndrome.   Weight management.   As a protective measure against hardening of the arteries (atherosclerosis), diabetes, and cancer.   GUIDELINES FOR INCREASING FIBER IN THE DIET  Start adding fiber to the diet slowly. A gradual increase of about 5 more grams (2 slices of whole-wheat bread, 2 servings of most fruits or vegetables, or 1 bowl of high-fiber cereal) per day is best. Too rapid an increase in fiber may result in constipation, flatulence, and bloating.   Drink enough water and fluids to keep your urine clear or pale yellow. Water, juice, or caffeine-free drinks are recommended. Not drinking enough fluid may cause constipation.   Eat a variety of high-fiber foods rather than one type of fiber.   Try to increase your intake of fiber through using high-fiber foods rather than fiber pills or supplements that contain small amounts of fiber.   The goal is to change the types of food eaten. Do not supplement your present diet with high-fiber foods, but replace foods in your present diet.  INCLUDE A VARIETY OF FIBER SOURCES  Replace refined and processed grains with whole grains, canned fruits with fresh fruits, and incorporate other fiber sources. White rice, white breads, and most bakery goods contain little or no fiber.   Brown whole-grain rice, buckwheat oats, and many fruits and vegetables are all good sources of fiber. These include: broccoli, Brussels sprouts, cabbage, cauliflower, beets, sweet potatoes, white potatoes (skin on), carrots, tomatoes, eggplant, squash, berries, fresh  fruits, and dried fruits.   Cereals appear to be the richest source of fiber. Cereal fiber is found in whole grains and bran. Bran is the fiber-rich outer coat of cereal grain, which is largely removed in refining. In whole-grain cereals, the bran remains. In breakfast cereals, the largest amount of fiber is found in those with "bran" in their names. The fiber content is sometimes indicated on the label.   You may need to include additional fruits and vegetables each day.   In baking, for 1 cup white flour, you may use the following substitutions:   1 cup whole-wheat flour minus 2 tablespoons.   1/2 cup white flour plus 1/2 cup whole-wheat flour.

## 2014-07-21 NOTE — Op Note (Signed)
[facility] [address] [place], [address]   ENDOSCOPY PROCEDURE REPORT  PATIENT: Tiffany, Hale  MR#: [id number] BIRTHDATE: Dec 14, 1955 , 59  yrs. old GENDER: female  ENDOSCOPIST: Danie Binder, MD REFERRED [id number] Mann, PA-C  PROCEDURE DATE: 07-27-14 PROCEDURE:   EGD w/ biopsy  INDICATIONS:screening for varices. MAR 2016: U/S-CIRRHOSIS/GALLSTONES. APR 2016: MELD Na 8, CHILD PUGH A(5) MEDICATIONS: Monitored anesthesia care TOPICAL ANESTHETIC:   Viscous Xylocaine ASA CLASS:  DESCRIPTION OF PROCEDURE:     Physical exam was performed.  Informed consent was obtained from the patient after explaining the benefits, risks, and alternatives to the procedure.  The patient was connected to the monitor and placed in the left lateral position.  Continuous oxygen was provided by nasal cannula and IV medicine administered through an indwelling cannula.  After administration of sedation, the patients esophagus was intubated and the     endoscope was advanced under direct visualization to the second portion of the duodenum.  The scope was removed slowly by carefully examining the color, texture, anatomy, and integrity of the mucosa on the way out.  The patient was recovered in endoscopy and discharged home in satisfactory condition.   ESOPHAGUS: The mucosa of the esophagus appeared normal.   STOMACH: SMALL AMOUNT OF FOOD IN BODY.  ADEQUATE VISUALIZATION ACHEIVED. Mild non-erosive gastritis (inflammation) was found in the gastric antrum.  Multiple biopsies were performed using cold forceps. DUODENUM: The duodenal mucosa showed no abnormalities in the bulb and 2nd part of the duodenum. COMPLICATIONS: There were no immediate complications.  ENDOSCOPIC IMPRESSION: 1.   NO ESOPHAGEAL, GASTRIC, OR DUODENAL VARICES SEEN. 2.   MILD Non-erosive gastritis  RECOMMENDATIONS: DRINK WATER TO KEEP YOUR URINE LIGHT YELLOW. FOLLOW A HIGH FIBER DIET. OMEPRAZOLE 30  MINUTES PRIOR TO YOUR FIRST MEAL. AWAIT BIOPSY. FOLLOW UP IN JUN 2016. NEXT EGD IN APR 2019.  REPEAT EXAM:  eSigned:  Danie Binder, MD July 27, 2014 10:28 AM   CPT CODES: ICD CODES:  The ICD and CPT codes recommended by this software are interpretations from the data that the clinical staff has captured with the software.  The verification of the translation of this report to the ICD and CPT codes and modifiers is the sole responsibility of the health care institution and practicing physician where this report was generated.  [employer] will not be held responsible for the validity of the ICD and CPT codes included on this report.  AMA assumes no liability for data contained or not contained herein. CPT is a Designer, television/film set of the [employer].

## 2014-07-21 NOTE — Anesthesia Preprocedure Evaluation (Signed)
Anesthesia Evaluation  Patient identified by MRN, date of birth, ID band Patient awake    Reviewed: Allergy & Precautions, H&P , NPO status , Patient's Chart, lab work & pertinent test results  Airway Mallampati: II  TM Distance: >3 FB     Dental  (+) Edentulous Upper, Edentulous Lower   Pulmonary former smoker,  breath sounds clear to auscultation        Cardiovascular negative cardio ROS  Rhythm:Regular Rate:Normal     Neuro/Psych PSYCHIATRIC DISORDERS Depression    GI/Hepatic (+) Cirrhosis -  Esophageal Varices    , Hepatitis -, A, C  Endo/Other    Renal/GU      Musculoskeletal  (+) Arthritis -,   Abdominal   Peds  Hematology   Anesthesia Other Findings   Reproductive/Obstetrics                             Anesthesia Physical Anesthesia Plan  ASA: III  Anesthesia Plan: MAC   Post-op Pain Management:    Induction: Intravenous  Airway Management Planned: Simple Face Mask  Additional Equipment:   Intra-op Plan:   Post-operative Plan:   Informed Consent: I have reviewed the patients History and Physical, chart, labs and discussed the procedure including the risks, benefits and alternatives for the proposed anesthesia with the patient or authorized representative who has indicated his/her understanding and acceptance.     Plan Discussed with:   Anesthesia Plan Comments:         Anesthesia Quick Evaluation

## 2014-07-22 ENCOUNTER — Encounter (HOSPITAL_COMMUNITY): Payer: Self-pay | Admitting: Gastroenterology

## 2014-07-24 ENCOUNTER — Encounter: Payer: Self-pay | Admitting: Obstetrics & Gynecology

## 2014-07-24 ENCOUNTER — Other Ambulatory Visit (HOSPITAL_COMMUNITY)
Admission: RE | Admit: 2014-07-24 | Discharge: 2014-07-24 | Disposition: A | Discharge status: Home / Self Care | Payer: Medicaid Other | Source: Ambulatory Visit | Attending: Obstetrics & Gynecology | Admitting: Obstetrics & Gynecology

## 2014-07-24 ENCOUNTER — Ambulatory Visit (INDEPENDENT_AMBULATORY_CARE_PROVIDER_SITE_OTHER): Payer: Medicaid Other | Admitting: Obstetrics & Gynecology

## 2014-07-24 VITALS — BP 150/80 | HR 72 | Ht 64.0 in | Wt 129.0 lb

## 2014-07-24 DIAGNOSIS — Z01419 Encounter for gynecological examination (general) (routine) without abnormal findings: Secondary | ICD-10-CM | POA: Diagnosis not present

## 2014-07-24 DIAGNOSIS — Z1151 Encounter for screening for human papillomavirus (HPV): Secondary | ICD-10-CM | POA: Insufficient documentation

## 2014-07-24 DIAGNOSIS — N842 Polyp of vagina: Secondary | ICD-10-CM

## 2014-07-24 DIAGNOSIS — Z0000 Encounter for general adult medical examination without abnormal findings: Secondary | ICD-10-CM | POA: Diagnosis not present

## 2014-07-24 DIAGNOSIS — Z1211 Encounter for screening for malignant neoplasm of colon: Secondary | ICD-10-CM

## 2014-07-24 DIAGNOSIS — Z1212 Encounter for screening for malignant neoplasm of rectum: Secondary | ICD-10-CM | POA: Diagnosis not present

## 2014-07-24 MED ORDER — ESTROGENS, CONJUGATED 0.625 MG/GM VA CREA: TOPICAL_CREAM | VAGINAL | Status: DC

## 2014-07-24 NOTE — Addendum Note (Signed)
Addended by: Doyne Keel on: 07/24/2014 09:56 AM   Modules accepted: Orders

## 2014-07-24 NOTE — Progress Notes (Signed)
Patient ID: Tiffany Hale, female   DOB: 1955-11-18, 59 y.o.   MRN: [id number] Subjective:     Tiffany Hale is a 59 y.o. female here for a routine exam.  No LMP recorded. Patient has had a hysterectomy. No obstetric history on file. Birth Control Method:  na Menstrual Calendar(currently): na  Current complaints: none.   Current acute medical issues:  depression   Recent Gynecologic History No LMP recorded. Patient has had a hysterectomy. Last Pap: na,   Last mammogram: last week,  normal  Past Medical History  Diagnosis Date  . Depression     problems concentrating, MHS ([facility])  . Asthma   . Tobacco abuse   . BMI (body mass index) 20.0-29.9 2010 155 LBS  . Chronic back pain   . Arthritis   . HCV (hepatitis C virus) 2009    COMPLETED RX 2015-OCT 2015 VL UNDETECTABLE  . HBV (hepatitis B virus) infection 2009: HBsAb POS  . Hepatitis A 2009 Ab POS  . Cirrhosis NOV 2011    MRI LIVER-5 SML DYSPLASTIC NODULES; 2009: TBILI 0.7 ALK PHOS 128 AST 56 ALT 53 ALB 3.8 INR 1.3    Past Surgical History  Procedure Laterality Date  . Extremity surgery      and Pelvic, after fall  . Esophagogastroduodenoscopy  MAR 2011    Grade 1 varices, GASTRITIS  . Total abdominal hysterectomy  1995 DUB  . Colonoscopy  OCT 2011     6 MM SIMPLE ADENOMA, BENIGN COLONIC MUCOSA  . Foot surgery Right 2009    [facility]-crushed foot with fall and it was repaired  . Esophagogastroduodenoscopy (egd) with propofol N/A 07/09/2012    SLF:The mucosa of the esophagus appeared normal/Small hiatal hernia/MODERATE Non-erosive gastritis (inflammation) with hemorrhage was found in the gastric antrum; multiple biopsies (reactive gastropathy). No H.pylori. No varices.Next EGD 07/2014.  Marland Kitchen Esophagogastroduodenoscopy (egd) with propofol N/A 07/21/2014    Procedure: ESOPHAGOGASTRODUODENOSCOPY (EGD) WITH PROPOFOL;  Surgeon: Danie Binder, MD;  Location: [facility];  Service: Endoscopy;  Laterality: N/A;  1045-moved to 9:30 Candy  called pt  . Esophageal biopsy N/A 07/21/2014    Procedure: BIOPSY;  Surgeon: Danie Binder, MD;  Location: [facility];  Service: Endoscopy;  Laterality: N/A;  Gastric    OB History    No data available      History   Social History  . Marital Status: Divorced    Spouse Name: N/A  . Number of Children: N/A  . Years of Education: N/A   Social History Main Topics  . Smoking status: Former Smoker -- 0.50 packs/day for 40 years    Types: Cigarettes    Quit date: 05/25/2014  . Smokeless tobacco: Not on file     Comment: 3 days to go through a pack   . Alcohol Use: No     Comment: history of ETOH abuse, no alcohol X 10 years   . Drug Use: No  . Sexual Activity: Not Currently   Other Topics Concern  . None   Social History Narrative    Family History  Problem Relation Age of Onset  . Colon cancer Neg Hx   . Colon polyps Neg Hx   . Birth defects Brother   . Cancer Maternal Grandmother   . Cancer Maternal Grandfather      Current outpatient prescriptions:  .  ARIPiprazole (ABILIFY) 15 MG tablet, Take 15 mg by mouth daily.  , Disp: , Rfl:  .  conjugated estrogens (PREMARIN) vaginal  cream, Use 1 gram a night, Disp: 30 g, Rfl: 12 .  escitalopram (LEXAPRO) 20 MG tablet, Take 30 mg by mouth daily. , Disp: , Rfl:  .  methylphenidate (CONCERTA) 36 MG CR tablet, Take 36 mg by mouth every morning.  , Disp: , Rfl:  .  omeprazole (PRILOSEC) 20 MG capsule, 1 PO EVERY MORNING (Patient taking differently: Take 20 mg by mouth daily. 1 PO EVERY MORNING), Disp: 30 capsule, Rfl: 11 .  oxycodone (OXY-IR) 5 MG capsule, Take 5 mg by mouth every 4 (four) hours as needed., Disp: , Rfl:  .  zolpidem (AMBIEN) 10 MG tablet, Take 10 mg by mouth at bedtime., Disp: , Rfl:  .  traZODone (DESYREL) 150 MG tablet, Take 150 mg by mouth at bedtime. , Disp: , Rfl:   Review of Systems  Review of Systems  Constitutional: Negative for fever, chills, weight loss, malaise/fatigue and diaphoresis.  HENT:  Negative for hearing loss, ear pain, nosebleeds, congestion, sore throat, neck pain, tinnitus and ear discharge.   Eyes: Negative for blurred vision, double vision, photophobia, pain, discharge and redness.  Respiratory: Negative for cough, hemoptysis, sputum production, shortness of breath, wheezing and stridor.   Cardiovascular: Negative for chest pain, palpitations, orthopnea, claudication, leg swelling and PND.  Gastrointestinal: negative for abdominal pain. Negative for heartburn, nausea, vomiting, diarrhea, constipation, blood in stool and melena.  Genitourinary: Negative for dysuria, urgency, frequency, hematuria and flank pain.  Musculoskeletal: Negative for myalgias, back pain, joint pain and falls.  Skin: Negative for itching and rash.  Neurological: Negative for dizziness, tingling, tremors, sensory change, speech change, focal weakness, seizures, loss of consciousness, weakness and headaches.  Endo/Heme/Allergies: Negative for environmental allergies and polydipsia. Does not bruise/bleed easily.  Psychiatric/Behavioral: Negative for depression, suicidal ideas, hallucinations, memory loss and substance abuse. The patient is not nervous/anxious and does not have insomnia.        Objective:  Blood pressure 150/80, pulse 72, height 5' 4"  (1.626 m), weight 129 lb (58.514 kg).   Physical Exam  Vitals reviewed. Constitutional: She is oriented to person, place, and time. She appears well-developed and well-nourished.  HENT:  Head: Normocephalic and atraumatic.        Right Ear: External ear normal.  Left Ear: External ear normal.  Nose: Nose normal.  Mouth/Throat: Oropharynx is clear and moist.  Eyes: Conjunctivae and EOM are normal. Pupils are equal, round, and reactive to light. Right eye exhibits no discharge. Left eye exhibits no discharge. No scleral icterus.  Neck: Normal range of motion. Neck supple. No tracheal deviation present. No thyromegaly present.  Cardiovascular: Normal  rate, regular rhythm, normal heart sounds and intact distal pulses.  Exam reveals no gallop and no friction rub.   No murmur heard. Respiratory: Effort normal and breath sounds normal. No respiratory distress. She has no wheezes. She has no rales. She exhibits no tenderness.  GI: Soft. Bowel sounds are normal. She exhibits no distension and no mass. There is no tenderness. There is no rebound and no guarding.  Genitourinary:  Breasts no masses skin changes or nipple changes bilaterally      Vulva is normal without lesions Vagina is pink moist without discharge, + polyp which is new, vaginal cytology performed to ensure no cellular changes Cervix absent Uterus is absent Adnexa is negative  {Rectal    hemoccult negative, normal tone, no masses  Musculoskeletal: Normal range of motion. She exhibits no edema and no tenderness.  Neurological: She is alert and oriented to  person, place, and time. She has normal reflexes. She displays normal reflexes. No cranial nerve deficit. She exhibits normal muscle tone. Coordination normal.  Skin: Skin is warm and dry. No rash noted. No erythema. No pallor.  Psychiatric: She has a normal mood and affect. Her behavior is normal. Judgment and thought content normal.       Assessment:    Healthy female exam.    Plan:    Follow up in: 1 year. continue premarin cream

## 2014-07-27 LAB — CYTOLOGY - PAP

## 2014-07-29 ENCOUNTER — Telehealth: Payer: Self-pay | Admitting: Gastroenterology

## 2014-07-29 NOTE — Telephone Encounter (Signed)
Pt is aware of results. 

## 2014-07-29 NOTE — Telephone Encounter (Signed)
Please call pt. HER stomach Bx shows gastritis.    DRINK WATER TO KEEP YOUR URINE LIGHT YELLOW.  FOLLOW A HIGH FIBER DIET.  AVOID ITEMS THAT CAUSE BLOATING & GAS.  CONTINUE OMEPRAZOLE.  TAKE 30 MINUTES PRIOR TO YOUR FIRST MEAL.  FOLLOW UP IN JUN 2016 E30 CIRRHOSIS/GASTRITIS.  NEXT EGD 2019.

## 2014-07-29 NOTE — Telephone Encounter (Signed)
ON RECALL  °

## 2014-08-25 ENCOUNTER — Telehealth: Payer: Self-pay | Admitting: Cardiovascular Disease

## 2014-08-25 NOTE — Telephone Encounter (Signed)
Received records from [facility] for appointment with Dr Gwenlyn Found on 09/09/14.  Records given to [facility] (medical records) for Dr Kennon Holter schedule on 09/09/14. lp

## 2014-09-09 ENCOUNTER — Encounter: Payer: Self-pay | Admitting: Cardiovascular Disease

## 2014-09-09 ENCOUNTER — Ambulatory Visit (INDEPENDENT_AMBULATORY_CARE_PROVIDER_SITE_OTHER): Payer: Medicaid Other | Admitting: Cardiovascular Disease

## 2014-09-09 VITALS — BP 150/78 | HR 64 | Ht 64.0 in | Wt 128.0 lb

## 2014-09-09 DIAGNOSIS — M79672 Pain in left foot: Secondary | ICD-10-CM | POA: Diagnosis not present

## 2014-09-09 DIAGNOSIS — M79671 Pain in right foot: Secondary | ICD-10-CM

## 2014-09-09 NOTE — Assessment & Plan Note (Signed)
Tiffany Hale is referred by Dr. Barkley Br[facility], [facility], for peripheral vascular evaluation prior to right foot surgery. Her cardiac risk factor profile is notable for 50 pack years having stopped 6 months ago but is otherwise negative. She's never had a heart echo stroke and denies chest pain or shortness of breath or claudication. She had trauma to her feet several years ago and has significant foot pain when ambulating but denies claudication. She does have palpable pedal pulses. I'm going to get lower extremity arterial Doppler studies to assess her poor peripheral circulation prior to her upcoming podiatry procedures.

## 2014-09-09 NOTE — Patient Instructions (Signed)
Medication Instructions:   Continue your current medications  Labwork:  n/a  Testing/Procedures:  Lower extremity arterial doppler- During this test, ultrasound is used to evaluate arterial blood flow in the legs. Allow approximately one hour for this exam. I will contact you after the test to review the results with you.   Follow-Up:  As needed with Dr Gwenlyn Found.  Any Other Special Instructions Will Be Listed Below (If Applicable).

## 2014-09-09 NOTE — Progress Notes (Signed)
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 Her primary care provider is [facility] in [place]. She was referred by Dr. Barkley Bruns, her podiatrist, for peripheral vascular evaluation before elective right foot surgery. Her risk factors included 50-pack-years of tobacco abuse having quit 6 months ago but otherwise are negative. She's never had a heart attack or stroke and denies chest pain but is chronically short of breath probably from COPD. She had a trauma several years ago after falling from a significant height is bilateral foot pain on ambulation. Denies claudication. She was referred here for peripheral vascular evaluation.   Current Outpatient Prescriptions  Medication Sig Dispense Refill  . ARIPiprazole (ABILIFY) 15 MG tablet Take 15 mg by mouth daily.      Marland Kitchen conjugated estrogens (PREMARIN) vaginal cream Use 1 gram a night 30 g 12  . escitalopram (LEXAPRO) 20 MG tablet Take 30 mg by mouth daily.     . methylphenidate (CONCERTA) 36 MG CR tablet Take 36 mg by mouth every morning.      Marland Kitchen omeprazole (PRILOSEC) 20 MG capsule 1 PO EVERY MORNING (Patient taking differently: Take 20 mg by mouth daily. 1 PO EVERY MORNING) 30 capsule 11  . oxycodone (OXY-IR) 5 MG capsule Take 5 mg by mouth every 4 (four) hours as needed.    . traZODone (DESYREL) 150 MG tablet Take 150 mg by mouth at bedtime.     Marland Kitchen zolpidem (AMBIEN) 10 MG tablet Take 10 mg by mouth at bedtime.     No current facility-administered medications for this visit.    No Known Allergies  History   Social History  . Marital Status: Divorced    Spouse Name: N/A  . Number of Children: N/A  . Years of Education:  N/A   Occupational History  . Not on file.   Social History Main Topics  . Smoking status: Former Smoker -- 0.50 packs/day for 40 years    Types: Cigarettes    Quit date: 05/25/2014  . Smokeless tobacco: Not on file     Comment: 3 days to go through a pack   . Alcohol Use: No     Comment: history of ETOH abuse, no alcohol X 10 years   . Drug Use: No  . Sexual Activity: Not Currently   Other Topics Concern  . Not on file   Social History Narrative     Review of Systems: General: negative for chills, fever, night sweats or weight changes.  Cardiovascular: negative for chest pain, dyspnea on exertion, edema, orthopnea, palpitations, paroxysmal nocturnal dyspnea or shortness of breath Dermatological: negative for rash Respiratory: negative for cough or wheezing Urologic: negative for hematuria Abdominal: negative for nausea, vomiting, diarrhea, bright red blood per rectum, melena, or hematemesis Neurologic: negative for visual changes, syncope, or dizziness All other systems reviewed and are otherwise negative except as noted above.    Blood pressure 150/78, pulse 64, height 5\' 4"  (1.626 m), weight 128 lb (58.06 kg).  General appearance: alert and no distress Neck: no adenopathy, no carotid bruit, no JVD, supple, symmetrical, trachea midline and thyroid not enlarged, symmetric, no tenderness/mass/nodules Lungs: clear to auscultation bilaterally Heart: regular rate and rhythm, S1, S2 normal, no murmur,  click, rub or gallop Extremities: extremities normal, atraumatic, no cyanosis or edema  EKG not performed today  ASSESSMENT AND PLAN:   Bilateral foot pain Ms. Heckstall is referred by Dr. Barkley Bruns, [facility], for peripheral vascular evaluation prior to right foot surgery. Her cardiac risk factor profile is notable for 50 pack years having stopped 6 months ago but is otherwise negative. She's never had a heart echo stroke and denies chest pain or shortness of breath or  claudication. She had trauma to her feet several years ago and has significant foot pain when ambulating but denies claudication. She does have palpable pedal pulses. I'm going to get lower extremity arterial Doppler studies to assess her poor peripheral circulation prior to her upcoming podiatry procedures.       Lorretta Harp MD FACP,FACC,FAHA, [facility] 09/09/2014 11:54 AM

## 2014-09-21 ENCOUNTER — Ambulatory Visit: Payer: Medicaid Other | Admitting: Gastroenterology

## 2014-09-21 ENCOUNTER — Other Ambulatory Visit: Payer: Self-pay

## 2014-10-07 ENCOUNTER — Encounter: Payer: Self-pay | Admitting: Nurse Practitioner

## 2014-10-07 ENCOUNTER — Ambulatory Visit (INDEPENDENT_AMBULATORY_CARE_PROVIDER_SITE_OTHER): Payer: Medicaid Other | Admitting: Nurse Practitioner

## 2014-10-07 VITALS — BP 150/75 | HR 58 | Temp 97.9°F | Ht 64.0 in | Wt 127.0 lb

## 2014-10-07 DIAGNOSIS — B1920 Unspecified viral hepatitis C without hepatic coma: Secondary | ICD-10-CM

## 2014-10-07 DIAGNOSIS — K7469 Other cirrhosis of liver: Principal | ICD-10-CM

## 2014-10-07 DIAGNOSIS — B182 Chronic viral hepatitis C: Secondary | ICD-10-CM

## 2014-10-07 DIAGNOSIS — K5900 Constipation, unspecified: Secondary | ICD-10-CM

## 2014-10-07 DIAGNOSIS — K7460 Unspecified cirrhosis of liver: Secondary | ICD-10-CM | POA: Diagnosis not present

## 2014-10-07 DIAGNOSIS — K2970 Gastritis, unspecified, without bleeding: Secondary | ICD-10-CM | POA: Diagnosis not present

## 2014-10-07 LAB — HEPATIC FUNCTION PANEL
ALT: 13 U/L (ref 0–35)
AST: 19 U/L (ref 0–37)
Albumin: 4 g/dL (ref 3.5–5.2)
Alkaline Phosphatase: 68 U/L (ref 39–117)
BILIRUBIN DIRECT: 0.1 mg/dL (ref 0.0–0.3)
Indirect Bilirubin: 0.2 mg/dL (ref 0.2–1.2)
TOTAL PROTEIN: 7.8 g/dL (ref 6.0–8.3)
Total Bilirubin: 0.3 mg/dL (ref 0.2–1.2)

## 2014-10-07 LAB — BASIC METABOLIC PANEL
BUN: 18 mg/dL (ref 6–23)
CALCIUM: 9.8 mg/dL (ref 8.4–10.5)
CO2: 31 mEq/L (ref 19–32)
Chloride: 102 mEq/L (ref 96–112)
Creat: 0.79 mg/dL (ref 0.50–1.10)
Glucose, Bld: 88 mg/dL (ref 70–99)
Potassium: 4.4 mEq/L (ref 3.5–5.3)
SODIUM: 141 meq/L (ref 135–145)

## 2014-10-07 LAB — PROTIME-INR
INR: 1.21 (ref <1.50)
Prothrombin Time: 15.3 seconds — ABNORMAL HIGH (ref 11.6–15.2)

## 2014-10-07 LAB — VITAMIN B12: Vitamin B-12: 1193 pg/mL — ABNORMAL HIGH (ref 211–911)

## 2014-10-07 NOTE — Assessment & Plan Note (Signed)
Patient has well compensated cirrhosis. Last labs essentially normal except for thrombocytopenia with a platelet count of 92. Generally asymptomatic. She does have some chronic fatigue and weakness, however. Today we will order her routine labs including CBC, BMP, HSP, PTT/INR, AFP. We'll also order an abdominal ultrasound. We'll check vitamin D and vitamin B12 due to fatigue. Follow-up in 6 months for routine imaging and labs as well as check on her symptoms.

## 2014-10-07 NOTE — Assessment & Plan Note (Signed)
Status post EGD for variceal screening which found no varices however mild erosive gastritis. The patient was started on omeprazole 20 mg daily. Recommend she continue this. She has never had GERD symptoms. Return for follow-up in 6 months.

## 2014-10-07 NOTE — Progress Notes (Signed)
cc'd to pcp 

## 2014-10-07 NOTE — Patient Instructions (Addendum)
1. Have your labs drawn and ultrasound completed when you're able. 2. Continue taking the acid blocker medicine. 3. To help your incomplete emptying he can add Colace stool softener once a day and Benefiber 2 teaspoons once a day. 4. Return for follow-up in 6 months to update her labs and imaging as well as check on the status of your symptoms. Her symptoms worsen or have any changes feel free to call us before then to be worked then.

## 2014-10-07 NOTE — Assessment & Plan Note (Signed)
She complains of constipation. She generally has a bowel movement every 1-2 days, occasionally her stools are hard and she typically has incomplete emptying and asked to return shortly after her initial bowel movement. We will add Colace daily and Benefiber 2 teaspoons daily to promote more complete emptying. We can reassess her symptoms in 6 months or she returns for her routine cirrhosis follow-up

## 2014-10-07 NOTE — Progress Notes (Signed)
Referring Provider: [facility] Primary Care Physician:  [facility] Primary GI:  Dr. Oneida Alar  Chief Complaint  Patient presents with  . Follow-up    HPI:   76 one year female presents for postprocedure follow-up. Patient had an endoscopy 07/21/2014 with biopsy which is completed with propofol in the OR. Findings included no esophageal, gastric, duodenal varices noted, mild nonerosive gastritis. Recommend continue omeprazole 30 minutes prior to first meal, await biopsy, follow-up in June 2016, next EGD in April 2019. Stomach biopsy showed gastric body and antral type mucosa with associated minimal chronic inflammation. Patient does have a history of cirrhosis related to hep C which was subsequently treated with ribavirin/Viekira. HCV in October 2015 undetectable. Last labs completed at [facility] liver clinic showed, cytopenia with a platelet count of 92. Otherwise labs normal. Last imaging completed 07/08/2014 which found cirrhosis without mass and cholelithiasis without wall thickening or focal tenderness.  Today she states she's not sure if she has another appointment with the [facility] hepatitis clinic. Has a history of fatigue and would like her B12 checked with her routine labs today. Denies abdominal pain, N/V, hematochezia and melena. Does have some constipation, has a bowel movement every 1-2 days but has incomplete emptying but without straining. Also admits some weakness. Will get dizzy if she stands up too quickly. Bruises easily. Denies darkened urine. Deneis GERD symptoms. Denies chest pain, dyspnea, syncope, near syncope. Denies any other upper or lower GI symptoms.  Past Medical History  Diagnosis Date  . Depression     problems concentrating, [facility]HS ([facility])  . Asthma   . Tobacco abuse   . BMI (body mass index) 20.0-29.9 2010 155 LBS  . Chronic back pain   . Arthritis   . HCV (hepatitis C virus) 2009    COMPLETED RX 2015-OCT 2015 VL UNDETECTABLE  . HBV  (hepatitis B virus) infection 2009: HBsAb POS  . Hepatitis A 2009 Ab POS  . Cirrhosis NOV 2011    MRI LIVER-5 SML DYSPLASTIC NODULES; 2009: TBILI 0.7 ALK PHOS 128 AST 56 ALT 53 ALB 3.8 INR 1.3    Past Surgical History  Procedure Laterality Date  . Extremity surgery      and Pelvic, after fall  . Esophagogastroduodenoscopy  MAR 2011    Grade 1 varices, GASTRITIS  . Total abdominal hysterectomy  1995 DUB  . Colonoscopy  OCT 2011     6 MM SIMPLE ADENOMA, BENIGN COLONIC MUCOSA  . Foot surgery Right 2009    [facility]-crushed foot with fall and it was repaired  . Esophagogastroduodenoscopy (egd) with propofol N/A 07/09/2012    SLF:The mucosa of the esophagus appeared normal/Small hiatal hernia/MODERATE Non-erosive gastritis (inflammation) with hemorrhage was found in the gastric antrum; multiple biopsies (reactive gastropathy). No H.pylori. No varices.Next EGD 07/2014.  Marland Kitchen Esophagogastroduodenoscopy (egd) with propofol N/A 07/21/2014    SLF: 1. No esophageal , gastric, or duodanl varices seen. 2. Mild non-erosive gastritis.  . Esophageal biopsy N/A 07/21/2014    Procedure: BIOPSY;  Surgeon: Danie Binder, MD;  Location: [facility];  Service: Endoscopy;  Laterality: N/A;  Gastric    Current Outpatient Prescriptions  Medication Sig Dispense Refill  . ARIPiprazole (ABILIFY) 15 MG tablet Take 15 mg by mouth daily.      Marland Kitchen conjugated estrogens (PREMARIN) vaginal cream Use 1 gram a night 30 g 12  . escitalopram (LEXAPRO) 20 MG tablet Take 30 mg by mouth daily.     . methylphenidate (CONCERTA)  36 MG CR tablet Take 36 mg by mouth every morning.      Marland Kitchen omeprazole (PRILOSEC) 20 MG capsule 1 PO EVERY MORNING (Patient taking differently: Take 20 mg by mouth daily. 1 PO EVERY MORNING) 30 capsule 11  . oxycodone (OXY-IR) 5 MG capsule Take 5 mg by mouth every 4 (four) hours as needed.    . traZODone (DESYREL) 150 MG tablet Take 150 mg by mouth at bedtime.     Marland Kitchen zolpidem (AMBIEN) 10 MG tablet Take 10 mg by mouth at  bedtime.     No current facility-administered medications for this visit.    Allergies as of 10/07/2014  . (No Known Allergies)    Family History  Problem Relation Age of Onset  . Colon cancer Neg Hx   . Colon polyps Neg Hx   . Birth defects Brother   . Cancer Maternal Grandmother   . Cancer Maternal Grandfather     History   Social History  . Marital Status: Divorced    Spouse Name: N/A  . Number of Children: N/A  . Years of Education: N/A   Social History Main Topics  . Smoking status: Former Smoker -- 0.50 packs/day for 40 years    Types: Cigarettes    Quit date: 05/25/2014  . Smokeless tobacco: Not on file     Comment: 3 days to go through a pack   . Alcohol Use: No     Comment: history of ETOH abuse, no alcohol X 10 years   . Drug Use: No  . Sexual Activity: Not Currently   Other Topics Concern  . None   Social History Narrative    Review of Systems: 10 point ROS negative except as per HPI.   Physical Exam: BP 150/75 mmHg  Pulse 58  Temp(Src) 97.9 F (36.6 C) (Oral)  Ht _0  (1.626 m)  Wt 127 lb (57.607 kg)  BMI 21.79 kg/m2 General:   Alert and oriented. Pleasant and cooperative. Well-nourished and well-developed.  Head:  Normocephalic and atraumatic. Eyes:  Without icterus, sclera clear and conjunctiva pink.  Ears:  Normal auditory acuity. Cardiovascular:  S1, S2 present without murmurs appreciated. Normal pulses noted. Extremities without clubbing or edema. Respiratory:  Clear to auscultation bilaterally. No wheezes, rales, or rhonchi. No distress.  Gastrointestinal:  +BS, soft, non-tender and non-distended. No splenomegaly noted. No guarding or rebound. No masses appreciated. Liver slightly enlarged and appreciated at 1-2 fingerbreadths below the costal margin, however the patient is thin and small frame. Rectal:  Deferred  Skin:  Intact without significant lesions or rashes. Neurologic:  Alert and oriented x4;  grossly normal  neurologically. Psych:  Alert and cooperative. Normal mood and affect. Heme/Lymph/Immune: No excessive bruising noted.    10/07/2014 8:51 AM

## 2014-10-08 ENCOUNTER — Encounter (HOSPITAL_COMMUNITY): Payer: Medicaid Other

## 2014-10-08 LAB — CBC WITH DIFFERENTIAL/PLATELET
Basophils Absolute: 0 10*3/uL (ref 0.0–0.1)
Basophils Relative: 0 % (ref 0–1)
EOS PCT: 1 % (ref 0–5)
Eosinophils Absolute: 0 10*3/uL (ref 0.0–0.7)
HCT: 38.6 % (ref 36.0–46.0)
Hemoglobin: 13.3 g/dL (ref 12.0–15.0)
Lymphocytes Relative: 27 % (ref 12–46)
Lymphs Abs: 1.1 10*3/uL (ref 0.7–4.0)
MCH: 31.4 pg (ref 26.0–34.0)
MCHC: 34.5 g/dL (ref 30.0–36.0)
MCV: 91.3 fL (ref 78.0–100.0)
MONO ABS: 0.2 10*3/uL (ref 0.1–1.0)
MPV: 10.7 fL (ref 8.6–12.4)
Monocytes Relative: 6 % (ref 3–12)
Neutro Abs: 2.7 10*3/uL (ref 1.7–7.7)
Neutrophils Relative %: 66 % (ref 43–77)
Platelets: 87 10*3/uL — ABNORMAL LOW (ref 150–400)
RBC: 4.23 MIL/uL (ref 3.87–5.11)
RDW: 13.5 % (ref 11.5–15.5)
WBC: 4.1 10*3/uL (ref 4.0–10.5)

## 2014-10-08 LAB — VITAMIN D 25 HYDROXY (VIT D DEFICIENCY, FRACTURES): Vit D, 25-Hydroxy: 61 ng/mL (ref 30–100)

## 2014-10-08 LAB — AFP TUMOR MARKER: AFP-Tumor Marker: 2.4 ng/mL (ref <6.1)

## 2014-10-09 ENCOUNTER — Ambulatory Visit (HOSPITAL_COMMUNITY)
Admission: RE | Admit: 2014-10-09 | Discharge: 2014-10-09 | Disposition: A | Discharge status: Home / Self Care | Payer: Medicaid Other | Source: Ambulatory Visit | Attending: Cardiology | Admitting: Cardiology

## 2014-10-09 DIAGNOSIS — M79671 Pain in right foot: Secondary | ICD-10-CM | POA: Insufficient documentation

## 2014-10-09 DIAGNOSIS — M79672 Pain in left foot: Secondary | ICD-10-CM | POA: Diagnosis not present

## 2014-10-13 ENCOUNTER — Ambulatory Visit (HOSPITAL_COMMUNITY)
Admission: RE | Admit: 2014-10-13 | Discharge: 2014-10-13 | Disposition: A | Discharge status: Home / Self Care | Payer: Medicaid Other | Source: Ambulatory Visit | Attending: Nurse Practitioner | Admitting: Nurse Practitioner

## 2014-10-13 DIAGNOSIS — K7469 Other cirrhosis of liver: Secondary | ICD-10-CM

## 2014-10-13 DIAGNOSIS — B1920 Unspecified viral hepatitis C without hepatic coma: Secondary | ICD-10-CM

## 2014-10-13 DIAGNOSIS — K7460 Unspecified cirrhosis of liver: Secondary | ICD-10-CM | POA: Diagnosis present

## 2014-10-13 DIAGNOSIS — K8020 Calculus of gallbladder without cholecystitis without obstruction: Secondary | ICD-10-CM | POA: Diagnosis not present

## 2014-10-19 ENCOUNTER — Ambulatory Visit (INDEPENDENT_AMBULATORY_CARE_PROVIDER_SITE_OTHER): Payer: Medicaid Other | Admitting: Podiatry

## 2014-10-19 ENCOUNTER — Encounter: Payer: Self-pay | Admitting: Podiatry

## 2014-10-19 VITALS — BP 145/75 | HR 57 | Resp 15

## 2014-10-19 DIAGNOSIS — M2010 Hallux valgus (acquired), unspecified foot: Secondary | ICD-10-CM

## 2014-10-19 DIAGNOSIS — L84 Corns and callosities: Secondary | ICD-10-CM

## 2014-10-19 NOTE — Progress Notes (Signed)
   Subjective:    Patient ID: Tiffany Hale, female    DOB: [birth year], 59 y.o.   MRN: [id number]  HPI Pt presents with painful right great toe callus, she does also present with b/l bunions and hammertoe deformities   Review of Systems  Respiratory: Positive for shortness of breath.   All other systems reviewed and are negative.      Objective:   Physical Exam        Assessment & Plan:

## 2014-10-20 NOTE — Progress Notes (Signed)
Subjective:     Patient ID: Tiffany Hale, female   DOB: April 30, 1955, 59 y.o.   MRN: [id number]  HPI patient presents with structural deformities of both feet with a very painful lesion on the distal aspect of the right hallux that's making it hard to walk. States that it's been going on for a while and becoming increasingly tender   Review of Systems  All other systems reviewed and are negative.      Objective:   Physical Exam  Constitutional: She is oriented to person, place, and time.  Cardiovascular: Intact distal pulses.   Musculoskeletal: Normal range of motion.  Neurological: She is oriented to person, place, and time.  Skin: Skin is warm and dry.  Nursing note and vitals reviewed.  neurovascular status intact muscle strength adequate range of motion within normal limits with structural deformity of the forefoot right over left with digital deformity right over left hallux with rigid contracture noted and keratotic lesion on the distal tip of the hallux. I noted digital perfusion to be within normal limits with patient being well oriented 3 having thin skin secondary to her health     Assessment:     Structural deformity the forefoot leading to abnormal pressure points creating lesion formation and pain    Plan:     Reviewed conditions and at this time debrided distal lesion with padding and desperately 1 avoid surgery based on x-ray report. Patient will be seen back on an as-needed basis

## 2014-11-18 ENCOUNTER — Encounter: Payer: Self-pay | Admitting: Podiatry

## 2014-11-18 ENCOUNTER — Ambulatory Visit (INDEPENDENT_AMBULATORY_CARE_PROVIDER_SITE_OTHER): Payer: Medicaid Other | Admitting: Podiatry

## 2014-11-18 DIAGNOSIS — M2010 Hallux valgus (acquired), unspecified foot: Secondary | ICD-10-CM

## 2014-11-18 DIAGNOSIS — M2040 Other hammer toe(s) (acquired), unspecified foot: Secondary | ICD-10-CM | POA: Diagnosis not present

## 2014-11-18 NOTE — Progress Notes (Signed)
Subjective:     Patient ID: Tiffany Hale, female   DOB: Dec 06, 1955, 59 y.o.   MRN: [id number]  HPI patient states this toe has started to hurt me again   Review of Systems     Objective:   Physical Exam Neurovascular status unchanged no change in health history with a significant structural deformity of both forefeet with the hallux right been especially plantarflexed and laterally rotated    Assessment:     Structural deformity of the right big toe    Plan:     Reviewed considerations long-term for fusion of the right hallux and at this time we're going to try to treat conservatively with debridement and padding with considerations for fusion if symptoms persist

## 2015-04-07 ENCOUNTER — Ambulatory Visit: Payer: Medicaid Other | Admitting: Podiatry

## 2015-04-08 ENCOUNTER — Ambulatory Visit: Payer: Medicaid Other | Admitting: Gastroenterology

## 2015-04-14 ENCOUNTER — Ambulatory Visit (INDEPENDENT_AMBULATORY_CARE_PROVIDER_SITE_OTHER): Payer: Medicaid Other | Admitting: Podiatry

## 2015-04-14 ENCOUNTER — Encounter: Payer: Self-pay | Admitting: Podiatry

## 2015-04-14 DIAGNOSIS — L84 Corns and callosities: Secondary | ICD-10-CM | POA: Diagnosis not present

## 2015-04-19 NOTE — Progress Notes (Signed)
Subjective:     Patient ID: Tiffany Hale, female   DOB: 10-06-1955, 60 y.o.   MRN: [id number]  HPI patient presents stating that she has lesions on both feet that are painful   Review of Systems     Objective:   Physical Exam Neurovascular status unchanged with patient in poor health with thick keratotic lesion formations    Assessment:     Lesion secondary to pressure and to significant structural malalignment    Plan:     Discussed malalignment issue and today debrided nailbeds and lesions on both feet with no iatrogenic bleeding noted

## 2015-05-03 ENCOUNTER — Ambulatory Visit: Payer: Medicaid Other | Admitting: Nurse Practitioner

## 2015-05-11 ENCOUNTER — Other Ambulatory Visit: Payer: Self-pay | Admitting: Gastroenterology

## 2015-05-12 ENCOUNTER — Encounter: Payer: Self-pay | Admitting: Nurse Practitioner

## 2015-05-12 ENCOUNTER — Ambulatory Visit (INDEPENDENT_AMBULATORY_CARE_PROVIDER_SITE_OTHER): Payer: Medicaid Other | Admitting: Nurse Practitioner

## 2015-05-12 VITALS — BP 151/78 | HR 73 | Temp 98.4°F | Ht 64.0 in | Wt 134.0 lb

## 2015-05-12 DIAGNOSIS — B1920 Unspecified viral hepatitis C without hepatic coma: Secondary | ICD-10-CM | POA: Diagnosis not present

## 2015-05-12 DIAGNOSIS — K7469 Other cirrhosis of liver: Secondary | ICD-10-CM | POA: Diagnosis not present

## 2015-05-12 DIAGNOSIS — K589 Irritable bowel syndrome without diarrhea: Secondary | ICD-10-CM | POA: Diagnosis not present

## 2015-05-12 MED ORDER — DICYCLOMINE HCL 10 MG PO CAPS: 10.0000 mg | ORAL_CAPSULE | Freq: Three times a day (TID) | ORAL | Status: DC

## 2015-05-12 NOTE — Progress Notes (Signed)
Referring Provider: [facility] Primary Care Physician:  Collene Mares, PA-C Primary GI:  Dr. Oneida Alar  Chief Complaint  Patient presents with  . Follow-up    HPI:   60 year old Tiffany Hale presents for follow-up on compensated cirrhosis related to hepatitis C virus. Last seen in our office 10/07/2014 when her reviews labs are essentially normal except for platelet count of 92. Generally asymptomatic at that time from a liver standpoint except for some chronic fatigue and weakness. Her cirrhosis at that time as well compensated with a meld score of 9 and a child Pugh class A. Abdominal ultrasound completed 10/13/2014 showed sonographic findings consistent with hepatic cirrhosis without focal mass or lesion identified.  Today she states she's doing pretty good. Denies yellowing of skin/eyes, generalized pruritis, darkened urine, acute episodic confusion. She is having some stomach upset. Since having gastritis 6 months ago has "rough morning" with lower abdominal pain and several small bowel movements which are variable in consistency but usually mushy to loose. Pain improved after bowel movements. Drinks "a lot" of water every day, eats a fruit/veggie smoothie every day. Denies hematochezia, melena, fever, chills, unintentional weight loss. Has finished Hep C treatment last year in [place] with confirmed eradication. Denies chest pain, dyspnea, dizziness, lightheadedness, syncope, near syncope. Denies any other upper or lower GI symptoms.  Past Medical History  Diagnosis Date  . Depression     problems concentrating, MHS ([place])  . Asthma   . Tobacco abuse   . BMI (body mass index) 20.0-29.9 2010 155 LBS  . Chronic back pain   . Arthritis   . HCV (hepatitis C virus) 2009    COMPLETED RX 2015-OCT 2015 VL UNDETECTABLE  . HBV (hepatitis B virus) infection 2009: HBsAb POS  . Hepatitis A 2009 Ab POS  . Cirrhosis ([place]) NOV 2011    MRI LIVER-5 SML DYSPLASTIC NODULES; 2009:  TBILI 0.7 ALK PHOS 128 AST 56 ALT 53 ALB 3.8 INR 1.3    Past Surgical History  Procedure Laterality Date  . Extremity surgery      and Pelvic, after fall  . Esophagogastroduodenoscopy  MAR 2011    Grade 1 varices, GASTRITIS  . Total abdominal hysterectomy  1995 DUB  . Colonoscopy  OCT 2011     6 MM SIMPLE ADENOMA, BENIGN COLONIC MUCOSA  . Foot surgery Right 2009    [facility]-crushed foot with fall and it was repaired  . Esophagogastroduodenoscopy (egd) with propofol N/A 07/09/2012    SLF:The mucosa of the esophagus appeared normal/Small hiatal hernia/MODERATE Non-erosive gastritis (inflammation) with hemorrhage was found in the gastric antrum; multiple biopsies (reactive gastropathy). No H.pylori. No varices.Next EGD 07/2014.  Marland Kitchen Esophagogastroduodenoscopy (egd) with propofol N/A 07/21/2014    SLF: 1. No esophageal , gastric, or duodanl varices seen. 2. Mild non-erosive gastritis.  . Biopsy N/A 07/21/2014    Procedure: BIOPSY;  Surgeon: Danie Binder, MD;  Location: [facility];  Service: Endoscopy;  Laterality: N/A;  Gastric    Current Outpatient Prescriptions  Medication Sig Dispense Refill  . ARIPiprazole (ABILIFY) 15 MG tablet Take 15 mg by mouth daily.      Marland Kitchen conjugated estrogens (PREMARIN) vaginal cream Use 1 gram a night 30 g 12  . escitalopram (LEXAPRO) 20 MG tablet Take 30 mg by mouth daily.     . methylphenidate (CONCERTA) 36 MG CR tablet Take 36 mg by mouth every morning.      Marland Kitchen omeprazole (PRILOSEC) 20 MG capsule TAKE 1 CAPSULE BY MOUTH  EVERY MORNING 30 capsule 11  . oxycodone (OXY-IR) 5 MG capsule Take 5 mg by mouth every 4 (four) hours as needed.    . traZODone (DESYREL) 150 MG tablet Take 150 mg by mouth at bedtime.     Marland Kitchen zolpidem (AMBIEN) 10 MG tablet Take 10 mg by mouth at bedtime.     No current facility-administered medications for this visit.    Allergies as of 05/12/2015  . (No Known Allergies)    Family History  Problem Relation Age of Onset  . Colon cancer Neg Hx     . Colon polyps Neg Hx   . Birth defects Brother   . Cancer Maternal Grandmother   . Cancer Maternal Grandfather     Social History   Social History  . Marital Status: Divorced    Spouse Name: N/A  . Number of Children: N/A  . Years of Education: N/A   Social History Main Topics  . Smoking status: Former Smoker -- 0.50 packs/day for 40 years    Types: Cigarettes    Quit date: 05/25/2014  . Smokeless tobacco: None     Comment: 3 days to go through a pack   . Alcohol Use: No     Comment: history of ETOH abuse, no alcohol X 10 years   . Drug Use: No  . Sexual Activity: Not Currently   Other Topics Concern  . None   Social History Narrative    Review of Systems: 10-point ROS negative except as per HPI.   Physical Exam: BP 151/78 mmHg  Pulse 73  Temp(Src) 98.4 F (36.9 C)  Ht 5' 4"  (1.626 m)  Wt 134 lb (60.782 kg)  BMI 22.99 kg/m2 General:   Alert and oriented. Pleasant and cooperative. Well-nourished and well-developed.  Head:  Normocephalic and atraumatic. Eyes:  Without icterus, sclera clear and conjunctiva pink.  Ears:  Normal auditory acuity. Cardiovascular:  S1, S2 present without murmurs appreciated. Extremities without clubbing or edema. Respiratory:  Clear to auscultation bilaterally. No wheezes, rales, or rhonchi. No distress.  Gastrointestinal:  +BS, soft, non-tender and non-distended. No HSM noted. No guarding or rebound. No masses appreciated.  Rectal:  Deferred  Musculoskalatal:  Symmetrical without gross deformities. Skin:  Intact without significant lesions or rashes. Neurologic:  Alert and oriented x4;  grossly normal neurologically. Psych:  Alert and cooperative. Normal mood and affect. Heme/Lymph/Immune: No excessive bruising noted.    05/12/2015 10:46 AM

## 2015-05-12 NOTE — Patient Instructions (Signed)
1. Have your labs drawn when you're able to. 2. We will help you schedule your ultrasound. 3. I send in a prescription to your pharmacy for Bentyl to take 2 times a day and before bed as needed. I recommend taking it before bed, and again in the morning if you are still having symptoms began taking additional 2 times a day if symptoms persist. 4. Return for follow-up in 6 months for routine care. 5. Call us if you have any problems or worsening symptoms.

## 2015-05-12 NOTE — Progress Notes (Signed)
cc'ed to pcp °

## 2015-05-12 NOTE — Assessment & Plan Note (Signed)
Patient with compensated cirrhosis currently due for labs and imaging. She is generally asymptomatic from a liver standpoint. Today we will order CBC, CMP, PT/INR, AFP. We will also order a right upper quadrant ultrasound for hepatoma screening. Return for follow-up in 6 months, call with any problems before then.

## 2015-05-12 NOTE — Assessment & Plan Note (Signed)
She meets criteria for irritable bowel syndrome. Possibly makes presentation although she is currently diarrhea prominent with the majority of her stools being mushy to loose. She has multiple bowel movements, approximately 3-5 every morning with associated abdominal pain. Pain improves with bowel movement. This point I will start her on Bentyl for bed and 3 times a day as needed. Given that her symptoms are primarily in the morning, advised her to take Bentyl before sleep and it in the morning if she is still symptomatic can take another dose. Can also take 2 additional doses as needed for symptoms that persist during the day. Return for follow-up in 6 months to assess her symptom progression.

## 2015-05-12 NOTE — Progress Notes (Signed)
Calculations based on visit labs:  Meld: 9 Child-Pugh: A

## 2015-05-17 LAB — CBC WITH DIFFERENTIAL/PLATELET
BASOS ABS: 0 10*3/uL (ref 0.0–0.1)
Basophils Relative: 0 % (ref 0–1)
EOS ABS: 0 10*3/uL (ref 0.0–0.7)
Eosinophils Relative: 1 % (ref 0–5)
HCT: 35.2 % — ABNORMAL LOW (ref 36.0–46.0)
Hemoglobin: 12 g/dL (ref 12.0–15.0)
LYMPHS ABS: 0.9 10*3/uL (ref 0.7–4.0)
Lymphocytes Relative: 20 % (ref 12–46)
MCH: 29.6 pg (ref 26.0–34.0)
MCHC: 34.1 g/dL (ref 30.0–36.0)
MCV: 86.9 fL (ref 78.0–100.0)
MPV: 10.4 fL (ref 8.6–12.4)
Monocytes Absolute: 0.4 10*3/uL (ref 0.1–1.0)
Monocytes Relative: 9 % (ref 3–12)
NEUTROS PCT: 70 % (ref 43–77)
Neutro Abs: 3.2 10*3/uL (ref 1.7–7.7)
Platelets: 109 10*3/uL — ABNORMAL LOW (ref 150–400)
RBC: 4.05 MIL/uL (ref 3.87–5.11)
RDW: 13.7 % (ref 11.5–15.5)
WBC: 4.5 10*3/uL (ref 4.0–10.5)

## 2015-05-17 LAB — PROTIME-INR
INR: 1.19 (ref <1.50)
PROTHROMBIN TIME: 15.3 s — AB (ref 11.6–15.2)

## 2015-05-18 LAB — COMPREHENSIVE METABOLIC PANEL
ALT: 11 U/L (ref 6–29)
AST: 18 U/L (ref 10–35)
Albumin: 4.1 g/dL (ref 3.6–5.1)
Alkaline Phosphatase: 67 U/L (ref 33–130)
BUN: 12 mg/dL (ref 7–25)
CO2: 31 mmol/L (ref 20–31)
Calcium: 9.1 mg/dL (ref 8.6–10.4)
Chloride: 101 mmol/L (ref 98–110)
Creat: 0.59 mg/dL (ref 0.50–0.99)
Glucose, Bld: 97 mg/dL (ref 65–99)
Potassium: 4.5 mmol/L (ref 3.5–5.3)
Sodium: 136 mmol/L (ref 135–146)
Total Bilirubin: 0.4 mg/dL (ref 0.2–1.2)
Total Protein: 7.3 g/dL (ref 6.1–8.1)

## 2015-05-18 LAB — AFP TUMOR MARKER: AFP-Tumor Marker: 2.3 ng/mL (ref <6.1)

## 2015-05-20 NOTE — Progress Notes (Addendum)
Calculations based on visit labs:  MELD: 8 Child Pugh: A

## 2015-05-21 ENCOUNTER — Ambulatory Visit (HOSPITAL_COMMUNITY)
Admission: RE | Admit: 2015-05-21 | Discharge: 2015-05-21 | Disposition: A | Discharge status: Home / Self Care | Payer: Medicaid Other | Source: Ambulatory Visit | Attending: Physician Assistant | Admitting: Physician Assistant

## 2015-05-21 ENCOUNTER — Other Ambulatory Visit (HOSPITAL_COMMUNITY): Payer: Self-pay | Admitting: Physician Assistant

## 2015-05-21 ENCOUNTER — Ambulatory Visit (HOSPITAL_COMMUNITY)
Admission: RE | Admit: 2015-05-21 | Discharge: 2015-05-21 | Disposition: A | Discharge status: Home / Self Care | Payer: Medicaid Other | Source: Ambulatory Visit | Attending: Nurse Practitioner | Admitting: Nurse Practitioner

## 2015-05-21 DIAGNOSIS — B1920 Unspecified viral hepatitis C without hepatic coma: Secondary | ICD-10-CM | POA: Diagnosis present

## 2015-05-21 DIAGNOSIS — I7090 Unspecified atherosclerosis: Secondary | ICD-10-CM | POA: Diagnosis not present

## 2015-05-21 DIAGNOSIS — M533 Sacrococcygeal disorders, not elsewhere classified: Secondary | ICD-10-CM | POA: Insufficient documentation

## 2015-05-21 DIAGNOSIS — M8580 Other specified disorders of bone density and structure, unspecified site: Secondary | ICD-10-CM | POA: Insufficient documentation

## 2015-05-21 DIAGNOSIS — K7469 Other cirrhosis of liver: Secondary | ICD-10-CM | POA: Diagnosis present

## 2015-05-21 DIAGNOSIS — K8020 Calculus of gallbladder without cholecystitis without obstruction: Secondary | ICD-10-CM | POA: Insufficient documentation

## 2015-07-29 ENCOUNTER — Ambulatory Visit (INDEPENDENT_AMBULATORY_CARE_PROVIDER_SITE_OTHER): Payer: Medicaid Other | Admitting: Obstetrics & Gynecology

## 2015-07-29 ENCOUNTER — Encounter: Payer: Self-pay | Admitting: Obstetrics & Gynecology

## 2015-07-29 VITALS — BP 132/80 | HR 68 | Ht 64.0 in | Wt 126.0 lb

## 2015-07-29 DIAGNOSIS — Z0000 Encounter for general adult medical examination without abnormal findings: Secondary | ICD-10-CM

## 2015-07-29 DIAGNOSIS — Z1211 Encounter for screening for malignant neoplasm of colon: Secondary | ICD-10-CM

## 2015-07-29 DIAGNOSIS — Z01419 Encounter for gynecological examination (general) (routine) without abnormal findings: Secondary | ICD-10-CM

## 2015-07-29 DIAGNOSIS — Z1212 Encounter for screening for malignant neoplasm of rectum: Secondary | ICD-10-CM

## 2015-07-29 MED ORDER — ESTROGENS, CONJUGATED 0.625 MG/GM VA CREA: TOPICAL_CREAM | VAGINAL | Status: DC

## 2015-07-29 NOTE — Progress Notes (Signed)
Patient ID: Tiffany Hale, female   DOB: 05-15-1955, 60 y.o.   MRN: [id number] Subjective:     Tiffany Hale is a 60 y.o. female here for a routine exam.  No LMP recorded. Patient has had a hysterectomy. No obstetric history on file. Birth Control Method:  Hysterectomy  Menstrual Calendar(currently): hysterectomy   Current complaints: none.   Current acute medical issues:     Recent Gynecologic History No LMP recorded. Patient has had a hysterectomy. Last Pap: na,   Last mammogram: 07/2014,  normal  Past Medical History  Diagnosis Date  . Depression     problems concentrating, MHS ([place])  . Asthma   . Tobacco abuse   . BMI (body mass index) 20.0-29.9 2010 155 LBS  . Chronic back pain   . Arthritis   . HCV (hepatitis C virus) 2009    COMPLETED RX 2015-OCT 2015 VL UNDETECTABLE  . HBV (hepatitis B virus) infection 2009: HBsAb POS  . Hepatitis A 2009 Ab POS  . Cirrhosis ([place]) NOV 2011    MRI LIVER-5 SML DYSPLASTIC NODULES; 2009: TBILI 0.7 ALK PHOS 128 AST 56 ALT 53 ALB 3.8 INR 1.3    Past Surgical History  Procedure Laterality Date  . Extremity surgery      and Pelvic, after fall  . Esophagogastroduodenoscopy  MAR 2011    Grade 1 varices, GASTRITIS  . Total abdominal hysterectomy  1995 DUB  . Colonoscopy  OCT 2011     6 MM SIMPLE ADENOMA, BENIGN COLONIC MUCOSA  . Foot surgery Right 2009    [facility]-crushed foot with fall and it was repaired  . Esophagogastroduodenoscopy (egd) with propofol N/A 07/09/2012    SLF:The mucosa of the esophagus appeared normal/Small hiatal hernia/MODERATE Non-erosive gastritis (inflammation) with hemorrhage was found in the gastric antrum; multiple biopsies (reactive gastropathy). No H.pylori. No varices.Next EGD 07/2014.  Marland Kitchen Esophagogastroduodenoscopy (egd) with propofol N/A 07/21/2014    SLF: 1. No esophageal , gastric, or duodanl varices seen. 2. Mild non-erosive gastritis.  . Biopsy N/A 07/21/2014    Procedure: BIOPSY;  Surgeon: Danie Binder, MD;  Location: [facility];  Service: Endoscopy;  Laterality: N/A;  Gastric    OB History    No data available      Social History   Social History  . Marital Status: Divorced    Spouse Name: N/A  . Number of Children: N/A  . Years of Education: N/A   Social History Main Topics  . Smoking status: Former Smoker -- 0.50 packs/day for 40 years    Types: Cigarettes    Quit date: 05/25/2014  . Smokeless tobacco: Never Used     Comment: 3 days to go through a pack   . Alcohol Use: No     Comment: history of ETOH abuse, no alcohol X 10 years   . Drug Use: No  . Sexual Activity: Not Currently   Other Topics Concern  . None   Social History Narrative    Family History  Problem Relation Age of Onset  . Colon cancer Neg Hx   . Colon polyps Neg Hx   . Birth defects Brother   . Cancer Maternal Grandmother   . Cancer Maternal Grandfather      Current outpatient prescriptions:  .  ARIPiprazole (ABILIFY) 15 MG tablet, Take 15 mg by mouth daily.  , Disp: , Rfl:  .  conjugated estrogens (PREMARIN) vaginal cream, Use 1 gram a night, Disp: 30 g, Rfl: 12 .  escitalopram (LEXAPRO) 20 MG tablet, Take 30 mg by mouth daily. , Disp: , Rfl:  .  methylphenidate (CONCERTA) 36 MG CR tablet, Take 36 mg by mouth every morning.  , Disp: , Rfl:  .  omeprazole (PRILOSEC) 20 MG capsule, TAKE 1 CAPSULE BY MOUTH EVERY MORNING, Disp: 30 capsule, Rfl: 11 .  oxycodone (OXY-IR) 5 MG capsule, Take 5 mg by mouth every 4 (four) hours as needed., Disp: , Rfl:  .  traZODone (DESYREL) 150 MG tablet, Take 150 mg by mouth at bedtime. , Disp: , Rfl:  .  zolpidem (AMBIEN) 10 MG tablet, Take 10 mg by mouth at bedtime., Disp: , Rfl:   Review of Systems  Review of Systems  Constitutional: Negative for fever, chills, weight loss, malaise/fatigue and diaphoresis.  HENT: Negative for hearing loss, ear pain, nosebleeds, congestion, sore throat, neck pain, tinnitus and ear discharge.   Eyes: Negative for blurred  vision, double vision, photophobia, pain, discharge and redness.  Respiratory: Negative for cough, hemoptysis, sputum production, shortness of breath, wheezing and stridor.   Cardiovascular: Negative for chest pain, palpitations, orthopnea, claudication, leg swelling and PND.  Gastrointestinal: negative for abdominal pain. Negative for heartburn, nausea, vomiting, diarrhea, constipation, blood in stool and melena.  Genitourinary: Negative for dysuria, urgency, frequency, hematuria and flank pain.  Musculoskeletal: Negative for myalgias, back pain, joint pain and falls.  Skin: Negative for itching and rash.  Neurological: Negative for dizziness, tingling, tremors, sensory change, speech change, focal weakness, seizures, loss of consciousness, weakness and headaches.  Endo/Heme/Allergies: Negative for environmental allergies and polydipsia. Does not bruise/bleed easily.  Psychiatric/Behavioral: Negative for depression, suicidal ideas, hallucinations, memory loss and substance abuse. The patient is not nervous/anxious and does not have insomnia.        Objective:  Blood pressure 132/80, pulse 68, height 5' 4"  (1.626 m), weight 126 lb (57.153 kg).   Physical Exam  Vitals reviewed. Constitutional: She is oriented to person, place, and time. She appears well-developed and well-nourished.  HENT:  Head: Normocephalic and atraumatic.        Right Ear: External ear normal.  Left Ear: External ear normal.  Nose: Nose normal.  Mouth/Throat: Oropharynx is clear and moist.  Eyes: Conjunctivae and EOM are normal. Pupils are equal, round, and reactive to light. Right eye exhibits no discharge. Left eye exhibits no discharge. No scleral icterus.  Neck: Normal range of motion. Neck supple. No tracheal deviation present. No thyromegaly present.  Cardiovascular: Normal rate, regular rhythm, normal heart sounds and intact distal pulses.  Exam reveals no gallop and no friction rub.   No murmur  heard. Respiratory: Effort normal and breath sounds normal. No respiratory distress. She has no wheezes. She has no rales. She exhibits no tenderness.  GI: Soft. Bowel sounds are normal. She exhibits no distension and no mass. There is no tenderness. There is no rebound and no guarding.  Genitourinary:  Breasts no masses skin changes or nipple changes bilaterally      Vulva is normal without lesions Vagina is pink moist without discharge Cervix absent Uterus is absent Adnexa is negative {Rectal    hemoccult negative, normal tone, no masses  Musculoskeletal: Normal range of motion. She exhibits no edema and no tenderness.  Neurological: She is alert and oriented to person, place, and time. She has normal reflexes. She displays normal reflexes. No cranial nerve deficit. She exhibits normal muscle tone. Coordination normal.  Skin: Skin is warm and dry. No rash noted. No erythema. No pallor.  Psychiatric: She has a normal mood and affect. Her behavior is normal. Judgment and thought content normal.       Medications Ordered at today's visit: Meds ordered this encounter  Medications  . conjugated estrogens (PREMARIN) vaginal cream    Sig: Use 1 gram a night    Dispense:  30 g    Refill:  12    Other orders placed at today's visit: No orders of the defined types were placed in this encounter.      Assessment:    Healthy female exam.    Plan:    Mammogram ordered. Follow up in: 1 year.

## 2015-11-09 ENCOUNTER — Other Ambulatory Visit: Payer: Self-pay

## 2015-11-09 ENCOUNTER — Encounter: Payer: Self-pay | Admitting: Nurse Practitioner

## 2015-11-09 ENCOUNTER — Ambulatory Visit (INDEPENDENT_AMBULATORY_CARE_PROVIDER_SITE_OTHER): Payer: Medicaid Other | Admitting: Nurse Practitioner

## 2015-11-09 VITALS — BP 114/69 | HR 65 | Temp 98.4°F | Ht 64.0 in | Wt 115.2 lb

## 2015-11-09 DIAGNOSIS — B1920 Unspecified viral hepatitis C without hepatic coma: Secondary | ICD-10-CM

## 2015-11-09 DIAGNOSIS — R1310 Dysphagia, unspecified: Secondary | ICD-10-CM | POA: Insufficient documentation

## 2015-11-09 DIAGNOSIS — K7469 Other cirrhosis of liver: Secondary | ICD-10-CM | POA: Diagnosis not present

## 2015-11-09 NOTE — Assessment & Plan Note (Signed)
Patient complains of solid food and pill dysphagia. Dentures are not helping a problem, however they have not changed significantly recently and is unlikely to be the primary source. Recent endoscopy without noted stricture. She does have liver disease, no varices noted previous endoscopy. At this point we'll proceed with an upper endoscopy with possible dilation to further evaluate an attempt to ameliorate the problem. Return for follow-up in 6 months, or sooner if needed.  Proceed with EGD with propofol/MAC with Dr. Oneida Alar in near future: the risks, benefits, and alternatives have been discussed with the patient in detail. The patient states understanding and desires to proceed.  The patient is currently on oxycodone, denies alcohol and drug use. Also on Lexapro, Concerta, Ambien. Previous procedure completed on propofol and we will schedule this procedure in the same manner to promote adequate sedation.

## 2015-11-09 NOTE — Patient Instructions (Signed)
Tiffany Hale  11/09/2015     @PREFPERIOPPHARMACY @   Your procedure is scheduled on 11/16/15.  Report to [facility] at 6:45 A.M.  Call this number if you have problems the morning of surgery:  [phone]   Remember:  Do not eat food or drink liquids after midnight.  Take these medicines the morning of surgery with A SIP OF WATER Abilify, Premarin, Lexapro, Concerta, Prilosec, Oxy IR if needed   Do not wear jewelry, make-up or nail polish.  Do not wear lotions, powders, or perfumes.  You may wear deoderant.  Do not shave 48 hours prior to surgery.  Men may shave face and neck.  Do not bring valuables to the hospital.  [facility] is not responsible for any belongings or valuables.  Contacts, dentures or bridgework may not be worn into surgery.  Leave your suitcase in the car.  After surgery it may be brought to your room.  For patients admitted to the hospital, discharge time will be determined by your treatment team.  Patients discharged the day of surgery will not be allowed to drive home.    Please read over the following fact sheets that you were given. Anesthesia Post-op Instructions     PATIENT INSTRUCTIONS POST-ANESTHESIA  IMMEDIATELY FOLLOWING SURGERY:  Do not drive or operate machinery for the first twenty four hours after surgery.  Do not make any important decisions for twenty four hours after surgery or while taking narcotic pain medications or sedatives.  If you develop intractable nausea and vomiting or a severe headache please notify your doctor immediately.  FOLLOW-UP:  Please make an appointment with your surgeon as instructed. You do not need to follow up with anesthesia unless specifically instructed to do so.  WOUND CARE INSTRUCTIONS (if applicable):  Keep a dry clean dressing on the anesthesia/puncture wound site if there is drainage.  Once the wound has quit draining you may leave it open to air.  Generally you should leave the bandage intact for  twenty four hours unless there is drainage.  If the epidural site drains for more than 36-48 hours please call the anesthesia department.  QUESTIONS?:  Please feel free to call your physician or the hospital operator if you have any questions, and they will be happy to assist you.      Esophagogastroduodenoscopy Esophagogastroduodenoscopy (EGD) is a procedure that is used to examine the lining of the esophagus, stomach, and first part of the small intestine (duodenum). A long, flexible, lighted tube with a camera attached (endoscope) is inserted down the throat to view these organs. This procedure is done to detect problems or abnormalities, such as inflammation, bleeding, ulcers, or growths, in order to treat them. The procedure lasts 5-20 minutes. It is usually an outpatient procedure, but it may need to be performed in a hospital in emergency cases. LET [facility] CARE PROVIDER KNOW ABOUT:  Any allergies you have.  All medicines you are taking, including vitamins, herbs, eye drops, creams, and over-the-counter medicines.  Previous problems you or members of your family have had with the use of anesthetics.  Any blood disorders you have.  Previous surgeries you have had.  Medical conditions you have. RISKS AND COMPLICATIONS Generally, this is a safe procedure. However, problems can occur and include:  Infection.  Bleeding.  Tearing (perforation) of the esophagus, stomach, or duodenum.  Difficulty breathing or not being able to breathe.  Excessive sweating.  Spasms of the larynx.  Slowed heartbeat.  Low  blood pressure. BEFORE THE PROCEDURE  Do not eat or drink anything after midnight on the night before the procedure or as directed by your health care provider.  Do not take your regular medicines before the procedure if your health care provider asks you not to. Ask your health care provider about changing or stopping those medicines.  If you wear dentures, be prepared  to remove them before the procedure.  Arrange for someone to drive you home after the procedure. PROCEDURE  A numbing medicine (local anesthetic) may be sprayed in your throat for comfort and to stop you from gagging or coughing.  You will have an IV tube inserted in a vein in your hand or arm. You will receive medicines and fluids through this tube.  You will be given a medicine to relax you (sedative).  A pain reliever will be given through the IV tube.  A mouth guard may be placed in your mouth to protect your teeth and to keep you from biting on the endoscope.  You will be asked to lie on your left side.  The endoscope will be inserted down your throat and into your esophagus, stomach, and duodenum.  Air will be put through the endoscope to allow your health care provider to clearly view the lining of your esophagus.  The lining of your esophagus, stomach, and duodenum will be examined. During the exam, your health care provider may:  Remove tissue to be examined under a microscope (biopsy) for inflammation, infection, or other medical problems.  Remove growths.  Remove objects (foreign bodies) that are stuck.  Treat any bleeding with medicines or other devices that stop tissues from bleeding (hot cautery, clipping devices).  Widen (dilate) or stretch narrowed areas of your esophagus and stomach.  The endoscope will be withdrawn. AFTER THE PROCEDURE  You will be taken to a recovery area for observation. Your blood pressure, heart rate, breathing rate, and blood oxygen level will be monitored often until the medicines you were given have worn off.  Do not eat or drink anything until the numbing medicine has worn off and your gag reflex has returned. You may choke.  Your health care provider should be able to discuss his or her findings with you. It will take longer to discuss the test results if any biopsies were taken.   This information is not intended to replace advice  given to you by your health care provider. Make sure you discuss any questions you have with your health care provider.   Document Released: 07/28/2004 Document Revised: 04/17/2014 Document Reviewed: 02/28/2012 Elsevier Interactive Patient Education [employer].

## 2015-11-09 NOTE — Progress Notes (Addendum)
REVIEWED-NO ADDITIONAL RECOMMENDATIONS.  Referring Provider: Ginger Organ Primary Care Physician:  Collene Mares, PA-C Primary GI:  Dr. Oneida Alar  Chief Complaint  Patient presents with  . Cirrhosis  . Dysphagia    x 2 months.    HPI:   Tiffany Hale is a 60 y.o. 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 She was last seen in our office to 04/30/2015 and noted to meet criteria for irritable bowel syndrome diarrhea prominent. At that time she is having 3-5 bowel movements a day associated with abdominal pain which improved with bowel movement. She was started on Bentyl before meals and at bedtime as needed. Cirrhosis was deemed UNSAFE in. Follow-up labs and imaging were ordered. Based on those labs her MELD score was 8 and Child Pugh A. right upper quadrant ultrasound consistent with cirrhosis without focal lesions.  Today she states she's doing well overall. Denies yellowing of skin/eyes, abdominal pain, N/V, darkened urine, generalized pruritis, tremors, acute episodic confusion. While on Bentyl, was having fewer bowel movement and improvement in abdominal pain.  Main concern today is dysphagia. Denies GERD symptoms. Has previously had this problem before years prior, no previous EGD. Solid food dysphagia and pill dysphagia. "Gets stuck on the way down" with occasional regurgitation. Has dentures "which aren't helping cause I can't chew very well, but this is new the dentures haven't changed." Denies odynophagia.   Last EGD done 2016 on propofol/MAC: no varices, mild gastritis, no mention of stricture, web, or ring. Oxycodone for chronic back pain.  Past Medical History:  Diagnosis Date  . Arthritis   . Asthma   . BMI (body mass index) 20.0-29.9 2010 155 LBS  . Chronic back pain   . Cirrhosis ([place]) NOV 2011   MRI LIVER-5 SML DYSPLASTIC NODULES; 2009: TBILI 0.7 ALK PHOS 128 AST 56 ALT 53 ALB 3.8 INR 1.3  . Depression      problems concentrating, [facility] ([facility])  . HBV (hepatitis B virus) infection 2009: HBsAb POS  . HCV (hepatitis C virus) 2009   COMPLETED RX 2015-OCT 2015 VL UNDETECTABLE  . Hepatitis A 2009 Ab POS  . Tobacco abuse     Past Surgical History:  Procedure Laterality Date  . BIOPSY N/A 07/21/2014   Procedure: BIOPSY;  Surgeon: Danie Binder, MD;  Location: [facility];  Service: Endoscopy;  Laterality: N/A;  Gastric  . COLONOSCOPY  OCT 2011    6 MM SIMPLE ADENOMA, BENIGN COLONIC MUCOSA  . ESOPHAGOGASTRODUODENOSCOPY  MAR 2011   Grade 1 varices, GASTRITIS  . ESOPHAGOGASTRODUODENOSCOPY (EGD) WITH PROPOFOL N/A 07/09/2012   SLF:The mucosa of the esophagus appeared normal/Small hiatal hernia/MODERATE Non-erosive gastritis (inflammation) with hemorrhage was found in the gastric antrum; multiple biopsies (reactive gastropathy). No H.pylori. No varices.Next EGD 07/2014.  Marland Kitchen ESOPHAGOGASTRODUODENOSCOPY (EGD) WITH PROPOFOL N/A 07/21/2014   SLF: 1. No esophageal , gastric, or duodanl varices seen. 2. Mild non-erosive gastritis.  . Extremity Surgery     and Pelvic, after fall  . FOOT SURGERY Right 2009   [facility]-crushed foot with fall and it was repaired  . TOTAL ABDOMINAL HYSTERECTOMY  1995 DUB    Current Outpatient Prescriptions  Medication Sig Dispense Refill  . ARIPiprazole (ABILIFY) 15 MG tablet Take 15 mg by mouth daily.      Marland Kitchen conjugated estrogens (PREMARIN) vaginal cream Use 1 gram a night 30 g 12  . escitalopram (LEXAPRO) 20 MG tablet Take 30 mg by mouth daily.     Marland Kitchen  methylphenidate (CONCERTA) 36 MG CR tablet Take 36 mg by mouth every morning.      Marland Kitchen omeprazole (PRILOSEC) 20 MG capsule TAKE 1 CAPSULE BY MOUTH EVERY MORNING 30 capsule 11  . oxycodone (OXY-IR) 5 MG capsule Take 5 mg by mouth every 4 (four) hours as needed.    . traZODone (DESYREL) 150 MG tablet Take 150 mg by mouth at bedtime.     Marland Kitchen zolpidem (AMBIEN) 10 MG tablet Take 10 mg by mouth at bedtime.     No current facility-administered  medications for this visit.     Allergies as of 11/09/2015  . (No Known Allergies)    Family History  Problem Relation Age of Onset  . Colon cancer Neg Hx   . Colon polyps Neg Hx   . Birth defects Brother   . Cancer Maternal Grandmother   . Cancer Maternal Grandfather     Social History   Social History  . Marital status: Divorced    Spouse name: N/A  . Number of children: N/A  . Years of education: N/A   Social History Main Topics  . Smoking status: Former Smoker    Packs/day: 0.50    Years: 40.00    Types: Cigarettes    Quit date: 05/25/2014  . Smokeless tobacco: Never Used     Comment: 3 days to go through a pack   . Alcohol use No     Comment: history of ETOH abuse, no alcohol X 10 years   . Drug use: No  . Sexual activity: Not Currently   Other Topics Concern  . None   Social History Narrative  . None    Review of Systems: 10-point ROS negative except as per HPI.   Physical Exam: BP 114/69   Pulse 65   Temp 98.4 F (36.9 C) (Oral)   Ht _0  (1.626 m)   Wt 115 lb 3.2 oz (52.3 kg)   BMI 19.77 kg/m  General:   Alert and oriented. Pleasant and cooperative. Well-nourished and well-developed.  Eyes:  Without icterus, sclera clear and conjunctiva pink.  Ears:  Normal auditory acuity. Cardiovascular:  S1, S2 present without murmurs appreciated. Normal bilateral DP pulses noted. Extremities without clubbing or edema. Respiratory:  Clear to auscultation bilaterally. No wheezes, rales, or rhonchi. No distress.  Gastrointestinal:  +BS, soft, non-tender and non-distended. No HSM noted. No guarding or rebound. No masses appreciated.  Rectal:  Deferred  Musculoskalatal:  Symmetrical without gross deformities. Skin:  Intact without significant lesions or rashes. Neurologic:  Alert and oriented x4;  grossly normal neurologically. Psych:  Alert and cooperative. Normal mood and affect. Heme/Lymph/Immune: No excessive bruising noted.    11/09/2015 10:48  AM   Disclaimer: This note was dictated with voice recognition software. Similar sounding words can inadvertently be transcribed and may not be corrected upon review.

## 2015-11-09 NOTE — Assessment & Plan Note (Signed)
Liver disease well, stated a previous visit. Generally asymptomatic from a hepatic standpoint. We will proceed with routine labs and imaging, return for follow-up in 6 months.

## 2015-11-09 NOTE — Progress Notes (Signed)
cc'ed to pcp °

## 2015-11-09 NOTE — Patient Instructions (Signed)
1. Have your labs drawn and ultrasound completed 1 year able to. 2. We will schedule your procedure for you. 3. Further recommendations to be based on results of your procedure. 4. Return for follow-up in 6 months for routine visit. Call us if any worsening problems before then.

## 2015-11-10 LAB — COMPREHENSIVE METABOLIC PANEL
ALBUMIN: 3.7 g/dL (ref 3.6–5.1)
ALT: 7 U/L (ref 6–29)
AST: 13 U/L (ref 10–35)
Alkaline Phosphatase: 52 U/L (ref 33–130)
BILIRUBIN TOTAL: 0.5 mg/dL (ref 0.2–1.2)
BUN: 19 mg/dL (ref 7–25)
CHLORIDE: 101 mmol/L (ref 98–110)
CO2: 26 mmol/L (ref 20–31)
CREATININE: 0.65 mg/dL (ref 0.50–0.99)
Calcium: 9 mg/dL (ref 8.6–10.4)
Glucose, Bld: 109 mg/dL — ABNORMAL HIGH (ref 65–99)
Potassium: 3.9 mmol/L (ref 3.5–5.3)
SODIUM: 136 mmol/L (ref 135–146)
Total Protein: 7.4 g/dL (ref 6.1–8.1)

## 2015-11-10 LAB — CBC WITH DIFFERENTIAL/PLATELET
BASOS ABS: 0 {cells}/uL (ref 0–200)
BASOS PCT: 0 %
EOS ABS: 48 {cells}/uL (ref 15–500)
Eosinophils Relative: 1 %
HEMATOCRIT: 31.8 % — AB (ref 35.0–45.0)
Hemoglobin: 10.8 g/dL — ABNORMAL LOW (ref 11.7–15.5)
LYMPHS PCT: 16 %
Lymphs Abs: 768 cells/uL — ABNORMAL LOW (ref 850–3900)
MCH: 29 pg (ref 27.0–33.0)
MCHC: 34 g/dL (ref 32.0–36.0)
MCV: 85.5 fL (ref 80.0–100.0)
MONO ABS: 432 {cells}/uL (ref 200–950)
MPV: 11.2 fL (ref 7.5–12.5)
Monocytes Relative: 9 %
Neutro Abs: 3552 cells/uL (ref 1500–7800)
Neutrophils Relative %: 74 %
PLATELETS: 116 10*3/uL — AB (ref 140–400)
RBC: 3.72 MIL/uL — ABNORMAL LOW (ref 3.80–5.10)
RDW: 13.4 % (ref 11.0–15.0)
WBC: 4.8 10*3/uL (ref 3.8–10.8)

## 2015-11-10 LAB — PROTIME-INR
INR: 1.1
PROTHROMBIN TIME: 11.8 s — AB (ref 9.0–11.5)

## 2015-11-10 LAB — AFP TUMOR MARKER: AFP-Tumor Marker: 2 ng/mL (ref <6.1)

## 2015-11-10 NOTE — Progress Notes (Signed)
LMOM to call.

## 2015-11-11 ENCOUNTER — Ambulatory Visit (HOSPITAL_COMMUNITY)
Admission: RE | Admit: 2015-11-11 | Discharge: 2015-11-11 | Disposition: A | Discharge status: Home / Self Care | Payer: Medicaid Other | Source: Ambulatory Visit | Attending: Nurse Practitioner | Admitting: Nurse Practitioner

## 2015-11-11 DIAGNOSIS — R938 Abnormal findings on diagnostic imaging of other specified body structures: Secondary | ICD-10-CM | POA: Insufficient documentation

## 2015-11-11 DIAGNOSIS — B1920 Unspecified viral hepatitis C without hepatic coma: Secondary | ICD-10-CM | POA: Diagnosis not present

## 2015-11-11 DIAGNOSIS — K7469 Other cirrhosis of liver: Secondary | ICD-10-CM | POA: Insufficient documentation

## 2015-11-11 DIAGNOSIS — K8020 Calculus of gallbladder without cholecystitis without obstruction: Secondary | ICD-10-CM | POA: Insufficient documentation

## 2015-11-11 DIAGNOSIS — R932 Abnormal findings on diagnostic imaging of liver and biliary tract: Secondary | ICD-10-CM | POA: Insufficient documentation

## 2015-11-11 NOTE — Progress Notes (Signed)
LMOM that labs look good, follow up in 6 months and call if you have questions.

## 2015-11-12 ENCOUNTER — Encounter (HOSPITAL_COMMUNITY): Payer: Self-pay

## 2015-11-12 ENCOUNTER — Encounter (HOSPITAL_COMMUNITY)
Admission: RE | Admit: 2015-11-12 | Discharge: 2015-11-12 | Disposition: A | Discharge status: Home / Self Care | Payer: Medicaid Other | Source: Ambulatory Visit | Attending: Gastroenterology | Admitting: Gastroenterology

## 2015-11-12 ENCOUNTER — Inpatient Hospital Stay (HOSPITAL_COMMUNITY)
Admission: RE | Admit: 2015-11-12 | Discharge: 2015-11-12 | Disposition: A | Discharge status: Home / Self Care | Payer: Medicaid Other | Source: Ambulatory Visit

## 2015-11-12 ENCOUNTER — Other Ambulatory Visit: Payer: Self-pay

## 2015-11-12 DIAGNOSIS — Z01812 Encounter for preprocedural laboratory examination: Secondary | ICD-10-CM | POA: Insufficient documentation

## 2015-11-12 NOTE — Progress Notes (Signed)
Pt is aware of results. 

## 2015-11-16 ENCOUNTER — Ambulatory Visit (HOSPITAL_COMMUNITY): Payer: Medicaid Other | Admitting: Anesthesiology

## 2015-11-16 ENCOUNTER — Encounter (HOSPITAL_COMMUNITY)
Admission: RE | Disposition: A | Discharge status: Home / Self Care | Payer: Self-pay | Source: Ambulatory Visit | Attending: Gastroenterology

## 2015-11-16 ENCOUNTER — Encounter (HOSPITAL_COMMUNITY): Payer: Self-pay | Admitting: *Deleted

## 2015-11-16 ENCOUNTER — Ambulatory Visit (HOSPITAL_COMMUNITY)
Admission: RE | Admit: 2015-11-16 | Discharge: 2015-11-16 | Disposition: A | Discharge status: Home / Self Care | Payer: Medicaid Other | Source: Ambulatory Visit | Attending: Gastroenterology | Admitting: Gastroenterology

## 2015-11-16 DIAGNOSIS — J45909 Unspecified asthma, uncomplicated: Secondary | ICD-10-CM | POA: Insufficient documentation

## 2015-11-16 DIAGNOSIS — M549 Dorsalgia, unspecified: Secondary | ICD-10-CM | POA: Diagnosis not present

## 2015-11-16 DIAGNOSIS — Q394 Esophageal web: Secondary | ICD-10-CM | POA: Insufficient documentation

## 2015-11-16 DIAGNOSIS — F329 Major depressive disorder, single episode, unspecified: Secondary | ICD-10-CM | POA: Insufficient documentation

## 2015-11-16 DIAGNOSIS — K7460 Unspecified cirrhosis of liver: Secondary | ICD-10-CM | POA: Insufficient documentation

## 2015-11-16 DIAGNOSIS — G8929 Other chronic pain: Secondary | ICD-10-CM | POA: Diagnosis not present

## 2015-11-16 DIAGNOSIS — Z8619 Personal history of other infectious and parasitic diseases: Secondary | ICD-10-CM | POA: Diagnosis not present

## 2015-11-16 DIAGNOSIS — M1990 Unspecified osteoarthritis, unspecified site: Secondary | ICD-10-CM | POA: Diagnosis not present

## 2015-11-16 DIAGNOSIS — Z79899 Other long term (current) drug therapy: Secondary | ICD-10-CM | POA: Insufficient documentation

## 2015-11-16 DIAGNOSIS — Z87891 Personal history of nicotine dependence: Secondary | ICD-10-CM | POA: Insufficient documentation

## 2015-11-16 DIAGNOSIS — K449 Diaphragmatic hernia without obstruction or gangrene: Secondary | ICD-10-CM | POA: Diagnosis not present

## 2015-11-16 DIAGNOSIS — R1310 Dysphagia, unspecified: Secondary | ICD-10-CM | POA: Insufficient documentation

## 2015-11-16 HISTORY — PX: SAVORY DILATION: SHX5439

## 2015-11-16 HISTORY — PX: ESOPHAGOGASTRODUODENOSCOPY (EGD) WITH PROPOFOL: SHX5813

## 2015-11-16 SURGERY — ESOPHAGOGASTRODUODENOSCOPY (EGD) WITH PROPOFOL
Anesthesia: Monitor Anesthesia Care

## 2015-11-16 MED ORDER — ONDANSETRON HCL 4 MG/2ML IJ SOLN: 4.0000 mg | Freq: Once | INTRAMUSCULAR | Status: DC | PRN

## 2015-11-16 MED ORDER — CHLORHEXIDINE GLUCONATE CLOTH 2 % EX PADS: 6.0000 | MEDICATED_PAD | Freq: Once | CUTANEOUS | Status: DC

## 2015-11-16 MED ORDER — MIDAZOLAM HCL 2 MG/2ML IJ SOLN
1.0000 mg | INTRAMUSCULAR | Status: DC | PRN
  Administered 2015-11-16: 2 mg via INTRAVENOUS

## 2015-11-16 MED ORDER — MIDAZOLAM HCL 2 MG/2ML IJ SOLN
INTRAMUSCULAR | Status: AC
  Filled 2015-11-16: qty 2

## 2015-11-16 MED ORDER — FENTANYL CITRATE (PF) 100 MCG/2ML IJ SOLN
INTRAMUSCULAR | Status: AC
  Filled 2015-11-16: qty 2

## 2015-11-16 MED ORDER — STERILE WATER FOR IRRIGATION IR SOLN
Status: DC | PRN
  Administered 2015-11-16: 100 mL

## 2015-11-16 MED ORDER — FENTANYL CITRATE (PF) 100 MCG/2ML IJ SOLN
25.0000 ug | INTRAMUSCULAR | Status: DC | PRN
Start: 2015-11-16 — End: 2015-11-16
  Administered 2015-11-16: 25 ug via INTRAVENOUS

## 2015-11-16 MED ORDER — LACTATED RINGERS IV SOLN
INTRAVENOUS | Status: DC
  Administered 2015-11-16: 08:00:00 via INTRAVENOUS

## 2015-11-16 MED ORDER — PROPOFOL 10 MG/ML IV BOLUS
INTRAVENOUS | Status: AC
Start: 2015-11-16 — End: 2015-11-16
  Filled 2015-11-16: qty 20

## 2015-11-16 MED ORDER — MIDAZOLAM HCL 5 MG/5ML IJ SOLN
INTRAMUSCULAR | Status: DC | PRN
  Administered 2015-11-16: 2 mg via INTRAVENOUS

## 2015-11-16 MED ORDER — ONDANSETRON HCL 4 MG/2ML IJ SOLN
INTRAMUSCULAR | Status: AC
  Filled 2015-11-16: qty 2

## 2015-11-16 MED ORDER — ONDANSETRON HCL 4 MG/2ML IJ SOLN
4.0000 mg | Freq: Once | INTRAMUSCULAR | Status: AC
Start: 2015-11-16 — End: 2015-11-16
  Administered 2015-11-16: 4 mg via INTRAVENOUS

## 2015-11-16 MED ORDER — LIDOCAINE VISCOUS 2 % MT SOLN
5.0000 mL | OROMUCOSAL | Status: AC
  Administered 2015-11-16 (×2): 5 mL via OROMUCOSAL

## 2015-11-16 MED ORDER — LIDOCAINE VISCOUS 2 % MT SOLN
OROMUCOSAL | Status: AC
  Filled 2015-11-16: qty 15

## 2015-11-16 MED ORDER — PROPOFOL 500 MG/50ML IV EMUL
INTRAVENOUS | Status: DC | PRN
  Administered 2015-11-16: 100 ug/kg/min via INTRAVENOUS

## 2015-11-16 MED ORDER — FENTANYL CITRATE (PF) 100 MCG/2ML IJ SOLN: 25.0000 ug | INTRAMUSCULAR | Status: DC | PRN

## 2015-11-16 NOTE — H&P (Signed)
Primary Care Physician:  Collene Mares, PA-C Primary Gastroenterologist:  Dr. Oneida Alar  Pre-Procedure History & Physical: HPI:  Tiffany Hale is a 60 y.o. female here for DYSPHAGIA/WEIHT LOSS.  Past Medical History:  Diagnosis Date  . Arthritis   . Asthma   . BMI (body mass index) 20.0-29.9 2010 155 LBS  . Chronic back pain   . Cirrhosis ([place]) NOV 2011   MRI LIVER-5 SML DYSPLASTIC NODULES; 2009: TBILI 0.7 ALK PHOS 128 AST 56 ALT 53 ALB 3.8 INR 1.3  . 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 HBV (hepatitis B virus) infection 2009: HBsAb POS  . HCV (hepatitis C virus) 2009   COMPLETED RX 2015-OCT 2015 VL UNDETECTABLE  . Hepatitis A 2009 Ab POS  . Tobacco abuse     Past Surgical History:  Procedure Laterality Date  . BIOPSY N/A 07/21/2014   Procedure: BIOPSY;  Surgeon: Danie Binder, MD;  Location: [facility];  Service: Endoscopy;  Laterality: N/A;  Gastric  . COLONOSCOPY  OCT 2011    6 MM SIMPLE ADENOMA, BENIGN COLONIC MUCOSA  . ESOPHAGOGASTRODUODENOSCOPY  MAR 2011   Grade 1 varices, GASTRITIS  . ESOPHAGOGASTRODUODENOSCOPY (EGD) WITH PROPOFOL N/A 07/09/2012   SLF:The mucosa of the esophagus appeared normal/Small hiatal hernia/MODERATE Non-erosive gastritis (inflammation) with hemorrhage was found in the gastric antrum; multiple biopsies (reactive gastropathy). No H.pylori. No varices.Next EGD 07/2014.  Marland Kitchen ESOPHAGOGASTRODUODENOSCOPY (EGD) WITH PROPOFOL N/A 07/21/2014   SLF: 1. No esophageal , gastric, or duodanl varices seen. 2. Mild non-erosive gastritis.  . Extremity Surgery     and Pelvic, after fall  . FOOT SURGERY Right 2009   [facility]-crushed foot with fall and it was repaired  . TOTAL ABDOMINAL HYSTERECTOMY  1995 DUB    Prior to Admission medications   Medication Sig Start Date End Date Taking? Authorizing Provider  ARIPiprazole (ABILIFY) 15 MG tablet Take 15 mg by mouth daily.     Yes Historical Provider, MD  conjugated estrogens (PREMARIN) vaginal cream Use 1  gram a night 07/29/15  Yes Florian Buff, MD  escitalopram (LEXAPRO) 20 MG tablet Take 30 mg by mouth daily.    Yes Historical Provider, MD  methylphenidate (CONCERTA) 36 MG CR tablet Take 36 mg by mouth every morning.     Yes Historical Provider, MD  omeprazole (PRILOSEC) 20 MG capsule TAKE 1 CAPSULE BY MOUTH EVERY MORNING 05/11/15  Yes Carlis Stable, NP  oxybutynin (DITROPAN-XL) 10 MG 24 hr tablet Take 10 mg by mouth daily.   Yes Historical Provider, MD  oxycodone (OXY-IR) 5 MG capsule Take 5 mg by mouth every 4 (four) hours as needed.   Yes Historical Provider, MD  traZODone (DESYREL) 150 MG tablet Take 150 mg by mouth at bedtime.    Yes Historical Provider, MD  zolpidem (AMBIEN) 10 MG tablet Take 10 mg by mouth at bedtime.   Yes Historical Provider, MD    Allergies as of 11/09/2015  . (No Known Allergies)    Family History  Problem Relation Age of Onset  . Birth defects Brother   . Cancer Maternal Grandmother   . Cancer Maternal Grandfather   . Colon cancer Neg Hx   . Colon polyps Neg Hx     Social History   Social History  . Marital status: Divorced    Spouse name: N/A  . Number of children: N/A  . Years of education: N/A   Occupational History  . Not on file.   Social  History Main Topics  . Smoking status: Former Smoker    Packs/day: 0.50    Years: 40.00    Types: Cigarettes    Quit date: 05/25/2014  . Smokeless tobacco: Never Used     Comment: 3 days to go through a pack   . Alcohol use No     Comment: history of ETOH abuse, no alcohol X 10 years   . Drug use: No  . Sexual activity: Not Currently   Other Topics Concern  . Not on file   Social History Narrative  . No narrative on file    Review of Systems: See HPI, otherwise negative ROS   Physical Exam: BP (!) 141/118   Pulse 62   Temp 98.8 F (37.1 C) (Oral)   Resp 14   Ht 5' 4"  (1.626 m)   Wt 119 lb (54 kg)   SpO2 96%   BMI 20.43 kg/m  General:   Alert,  pleasant and cooperative in NAD Head:   Normocephalic and atraumatic. Neck:  Supple; Lungs:  Clear throughout to auscultation.    Heart:  Regular rate and rhythm. Abdomen:  Soft, nontender and nondistended. Normal bowel sounds, without guarding, and without rebound.   Neurologic:  Alert and  oriented x4;  grossly normal neurologically.  Impression/Plan:     DYSPHAGIA  PLAN:  EGD/POSSIBLE DIL TODAY. DISCUSSED PROCEDURE, BENEFITS, & RISKS: < 1% chance of medication reaction, PERFORATION, OR bleeding.

## 2015-11-16 NOTE — Brief Op Note (Signed)
11/16/2015  9:02 AM  PATIENT:  Tiffany Hale  60 y.o. female  PRE-OPERATIVE DIAGNOSIS:  dysphagia  POST-OPERATIVE DIAGNOSIS:  hiatal hernia, distal esophageal web  PROCEDURE:  Procedure(s) with comments: ESOPHAGOGASTRODUODENOSCOPY (EGD) WITH PROPOFOL (N/A) - 815 SAVORY DILATION (N/A) 14-16 MM DILATOR  SURGEON:  Surgeon(s) and Role:    * Danie Binder, MD - Primary  SEE AVS FOR ADDITONAL FINDINGS AND RECOMMENDATIONS

## 2015-11-16 NOTE — Transfer of Care (Signed)
Immediate Anesthesia Transfer of Care Note  Patient: Tiffany Hale  Procedure(s) Performed: Procedure(s) with comments: ESOPHAGOGASTRODUODENOSCOPY (EGD) WITH PROPOFOL (N/A) - 815 SAVORY DILATION (N/A)  Patient Location: PACU  Anesthesia Type:MAC  Level of Consciousness: sedated  Airway & Oxygen Therapy: Patient Spontanous Breathing and Patient connected to face mask oxygen  Post-op Assessment: Report given to RN and Post -op Vital signs reviewed and stable  Post vital signs: Reviewed and stable  Last Vitals:  Vitals:   11/16/15 0805 11/16/15 0810  BP: (!) 141/118   Pulse:    Resp: 12 14  Temp:      Last Pain:  Vitals:   11/16/15 0724  TempSrc: Oral      Patients Stated Pain Goal: 7 ([id number])  Complications: No apparent anesthesia complications

## 2015-11-16 NOTE — Discharge Instructions (Signed)
I dilated your esophagus. You have a stricture near the base of your esophagus.  You have a SMALL HIATAL HERNIA.   USE CARNATION INSTANT BREAKFAST MIXED IN ICE CREAM FOUR TIMES A DAY FOUR TIMES A DAY TO PREVENT WEIGHT LOSS AND TO GAIN WEIGHT.  CONTINUE OMEPRAZOLE.  TAKE 30 MINUTES PRIOR TO YOUR FIRST MEAL.  FOLLOW UP IN FEB 2018.   UPPER ENDOSCOPY AFTER CARE Read the instructions outlined below and refer to this sheet in the next week. These discharge instructions provide you with general information on caring for yourself after you leave the hospital. While your treatment has been planned according to the most current medical practices available, unavoidable complications occasionally occur. If you have any problems or questions after discharge, call DR. Sylvan Lahm, [phone].  ACTIVITY  You may resume your regular activity, but move at a slower pace for the next 24 hours.   Take frequent rest periods for the next 24 hours.   Walking will help get rid of the air and reduce the bloated feeling in your belly (abdomen).   No driving for 24 hours (because of the medicine (anesthesia) used during the test).   You may shower.   Do not sign any important legal documents or operate any machinery for 24 hours (because of the anesthesia used during the test).    NUTRITION  Drink plenty of fluids.   You may resume your normal diet as instructed by your doctor.   Begin with a light meal and progress to your normal diet. Heavy or fried foods are harder to digest and may make you feel sick to your stomach (nauseated).   Avoid alcoholic beverages for 24 hours or as instructed.    MEDICATIONS  You may resume your normal medications.   WHAT YOU CAN EXPECT TODAY  Some feelings of bloating in the abdomen.   Passage of more gas than usual.    IF YOU HAD A BIOPSY TAKEN DURING THE UPPER ENDOSCOPY:  Eat a soft diet IF YOU HAVE NAUSEA, BLOATING, ABDOMINAL PAIN, OR VOMITING.      FINDING OUT THE RESULTS OF YOUR TEST Not all test results are available during your visit. DR. Oneida Alar WILL CALL YOU WITHIN 7 DAYS OF YOUR PROCEDUE WITH YOUR RESULTS. Do not assume everything is normal if you have not heard from DR. Kamarion Zagami IN ONE WEEK, CALL HER OFFICE AT [phone].  SEEK IMMEDIATE MEDICAL ATTENTION AND CALL THE OFFICE: [phone] IF:  You have more than a spotting of blood in your stool.   Your belly is swollen (abdominal distention).   You are nauseated or vomiting.   You have a temperature over 101F.   You have abdominal pain or discomfort that is severe or gets worse throughout the day.   ESOPHAGEAL STRICTURE  Esophageal strictures can be caused by stomach acid backing up into the tube that carries food from the mouth down to the stomach (lower esophagus).  TREATMENT There are a number of medicines used to treat reflux/stricture, including: Antacids.  Proton-pump inhibitors: OMEPRAZOLE  HOME CARE INSTRUCTIONS Eat 2-3 hours before going to bed.  Try to reach and maintain a healthy weight.  Do not eat just a few very large meals. Instead, eat 4 TO 6 smaller meals throughout the day.  Try to identify foods and beverages that make your symptoms worse, and avoid these.  Avoid tight clothing.  Do not exercise right after eating.

## 2015-11-16 NOTE — Anesthesia Postprocedure Evaluation (Signed)
Anesthesia Post Note  Patient: ARIANNIE PATON  Procedure(s) Performed: Procedure(s) (LRB): ESOPHAGOGASTRODUODENOSCOPY (EGD) WITH PROPOFOL (N/A) SAVORY DILATION (N/A)  Patient location during evaluation: PACU Anesthesia Type: MAC Level of consciousness: awake and patient cooperative Pain management: pain level controlled Vital Signs Assessment: post-procedure vital signs reviewed and stable Respiratory status: spontaneous breathing, nonlabored ventilation and respiratory function stable Cardiovascular status: blood pressure returned to baseline, bradycardic and stable Postop Assessment: no signs of nausea or vomiting Anesthetic complications: no    Last Vitals:  Vitals:   11/16/15 0805 11/16/15 0810  BP: (!) 141/118   Pulse:    Resp: 12 14  Temp:      Last Pain:  Vitals:   11/16/15 0724  TempSrc: Oral                 Jaidan Stachnik J

## 2015-11-16 NOTE — Op Note (Signed)
[facility] Patient Name: Tiffany Hale Procedure Date: 11/16/2015 8:37 AM MRN: [id number] Date of Birth: [birth year] Attending MD: Barney Drain , MD CSN: [id number] Age: 60 Admit Type: Outpatient Procedure:                Upper GI endoscopy WITH ESOPHAGEAL DILATION Indications:              Dysphagia IN PT WIITH CIRRHOSIS/PLT COUNT 115 K.                            PROBLEMS WITH ILL-FITTING DENTURES. AS WELL SISTER                            REPORTS PT ONLY EATS 5 THINGS BEACUSE SHE'S AFRAID                            TO GAIN WEIGHT. LOST 12 LBS SINCE JUN 2016. Providers:                Barney Drain, MD, Rosina Lowenstein, RN, Randa Spike,                            Technician Referring MD:              Medicines:                Propofol per Anesthesia Complications:            No immediate complications. Estimated Blood Loss:     Estimated blood loss was minimal. Procedure:                Pre-Anesthesia Assessment:                           - Prior to the procedure, a History and Physical                            was performed, and patient medications and                            allergies were reviewed. The patient's tolerance of                            previous anesthesia was also reviewed. The risks                            and benefits of the procedure and the sedation                            options and risks were discussed with the patient.                            All questions were answered, and informed consent                            was obtained. Prior Anticoagulants: The patient has  taken no previous anticoagulant or antiplatelet                            agents. ASA Grade Assessment: II - A patient with                            mild systemic disease. After reviewing the risks                            and benefits, the patient was deemed in                            satisfactory condition to undergo the procedure.                         After obtaining informed consent, the endoscope was                            passed under direct vision. Throughout the                            procedure, the patient's blood pressure, pulse, and                            oxygen saturations were monitored continuously. The                            [id number] scope was introduced through the                            mouth, and advanced to the second part of duodenum.                            The upper GI endoscopy was accomplished without                            difficulty. The patient tolerated the procedure                            well. 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 A guidewire was       placed and the scope was withdrawn. Dilation was performed with a Savary       dilator with mild resistance at 14 mm, 15 mm and 16 mm.      SMALL HIATAL HERNIA. OTHERWISE, The entire examined stomach was normal.      The examined duodenum was normal. 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 Dilated. Moderate Sedation:      Per Anesthesia Care Recommendation:           - Patient has a contact number available for  emergencies. The signs and symptoms of potential                            delayed complications were discussed with the                            patient. Return to normal activities tomorrow.                            Written discharge instructions were provided to the                            patient.                           - Resume previous diet. ADD CIB WITH ICE CREAM QID                            TO PREVENT WEIGHT LOSS AND MAINTAIN NUTRITION. PT                            INFORMED OF RISKS OF NOT CONSUMING ENOUGH                            CALORIES/PROTEIN(ASCITES/LEG EDEMA/INCREASED RISK                             OF INFECTION).                           - Continue present medications.                           - Return to my office in 4 months. Procedure Code(s):        --- Professional ---                           [phone], Esophagogastroduodenoscopy, flexible,                            transoral; with insertion of guide wire followed by                            passage of dilator(s) through esophagus over guide                            wire Diagnosis Code(s):        --- Professional ---                           Q39.4, Esophageal web                           R13.10, Dysphagia, unspecified CPT copyright 2016 American Medical Association. All rights reserved. The codes documented in this report are preliminary and upon coder review may  be revised to meet current  compliance requirements. Barney Drain, MD Barney Drain, MD 11/16/2015 11:12:34 AM This report has been signed electronically. Number of Addenda: 0

## 2015-11-16 NOTE — Anesthesia Preprocedure Evaluation (Signed)
Anesthesia Evaluation  Patient identified by MRN, date of birth, ID band Patient awake    Reviewed: Allergy & Precautions, H&P , NPO status , Patient's Chart, lab work & pertinent test results  Airway Mallampati: II  TM Distance: >3 FB     Dental  (+) Edentulous Upper, Edentulous Lower   Pulmonary asthma , former smoker,    breath sounds clear to auscultation       Cardiovascular negative cardio ROS   Rhythm:Regular Rate:Normal     Neuro/Psych PSYCHIATRIC DISORDERS Depression    GI/Hepatic (+) Cirrhosis   Esophageal Varices    , Hepatitis -, A, C  Endo/Other    Renal/GU      Musculoskeletal  (+) Arthritis ,   Abdominal   Peds  Hematology   Anesthesia Other Findings   Reproductive/Obstetrics                             Anesthesia Physical Anesthesia Plan  ASA: III  Anesthesia Plan: MAC   Post-op Pain Management:    Induction: Intravenous  Airway Management Planned: Simple Face Mask  Additional Equipment:   Intra-op Plan:   Post-operative Plan:   Informed Consent: I have reviewed the patients History and Physical, chart, labs and discussed the procedure including the risks, benefits and alternatives for the proposed anesthesia with the patient or authorized representative who has indicated his/her understanding and acceptance.     Plan Discussed with:   Anesthesia Plan Comments:         Anesthesia Quick Evaluation

## 2015-11-23 ENCOUNTER — Telehealth: Payer: Self-pay

## 2015-11-23 NOTE — Telephone Encounter (Signed)
Pt called for lab results and was given the results. She wanted to know if Walden Field, NP had sent him in a prescription for Bentyl and I told her it did not look like he had. She would like a prescription sent to [facility] in [place]. Said she has taken it before and it helps a lot.

## 2015-11-24 ENCOUNTER — Encounter (HOSPITAL_COMMUNITY): Payer: Self-pay | Admitting: Gastroenterology

## 2015-11-24 MED ORDER — DICYCLOMINE HCL 10 MG PO CAPS: 10.0000 mg | ORAL_CAPSULE | Freq: Three times a day (TID) | ORAL | 1 refills | Status: DC

## 2015-11-24 NOTE — Telephone Encounter (Signed)
LMOM that Rx has been sent in.  

## 2015-11-24 NOTE — Telephone Encounter (Signed)
Rx sent to pharmacy per patient request. 

## 2016-02-23 ENCOUNTER — Encounter (HOSPITAL_BASED_OUTPATIENT_CLINIC_OR_DEPARTMENT_OTHER): Payer: Self-pay | Admitting: *Deleted

## 2016-02-28 ENCOUNTER — Encounter (HOSPITAL_COMMUNITY)
Admission: RE | Admit: 2016-02-28 | Discharge: 2016-02-28 | Disposition: A | Discharge status: Home / Self Care | Payer: Medicaid Other | Source: Ambulatory Visit | Attending: Orthopedic Surgery | Admitting: Orthopedic Surgery

## 2016-02-28 ENCOUNTER — Ambulatory Visit: Payer: Self-pay | Admitting: Physician Assistant

## 2016-02-28 DIAGNOSIS — Z01812 Encounter for preprocedural laboratory examination: Secondary | ICD-10-CM | POA: Diagnosis present

## 2016-02-28 LAB — COMPREHENSIVE METABOLIC PANEL
ALBUMIN: 3.7 g/dL (ref 3.5–5.0)
ALK PHOS: 66 U/L (ref 38–126)
ALT: 15 U/L (ref 14–54)
AST: 18 U/L (ref 15–41)
Anion gap: 8 (ref 5–15)
BUN: 11 mg/dL (ref 6–20)
CALCIUM: 9.2 mg/dL (ref 8.9–10.3)
CHLORIDE: 99 mmol/L — AB (ref 101–111)
CO2: 27 mmol/L (ref 22–32)
CREATININE: 0.55 mg/dL (ref 0.44–1.00)
GFR calc non Af Amer: >60 mL/min (ref >60)
GLUCOSE: 114 mg/dL — AB (ref 65–99)
Potassium: 3.8 mmol/L (ref 3.5–5.1)
SODIUM: 134 mmol/L — AB (ref 135–145)
Total Bilirubin: 0.8 mg/dL (ref 0.3–1.2)
Total Protein: 8 g/dL (ref 6.5–8.1)

## 2016-02-28 NOTE — H&P (Signed)
Tiffany Hale is an 60 y.o. female.   Chief Complaint: Follow-up left shoulder rotator cuff tear. HPI: The patient was seen in the office last month. MRI diagnosed partial supraspinatus tear, Type II acromion. We had elected conservative treatment at the time. Corticosteroid injection did give her some temporary relief but now symptoms are returning and she is here to discuss potential surgical option.  Past Medical History:  Diagnosis Date  . Anxiety   . Arthritis    OA left shoulder  . Asthma   . BMI (body mass index) 20.0-29.9 2010 155 LBS  . Chronic back pain   . Cirrhosis ([place]) NOV 2011   MRI LIVER-5 SML DYSPLASTIC NODULES; 2009: TBILI 0.7 ALK PHOS 128 AST 56 ALT 53 ALB 3.8 INR 1.3  . 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 GERD (gastroesophageal reflux disease)   . HBV (hepatitis B virus) infection 2009: HBsAb POS  . HCV (hepatitis C virus) 2009   COMPLETED RX 2015-OCT 2015 VL UNDETECTABLE  . Hepatitis A 2009 Ab POS  . Tobacco abuse     Past Surgical History:  Procedure Laterality Date  . BIOPSY N/A 07/21/2014   Procedure: BIOPSY;  Surgeon: Danie Binder, MD;  Location: [facility];  Service: Endoscopy;  Laterality: N/A;  Gastric  . COLONOSCOPY  OCT 2011    6 MM SIMPLE ADENOMA, BENIGN COLONIC MUCOSA  . ESOPHAGOGASTRODUODENOSCOPY  MAR 2011   Grade 1 varices, GASTRITIS  . ESOPHAGOGASTRODUODENOSCOPY (EGD) WITH PROPOFOL N/A 07/09/2012   SLF:The mucosa of the esophagus appeared normal/Small hiatal hernia/MODERATE Non-erosive gastritis (inflammation) with hemorrhage was found in the gastric antrum; multiple biopsies (reactive gastropathy). No H.pylori. No varices.Next EGD 07/2014.  Marland Kitchen ESOPHAGOGASTRODUODENOSCOPY (EGD) WITH PROPOFOL N/A 07/21/2014   SLF: 1. No esophageal , gastric, or duodanl varices seen. 2. Mild non-erosive gastritis.  Marland Kitchen ESOPHAGOGASTRODUODENOSCOPY (EGD) WITH PROPOFOL N/A 11/16/2015   Procedure: ESOPHAGOGASTRODUODENOSCOPY (EGD) WITH PROPOFOL;  Surgeon:  Danie Binder, MD;  Location: [facility];  Service: Endoscopy;  Laterality: N/A;  815  . Extremity Surgery     and Pelvic, after fall  . FOOT FRACTURE SURGERY    . FOOT SURGERY Right 2009   [facility]-crushed foot with fall and it was repaired  . SAVORY DILATION N/A 11/16/2015   Procedure: SAVORY DILATION;  Surgeon: Danie Binder, MD;  Location: [facility];  Service: Endoscopy;  Laterality: N/A;  . TOTAL ABDOMINAL HYSTERECTOMY  1995 DUB    Family History  Problem Relation Age of Onset  . Birth defects Brother   . Cancer Maternal Grandmother   . Cancer Maternal Grandfather   . Colon cancer Neg Hx   . Colon polyps Neg Hx    Social History:  reports that she quit smoking about 21 months ago. Her smoking use included Cigarettes. She has a 20.00 pack-year smoking history. She has never used smokeless tobacco. She reports that she does not drink alcohol or use drugs.  Allergies: No Known Allergies   (Not in a hospital admission)  Results for orders placed or performed during the hospital encounter of 02/28/16 (from the past 48 hour(s))  Comprehensive metabolic panel     Status: Abnormal   Collection Time: 02/28/16 10:12 AM  Result Value Ref Range   Sodium 134 (L) 135 - 145 mmol/L   Potassium 3.8 3.5 - 5.1 mmol/L   Chloride 99 (L) 101 - 111 mmol/L   CO2 27 22 - 32 mmol/L   Glucose, Bld 114 (H)  65 - 99 mg/dL   BUN 11 6 - 20 mg/dL   Creatinine, Ser 0.55 0.44 - 1.00 mg/dL   Calcium 9.2 8.9 - 10.3 mg/dL   Total Protein 8.0 6.5 - 8.1 g/dL   Albumin 3.7 3.5 - 5.0 g/dL   AST 18 15 - 41 U/L   ALT 15 14 - 54 U/L   Alkaline Phosphatase 66 38 - 126 U/L   Total Bilirubin 0.8 0.3 - 1.2 mg/dL   GFR calc non Af Amer >60 >60 mL/min   GFR calc Af Amer >60 >60 mL/min    Comment: (NOTE) The eGFR has been calculated using the CKD EPI equation. This calculation has not been validated in all clinical situations. eGFR's persistently <60 mL/min signify possible Chronic Kidney Disease.    Anion  gap 8 5 - 15   No results found.  Review of Systems  Respiratory: Positive for shortness of breath.   Musculoskeletal: Positive for joint pain.  Skin: Positive for rash.  Endo/Heme/Allergies: Bruises/bleeds easily.  Psychiatric/Behavioral: Positive for depression.  All other systems reviewed and are negative.   There were no vitals taken for this visit. Physical Exam  Constitutional: She is oriented to person, place, and time. She appears well-developed and well-nourished. No distress.  HENT:  Head: Normocephalic and atraumatic.  Nose: Nose normal.  Eyes: Conjunctivae and EOM are normal. Pupils are equal, round, and reactive to light.  Neck: Normal range of motion. Neck supple.  Cardiovascular: Normal rate and intact distal pulses.   Respiratory: Effort normal. No respiratory distress.  GI: Soft. She exhibits no distension. There is no tenderness.  Musculoskeletal:       Left shoulder: She exhibits decreased range of motion, tenderness, pain and decreased strength.  Lymphadenopathy:    She has no cervical adenopathy.  Neurological: She is alert and oriented to person, place, and time.  Skin: Skin is warm and dry. No erythema.  Psychiatric: She has a normal mood and affect. Her behavior is normal.     Assessment/Plan Discussed risks and benefits of outpatient left shoulder arthroscopy, debridement, arthroscopic versus open rotator cuff repair likely; possibly distal clavicle and acromioplasty.  She would like to proceed. This will be set up as soon as scheduling allows.   Chriss Czar, PA-C 02/28/2016, 4:43 PM

## 2016-03-01 ENCOUNTER — Encounter (HOSPITAL_BASED_OUTPATIENT_CLINIC_OR_DEPARTMENT_OTHER)
Admission: RE | Disposition: A | Discharge status: Home / Self Care | Payer: Self-pay | Source: Ambulatory Visit | Attending: Orthopedic Surgery

## 2016-03-01 ENCOUNTER — Encounter (HOSPITAL_BASED_OUTPATIENT_CLINIC_OR_DEPARTMENT_OTHER): Payer: Self-pay | Admitting: Anesthesiology

## 2016-03-01 ENCOUNTER — Ambulatory Visit (HOSPITAL_BASED_OUTPATIENT_CLINIC_OR_DEPARTMENT_OTHER): Payer: Medicaid Other | Admitting: Anesthesiology

## 2016-03-01 ENCOUNTER — Ambulatory Visit (HOSPITAL_BASED_OUTPATIENT_CLINIC_OR_DEPARTMENT_OTHER)
Admission: RE | Admit: 2016-03-01 | Discharge: 2016-03-01 | Disposition: A | Discharge status: Home / Self Care | Payer: Medicaid Other | Source: Ambulatory Visit | Attending: Orthopedic Surgery | Admitting: Orthopedic Surgery

## 2016-03-01 DIAGNOSIS — M75122 Complete rotator cuff tear or rupture of left shoulder, not specified as traumatic: Secondary | ICD-10-CM | POA: Diagnosis not present

## 2016-03-01 DIAGNOSIS — M19012 Primary osteoarthritis, left shoulder: Secondary | ICD-10-CM | POA: Insufficient documentation

## 2016-03-01 DIAGNOSIS — K219 Gastro-esophageal reflux disease without esophagitis: Secondary | ICD-10-CM | POA: Insufficient documentation

## 2016-03-01 DIAGNOSIS — Z87891 Personal history of nicotine dependence: Secondary | ICD-10-CM | POA: Diagnosis not present

## 2016-03-01 DIAGNOSIS — S43492A Other sprain of left shoulder joint, initial encounter: Secondary | ICD-10-CM | POA: Insufficient documentation

## 2016-03-01 DIAGNOSIS — Z79899 Other long term (current) drug therapy: Secondary | ICD-10-CM | POA: Diagnosis not present

## 2016-03-01 DIAGNOSIS — K7460 Unspecified cirrhosis of liver: Secondary | ICD-10-CM | POA: Insufficient documentation

## 2016-03-01 DIAGNOSIS — F329 Major depressive disorder, single episode, unspecified: Secondary | ICD-10-CM | POA: Insufficient documentation

## 2016-03-01 DIAGNOSIS — X58XXXA Exposure to other specified factors, initial encounter: Secondary | ICD-10-CM | POA: Insufficient documentation

## 2016-03-01 DIAGNOSIS — M25812 Other specified joint disorders, left shoulder: Secondary | ICD-10-CM | POA: Insufficient documentation

## 2016-03-01 DIAGNOSIS — M75102 Unspecified rotator cuff tear or rupture of left shoulder, not specified as traumatic: Secondary | ICD-10-CM | POA: Diagnosis present

## 2016-03-01 HISTORY — DX: Gastro-esophageal reflux disease without esophagitis: K21.9

## 2016-03-01 HISTORY — PX: SHOULDER ARTHROSCOPY WITH OPEN ROTATOR CUFF REPAIR AND DISTAL CLAVICLE ACROMINECTOMY: SHX5683

## 2016-03-01 HISTORY — PX: SHOULDER ACROMIOPLASTY: SHX6093

## 2016-03-01 HISTORY — DX: Anxiety disorder, unspecified: F41.9

## 2016-03-01 SURGERY — SHOULDER ARTHROSCOPY WITH OPEN ROTATOR CUFF REPAIR AND DISTAL CLAVICLE ACROMINECTOMY
Anesthesia: Regional | Site: Shoulder | Laterality: Left

## 2016-03-01 MED ORDER — DEXAMETHASONE SODIUM PHOSPHATE 4 MG/ML IJ SOLN
INTRAMUSCULAR | Status: DC | PRN
  Administered 2016-03-01: 10 mg via INTRAVENOUS

## 2016-03-01 MED ORDER — SODIUM CHLORIDE 0.9 % IV SOLN: INTRAVENOUS | Status: DC

## 2016-03-01 MED ORDER — LIDOCAINE HCL 4 % EX SOLN
CUTANEOUS | Status: DC | PRN
  Administered 2016-03-01: 3 mL via TOPICAL

## 2016-03-01 MED ORDER — PROPOFOL 10 MG/ML IV BOLUS
INTRAVENOUS | Status: DC | PRN
  Administered 2016-03-01: 140 mg via INTRAVENOUS

## 2016-03-01 MED ORDER — FENTANYL CITRATE (PF) 100 MCG/2ML IJ SOLN
INTRAMUSCULAR | Status: AC
  Filled 2016-03-01: qty 2

## 2016-03-01 MED ORDER — FENTANYL CITRATE (PF) 100 MCG/2ML IJ SOLN
50.0000 ug | INTRAMUSCULAR | Status: DC | PRN
  Administered 2016-03-01: 50 ug via INTRAVENOUS

## 2016-03-01 MED ORDER — SODIUM CHLORIDE 0.9 % IR SOLN
Status: DC | PRN
  Administered 2016-03-01: 500 mL

## 2016-03-01 MED ORDER — SUCCINYLCHOLINE CHLORIDE 20 MG/ML IJ SOLN
INTRAMUSCULAR | Status: DC | PRN
  Administered 2016-03-01: 60 mg via INTRAVENOUS

## 2016-03-01 MED ORDER — MIDAZOLAM HCL 2 MG/2ML IJ SOLN
INTRAMUSCULAR | Status: AC
  Filled 2016-03-01: qty 2

## 2016-03-01 MED ORDER — CEFAZOLIN SODIUM-DEXTROSE 2-4 GM/100ML-% IV SOLN
2.0000 g | INTRAVENOUS | Status: AC
  Administered 2016-03-01: 2 g via INTRAVENOUS

## 2016-03-01 MED ORDER — OXYCODONE-ACETAMINOPHEN 5-325 MG PO TABS: 1.0000 | ORAL_TABLET | ORAL | 0 refills | Status: DC | PRN

## 2016-03-01 MED ORDER — CEFAZOLIN SODIUM-DEXTROSE 2-4 GM/100ML-% IV SOLN
INTRAVENOUS | Status: AC
  Filled 2016-03-01: qty 100

## 2016-03-01 MED ORDER — MIDAZOLAM HCL 2 MG/2ML IJ SOLN
1.0000 mg | INTRAMUSCULAR | Status: DC | PRN
  Administered 2016-03-01: 2 mg via INTRAVENOUS

## 2016-03-01 MED ORDER — EPHEDRINE SULFATE 50 MG/ML IJ SOLN
INTRAMUSCULAR | Status: DC | PRN
  Administered 2016-03-01 (×2): 10 mg via INTRAVENOUS

## 2016-03-01 MED ORDER — BUPIVACAINE HCL (PF) 0.5 % IJ SOLN
INTRAMUSCULAR | Status: DC | PRN
  Administered 2016-03-01: 25 mL

## 2016-03-01 MED ORDER — LACTATED RINGERS IV SOLN
INTRAVENOUS | Status: DC
  Administered 2016-03-01 (×2): via INTRAVENOUS

## 2016-03-01 MED ORDER — ONDANSETRON HCL 4 MG/2ML IJ SOLN
INTRAMUSCULAR | Status: DC | PRN
  Administered 2016-03-01: 4 mg via INTRAVENOUS

## 2016-03-01 MED ORDER — CHLORHEXIDINE GLUCONATE 4 % EX LIQD: 60.0000 mL | Freq: Once | CUTANEOUS | Status: DC

## 2016-03-01 MED ORDER — SCOPOLAMINE 1 MG/3DAYS TD PT72: 1.0000 | MEDICATED_PATCH | Freq: Once | TRANSDERMAL | Status: DC | PRN

## 2016-03-01 SURGICAL SUPPLY — 84 items
ANCHOR CORKSCREW BIO 5.5 FT (Anchor) ×4 IMPLANT
BENZOIN TINCTURE PRP APPL 2/3 (GAUZE/BANDAGES/DRESSINGS) ×4 IMPLANT
BLADE 4.2CUDA (BLADE) ×4 IMPLANT
BLADE AVERAGE 25MMX9MM (BLADE)
BLADE AVERAGE 25X9 (BLADE) IMPLANT
BLADE CUTTER GATOR 3.5 (BLADE) IMPLANT
BLADE SURG 15 STRL LF DISP TIS (BLADE) ×2 IMPLANT
BLADE SURG 15 STRL SS (BLADE) ×2
BLADE VORTEX 6.0 (BLADE) IMPLANT
BUR 3.5 LG SPHERICAL (BURR) IMPLANT
BUR EGG 3PK/BX (BURR) ×4 IMPLANT
BUR OVAL 4.0 (BURR) IMPLANT
BUR OVAL 6.0 (BURR) ×4 IMPLANT
BUR VERTEX HOODED 4.5 (BURR) IMPLANT
BURR 3.5 LG SPHERICAL (BURR)
BURR 3.5MM LG SPHERICAL (BURR)
CANNULA SHOULDER 7CM (CANNULA) ×4 IMPLANT
CANNULA TWIST IN 8.25X7CM (CANNULA) IMPLANT
CLEANER CAUTERY TIP 5X5 PAD (MISCELLANEOUS) IMPLANT
CLOSURE WOUND 1/2 X4 (GAUZE/BANDAGES/DRESSINGS) ×1
CUTTER MENISCUS  4.2MM (BLADE)
CUTTER MENISCUS 4.2MM (BLADE) IMPLANT
DECANTER SPIKE VIAL GLASS SM (MISCELLANEOUS) IMPLANT
DRAPE STERI 35X30 U-POUCH (DRAPES) ×4 IMPLANT
DRAPE SURG 17X23 STRL (DRAPES) ×4 IMPLANT
DRAPE U-SHAPE 76X120 STRL (DRAPES) ×8 IMPLANT
DRSG EMULSION OIL 3X3 NADH (GAUZE/BANDAGES/DRESSINGS) ×4 IMPLANT
DRSG PAD ABDOMINAL 8X10 ST (GAUZE/BANDAGES/DRESSINGS) ×4 IMPLANT
DURAPREP 26ML APPLICATOR (WOUND CARE) ×4 IMPLANT
ELECT REM PT RETURN 9FT ADLT (ELECTROSURGICAL) ×4
ELECTRODE REM PT RTRN 9FT ADLT (ELECTROSURGICAL) ×2 IMPLANT
GAUZE SPONGE 4X4 12PLY STRL (GAUZE/BANDAGES/DRESSINGS) ×4 IMPLANT
GLOVE BIO SURGEON STRL SZ7.5 (GLOVE) ×4 IMPLANT
GLOVE BIOGEL PI IND STRL 7.0 (GLOVE) ×4 IMPLANT
GLOVE BIOGEL PI IND STRL 8 (GLOVE) ×4 IMPLANT
GLOVE BIOGEL PI INDICATOR 7.0 (GLOVE) ×4
GLOVE BIOGEL PI INDICATOR 8 (GLOVE) ×4
GLOVE ECLIPSE 6.5 STRL STRAW (GLOVE) ×4 IMPLANT
GLOVE SURG ORTHO 8.0 STRL STRW (GLOVE) ×4 IMPLANT
GOWN STRL REUS W/ TWL LRG LVL3 (GOWN DISPOSABLE) ×2 IMPLANT
GOWN STRL REUS W/ TWL XL LVL3 (GOWN DISPOSABLE) ×2 IMPLANT
GOWN STRL REUS W/TWL LRG LVL3 (GOWN DISPOSABLE) ×2
GOWN STRL REUS W/TWL XL LVL3 (GOWN DISPOSABLE) ×6 IMPLANT
MANIFOLD NEPTUNE II (INSTRUMENTS) ×4 IMPLANT
NEEDLE 1/2 CIR CATGUT .05X1.09 (NEEDLE) ×4 IMPLANT
NEEDLE SCORPION MULTI FIRE (NEEDLE) ×4 IMPLANT
NS IRRIG 1000ML POUR BTL (IV SOLUTION) ×4 IMPLANT
PACK ARTHROSCOPY DSU (CUSTOM PROCEDURE TRAY) ×4 IMPLANT
PACK BASIN DAY SURGERY FS (CUSTOM PROCEDURE TRAY) ×4 IMPLANT
PAD CLEANER CAUTERY TIP 5X5 (MISCELLANEOUS)
PAD ORTHO SHOULDER 7X19 LRG (SOFTGOODS) IMPLANT
PENCIL BUTTON HOLSTER BLD 10FT (ELECTRODE) ×4 IMPLANT
PROBE BIPOLAR ATHRO 135MM 90D (MISCELLANEOUS) IMPLANT
RESTRAINT HEAD UNIVERSAL NS (MISCELLANEOUS) ×4 IMPLANT
SET ARTHROSCOPY TUBING (MISCELLANEOUS) ×2
SET ARTHROSCOPY TUBING LN (MISCELLANEOUS) ×2 IMPLANT
SLEEVE SCD COMPRESS KNEE MED (MISCELLANEOUS) ×4 IMPLANT
SLING ARM FOAM STRAP LRG (SOFTGOODS) IMPLANT
SLING ARM MED ADULT FOAM STRAP (SOFTGOODS) IMPLANT
SLING ULTRA II MEDIUM (SOFTGOODS) ×4 IMPLANT
SLING ULTRA II SMALL (SOFTGOODS) IMPLANT
SPONGE LAP 4X18 X RAY DECT (DISPOSABLE) ×4 IMPLANT
STAPLER VISISTAT 35W (STAPLE) IMPLANT
STRIP CLOSURE SKIN 1/2X4 (GAUZE/BANDAGES/DRESSINGS) ×3 IMPLANT
SUCTION FRAZIER HANDLE 10FR (MISCELLANEOUS)
SUCTION TUBE FRAZIER 10FR DISP (MISCELLANEOUS) IMPLANT
SUT BONE WAX W31G (SUTURE) ×4 IMPLANT
SUT ETHILON 3 0 PS 1 (SUTURE) ×4 IMPLANT
SUT FIBERWIRE #2 38 T-5 BLUE (SUTURE) ×4
SUT MNCRL AB 3-0 PS2 18 (SUTURE) ×4 IMPLANT
SUT TICRON 1 T 12 (SUTURE) IMPLANT
SUT TIGER TAPE 7 IN WHITE (SUTURE) ×4 IMPLANT
SUT VIC AB 0 CT1 27 (SUTURE)
SUT VIC AB 0 CT1 27XBRD ANBCTR (SUTURE) IMPLANT
SUT VIC AB 1 CT1 27 (SUTURE)
SUT VIC AB 1 CT1 27XBRD ANBCTR (SUTURE) IMPLANT
SUT VIC AB 2-0 SH 27 (SUTURE) ×2
SUT VIC AB 2-0 SH 27XBRD (SUTURE) ×2 IMPLANT
SUTURE FIBERWR #2 38 T-5 BLUE (SUTURE) ×2 IMPLANT
TAPE FIBER 2MM 7IN #2 BLUE (SUTURE) IMPLANT
TOWEL OR 17X24 6PK STRL BLUE (TOWEL DISPOSABLE) ×4 IMPLANT
TOWEL OR NON WOVEN STRL DISP B (DISPOSABLE) ×4 IMPLANT
WATER STERILE IRR 1000ML POUR (IV SOLUTION) ×4 IMPLANT
YANKAUER SUCT BULB TIP NO VENT (SUCTIONS) ×4 IMPLANT

## 2016-03-01 NOTE — Brief Op Note (Signed)
03/01/2016  1:54 PM  PATIENT:  Tiffany Hale  60 y.o. female  PRE-OPERATIVE DIAGNOSIS:  left shoulder OA and rotator cuff rupture  POST-OPERATIVE DIAGNOSIS:  left shoulder OA and rotator cuff rupture  PROCEDURE:  Procedure(s) with comments: SHOULDER ARTHROSCOPY WITH SUBACROMIAL DECOMPRESSION and debridement with acromioplasty,OPEN ROTATOR CUFF REPAIR AND DISTAL CLAVICULECTOMY (Left) - Pre/Post Op scalene block SHOULDER ACROMIOPLASTY (Left)  SURGEON:  Surgeon(s) and Role:    * Earlie Server, MD - Primary  PHYSICIAN ASSISTANT: Chriss Czar, PA-C  ASSISTANTS:    ANESTHESIA:   regional and general  EBL:  Total I/O In: 1000 [I.V.:1000] Out: 50 [Blood:50]  BLOOD ADMINISTERED:none  DRAINS: none   LOCAL MEDICATIONS USED:  NONE  SPECIMEN:  No Specimen  DISPOSITION OF SPECIMEN:  N/A  COUNTS:  YES  TOURNIQUET:  * No tourniquets in log *  DICTATION: .Other Dictation: Dictation Number unknown  PLAN OF CARE: Discharge to home after PACU  PATIENT DISPOSITION:  PACU - hemodynamically stable.   Delay start of Pharmacological VTE agent (>24hrs) due to surgical blood loss or risk of bleeding: not applicable

## 2016-03-01 NOTE — Discharge Instructions (Signed)
Post Anesthesia Home Care Instructions  Activity: Get plenty of rest for the remainder of the day. A responsible adult should stay with you for 24 hours following the procedure.  For the next 24 hours, DO NOT: -Drive a car -Paediatric nurse -Drink alcoholic beverages -Take any medication unless instructed by your physician -Make any legal decisions or sign important papers.  Meals: Start with liquid foods such as gelatin or soup. Progress to regular foods as tolerated. Avoid greasy, spicy, heavy foods. If nausea and/or vomiting occur, drink only clear liquids until the nausea and/or vomiting subsides. Call your physician if vomiting continues.  Special Instructions/Symptoms: Your throat may feel dry or sore from the anesthesia or the breathing tube placed in your throat during surgery. If this causes discomfort, gargle with warm salt water. The discomfort should disappear within 24 hours.  If you had a scopolamine patch placed behind your ear for the management of post- operative nausea and/or vomiting:  1. The medication in the patch is effective for 72 hours, after which it should be removed.  Wrap patch in a tissue and discard in the trash. Wash hands thoroughly with soap and water. 2. You may remove the patch earlier than 72 hours if you experience unpleasant side effects which may include dry mouth, dizziness or visual disturbances. 3. Avoid touching the patch. Wash your hands with soap and water after contact with the patch.     Regional Anesthesia Blocks  1. Numbness or the inability to move the "blocked" extremity may last from 3-48 hours after placement. The length of time depends on the medication injected and your individual response to the medication. If the numbness is not going away after 48 hours, call your surgeon.  2. The extremity that is blocked will need to be protected until the numbness is gone and the  Strength has returned. Because you cannot feel it, you will  need to take extra care to avoid injury. Because it may be weak, you may have difficulty moving it or using it. You may not know what position it is in without looking at it while the block is in effect.  3. For blocks in the legs and feet, returning to weight bearing and walking needs to be done carefully. You will need to wait until the numbness is entirely gone and the strength has returned. You should be able to move your leg and foot normally before you try and bear weight or walk. You will need someone to be with you when you first try to ensure you do not fall and possibly risk injury.  4. Bruising and tenderness at the needle site are common side effects and will resolve in a few days.  5. Persistent numbness or new problems with movement should be communicated to the surgeon or the [place] [phone] [place] [phone]).    Diet: As you were doing prior to hospitalization   Activity: Increase activity slowly as tolerated  No lifting or driving for 6 weeks   Shower: May shower without a dressing on post op day #3, NO SOAKING in tub   Dressing: You may change your dressing on post op day #3.  Then change the dressing daily with sterile 4"x4"s gauze dressing   Weight Bearing: nonweight bearing left arm.  Remain in sling and pillow except for shower and pendulum exercises.  To prevent constipation: you may use a stool softener such as -  Colace ( over the counter) 100  mg by mouth twice a day  Drink plenty of fluids ( prune juice may be helpful) and high fiber foods  Miralax ( over the counter) for constipation as needed.   Precautions: If you experience chest pain or shortness of breath - call 911 immediately For transfer to the hospital emergency department!!  If you develop a fever greater that 101 F, purulent drainage from wound, increased redness or drainage from wound, or calf pain -- Call the office   Follow- Up Appointment: Please  call for an appointment to be seen in 1 week  [place] - [phone]

## 2016-03-01 NOTE — Anesthesia Procedure Notes (Signed)
Anesthesia Regional Block:  Interscalene brachial plexus block  Pre-Anesthetic Checklist: ,, timeout performed, Correct Patient, Correct Site, Correct Laterality, Correct Procedure, Correct Position, site marked, Risks and benefits discussed,  Surgical consent,  Pre-op evaluation,  At surgeon's request and post-op pain management  Laterality: Left  Prep: chloraprep       Needles:  Injection technique: Single-shot  Needle Type: Stimulator Needle - 40     Needle Length: 4cm 4 cm Needle Gauge: 22 and 22 G    Additional Needles:  Procedures: ultrasound guided (picture in chart) Interscalene brachial plexus block Narrative:  Injection made incrementally with aspirations every 5 mL. Anesthesiologist: Nolon Nations  Additional Notes: BP cuff, EKG monitors applied. Sedation begun. Nerve location verified with U/S. Anesthetic injected incrementally, slowly , and after neg aspirations under direct u/s guidance. Good perineural spread. Tolerated well.

## 2016-03-01 NOTE — Anesthesia Postprocedure Evaluation (Signed)
Anesthesia Post Note  Patient: ALAJIA TAIT  Procedure(s) Performed: Procedure(s) (LRB): SHOULDER ARTHROSCOPY WITH SUBACROMIAL DECOMPRESSION and debridement with acromioplasty,OPEN ROTATOR CUFF REPAIR AND DISTAL CLAVICULECTOMY (Left) SHOULDER ACROMIOPLASTY (Left)  Patient location during evaluation: PACU Anesthesia Type: General and Regional Level of consciousness: sedated and patient cooperative Pain management: pain level controlled Vital Signs Assessment: post-procedure vital signs reviewed and stable Respiratory status: spontaneous breathing Cardiovascular status: stable Anesthetic complications: no    Last Vitals:  Vitals:   03/01/16 1430 03/01/16 1457  BP: 135/67 (!) 144/67  Pulse: 81 83  Resp: 14 16  Temp:  36.6 C    Last Pain:  Vitals:   03/01/16 1457  TempSrc: Oral  PainSc:                  Nolon Nations

## 2016-03-01 NOTE — Progress Notes (Signed)
Assisted Dr. Germeroth with left, ultrasound guided, interscalene  block. Side rails up, monitors on throughout procedure. See vital signs in flow sheet. Tolerated Procedure well. 

## 2016-03-01 NOTE — Anesthesia Preprocedure Evaluation (Addendum)
Anesthesia Evaluation  Patient identified by MRN, date of birth, ID band Patient awake    Reviewed: Allergy & Precautions, H&P , NPO status , Patient's Chart, lab work & pertinent test results  Airway Mallampati: II  TM Distance: >3 FB     Dental  (+) Edentulous Upper, Edentulous Lower   Pulmonary asthma , former smoker,    breath sounds clear to auscultation       Cardiovascular negative cardio ROS   Rhythm:Regular Rate:Normal     Neuro/Psych PSYCHIATRIC DISORDERS Depression    GI/Hepatic GERD  ,(+) Cirrhosis   Esophageal Varices    , Hepatitis -, A, C  Endo/Other    Renal/GU      Musculoskeletal  (+) Arthritis ,   Abdominal   Peds  Hematology   Anesthesia Other Findings   Reproductive/Obstetrics                             Anesthesia Physical  Anesthesia Plan  ASA: III  Anesthesia Plan: General and Regional   Post-op Pain Management: GA combined w/ Regional for post-op pain   Induction: Intravenous  Airway Management Planned: Oral ETT  Additional Equipment:   Intra-op Plan:   Post-operative Plan: Extubation in OR  Informed Consent: I have reviewed the patients History and Physical, chart, labs and discussed the procedure including the risks, benefits and alternatives for the proposed anesthesia with the patient or authorized representative who has indicated his/her understanding and acceptance.   Dental advisory given  Plan Discussed with: CRNA  Anesthesia Plan Comments:        Anesthesia Quick Evaluation

## 2016-03-01 NOTE — Transfer of Care (Signed)
Immediate Anesthesia Transfer of Care Note  Patient: Tiffany Hale  Procedure(s) Performed: Procedure(s) with comments: SHOULDER ARTHROSCOPY WITH SUBACROMIAL DECOMPRESSION and debridement with acromioplasty,OPEN ROTATOR CUFF REPAIR AND DISTAL CLAVICULECTOMY (Left) - Pre/Post Op scalene block SHOULDER ACROMIOPLASTY (Left)  Patient Location: PACU  Anesthesia Type:General and GA combined with regional for post-op pain  Level of Consciousness: awake, alert  and oriented  Airway & Oxygen Therapy: Patient Spontanous Breathing and Patient connected to face mask oxygen  Post-op Assessment: Report given to RN and Post -op Vital signs reviewed and stable  Post vital signs: Reviewed and stable  Last Vitals:  Vitals:   03/01/16 1215 03/01/16 1230  BP: 126/61 133/73  Pulse: (!) 59 62  Resp: 10 10  Temp:      Last Pain:  Vitals:   03/01/16 1107  TempSrc: Oral  PainSc: 5       Patients Stated Pain Goal: 3 ([phone] [id number])  Complications: No apparent anesthesia complications

## 2016-03-01 NOTE — H&P (View-Only) (Signed)
Tiffany Hale is an 60 y.o. female.   Chief Complaint: Follow-up left shoulder rotator cuff tear. HPI: The patient was seen in the office last month. MRI diagnosed partial supraspinatus tear, Type II acromion. We had elected conservative treatment at the time. Corticosteroid injection did give her some temporary relief but now symptoms are returning and she is here to discuss potential surgical option.  Past Medical History:  Diagnosis Date  . Anxiety   . Arthritis    OA left shoulder  . Asthma   . BMI (body mass index) 20.0-29.9 2010 155 LBS  . Chronic back pain   . Cirrhosis ([place]) NOV 2011   MRI LIVER-5 SML DYSPLASTIC NODULES; 2009: TBILI 0.7 ALK PHOS 128 AST 56 ALT 53 ALB 3.8 INR 1.3  . 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 GERD (gastroesophageal reflux disease)   . HBV (hepatitis B virus) infection 2009: HBsAb POS  . HCV (hepatitis C virus) 2009   COMPLETED RX 2015-OCT 2015 VL UNDETECTABLE  . Hepatitis A 2009 Ab POS  . Tobacco abuse     Past Surgical History:  Procedure Laterality Date  . BIOPSY N/A 07/21/2014   Procedure: BIOPSY;  Surgeon: Danie Binder, MD;  Location: [facility];  Service: Endoscopy;  Laterality: N/A;  Gastric  . COLONOSCOPY  OCT 2011    6 MM SIMPLE ADENOMA, BENIGN COLONIC MUCOSA  . ESOPHAGOGASTRODUODENOSCOPY  MAR 2011   Grade 1 varices, GASTRITIS  . ESOPHAGOGASTRODUODENOSCOPY (EGD) WITH PROPOFOL N/A 07/09/2012   SLF:The mucosa of the esophagus appeared normal/Small hiatal hernia/MODERATE Non-erosive gastritis (inflammation) with hemorrhage was found in the gastric antrum; multiple biopsies (reactive gastropathy). No H.pylori. No varices.Next EGD 07/2014.  Marland Kitchen ESOPHAGOGASTRODUODENOSCOPY (EGD) WITH PROPOFOL N/A 07/21/2014   SLF: 1. No esophageal , gastric, or duodanl varices seen. 2. Mild non-erosive gastritis.  Marland Kitchen ESOPHAGOGASTRODUODENOSCOPY (EGD) WITH PROPOFOL N/A 11/16/2015   Procedure: ESOPHAGOGASTRODUODENOSCOPY (EGD) WITH PROPOFOL;  Surgeon:  Danie Binder, MD;  [facility];  Service: Endoscopy;  Laterality: N/A;  815  . Extremity Surgery     and Pelvic, after fall  . FOOT FRACTURE SURGERY    . FOOT SURGERY Right 2009   [facility]-crushed foot with fall and it was repaired  . SAVORY DILATION N/A 11/16/2015   Procedure: SAVORY DILATION;  Surgeon: Danie Binder, MD;  [facility];  Service: Endoscopy;  Laterality: N/A;  . TOTAL ABDOMINAL HYSTERECTOMY  1995 DUB    Family History  Problem Relation Age of Onset  . Birth defects Brother   . Cancer Maternal Grandmother   . Cancer Maternal Grandfather   . Colon cancer Neg Hx   . Colon polyps Neg Hx    Social History:  reports that she quit smoking about 21 months ago. Her smoking use included Cigarettes. She has a 20.00 pack-year smoking history. She has never used smokeless tobacco. She reports that she does not drink alcohol or use drugs.  Allergies: No Known Allergies   (Not in a hospital admission)  Results for orders placed or performed during the hospital encounter of 02/28/16 (from the past 48 hour(s))  Comprehensive metabolic panel     Status: Abnormal   Collection Time: 02/28/16 10:12 AM  Result Value Ref Range   Sodium 134 (L) 135 - 145 mmol/L   Potassium 3.8 3.5 - 5.1 mmol/L   Chloride 99 (L) 101 - 111 mmol/L   CO2 27 22 - 32 mmol/L   Glucose, Bld 114 (H)  65 - 99 mg/dL   BUN 11 6 - 20 mg/dL   Creatinine, Ser 0.55 0.44 - 1.00 mg/dL   Calcium 9.2 8.9 - 10.3 mg/dL   Total Protein 8.0 6.5 - 8.1 g/dL   Albumin 3.7 3.5 - 5.0 g/dL   AST 18 15 - 41 U/L   ALT 15 14 - 54 U/L   Alkaline Phosphatase 66 38 - 126 U/L   Total Bilirubin 0.8 0.3 - 1.2 mg/dL   GFR calc non Af Amer >60 >60 mL/min   GFR calc Af Amer >60 >60 mL/min    Comment: (NOTE) The eGFR has been calculated using the CKD EPI equation. This calculation has not been validated in all clinical situations. eGFR's persistently <60 mL/min signify possible Chronic Kidney Disease.    Anion  gap 8 5 - 15   No results found.  Review of Systems  Respiratory: Positive for shortness of breath.   Musculoskeletal: Positive for joint pain.  Skin: Positive for rash.  Endo/Heme/Allergies: Bruises/bleeds easily.  Psychiatric/Behavioral: Positive for depression.  All other systems reviewed and are negative.   There were no vitals taken for this visit. Physical Exam  Constitutional: She is oriented to person, place, and time. She appears well-developed and well-nourished. No distress.  HENT:  Head: Normocephalic and atraumatic.  Nose: Nose normal.  Eyes: Conjunctivae and EOM are normal. Pupils are equal, round, and reactive to light.  Neck: Normal range of motion. Neck supple.  Cardiovascular: Normal rate and intact distal pulses.   Respiratory: Effort normal. No respiratory distress.  GI: Soft. She exhibits no distension. There is no tenderness.  Musculoskeletal:       Left shoulder: She exhibits decreased range of motion, tenderness, pain and decreased strength.  Lymphadenopathy:    She has no cervical adenopathy.  Neurological: She is alert and oriented to person, place, and time.  Skin: Skin is warm and dry. No erythema.  Psychiatric: She has a normal mood and affect. Her behavior is normal.     Assessment/Plan Discussed risks and benefits of outpatient left shoulder arthroscopy, debridement, arthroscopic versus open rotator cuff repair likely; possibly distal clavicle and acromioplasty.  She would like to proceed. This will be set up as soon as scheduling allows.   Chriss Czar, PA-C 02/28/2016, 4:43 PM

## 2016-03-01 NOTE — Anesthesia Procedure Notes (Signed)
Procedure Name: Intubation Date/Time: 03/01/2016 12:44 PM Performed by: Maryella Shivers Pre-anesthesia Checklist: Patient identified, Emergency Drugs available, Suction available and Patient being monitored Patient Re-evaluated:Patient Re-evaluated prior to inductionOxygen Delivery Method: Circle system utilized Preoxygenation: Pre-oxygenation with 100% oxygen Intubation Type: IV induction Ventilation: Mask ventilation without difficulty Laryngoscope Size: Mac and 3 Grade View: Grade II Tube type: Oral Tube size: 7.0 mm Number of attempts: 1 Airway Equipment and Method: Stylet and Oral airway Placement Confirmation: ETT inserted through vocal cords under direct vision,  positive ETCO2 and breath sounds checked- equal and bilateral Secured at: 20 cm Tube secured with: Tape Dental Injury: Teeth and Oropharynx as per pre-operative assessment  Comments:

## 2016-03-01 NOTE — Interval H&P Note (Signed)
History and Physical Interval Note:  03/01/2016 12:22 PM  Tiffany Hale  has presented today for surgery, with the diagnosis of left shoulder OA and rotator cuff rupture  The various methods of treatment have been discussed with the patient and family. After consideration of risks, benefits and other options for treatment, the patient has consented to  Procedure(s) with comments: SHOULDER ARTHROSCOPY WITH SUBACROMIAL DECOMPRESSION and debridement with acromioplasty, ROTATOR CUFF REPAIR AND possible claviclectomy versus open (Left) - Pre/Post Op scalene block as a surgical intervention .  The patient's history has been reviewed, patient examined, no change in status, stable for surgery.  I have reviewed the patient's chart and labs.  Questions were answered to the patient's satisfaction.     Joel Cowin JR,W D

## 2016-03-06 ENCOUNTER — Ambulatory Visit: Payer: Medicaid Other | Admitting: Physical Therapy

## 2016-03-06 ENCOUNTER — Encounter (HOSPITAL_BASED_OUTPATIENT_CLINIC_OR_DEPARTMENT_OTHER): Payer: Self-pay | Admitting: Orthopedic Surgery

## 2016-03-06 NOTE — Op Note (Signed)
NAMELEILA, Hale NO.:  [id number]  MEDICAL RECORD NO.:  [id number]  LOCATION:                                 FACILITY:  PHYSICIAN:  Lockie Pares, M.D.    DATE OF BIRTH:  March 30, 1956  DATE OF PROCEDURE:  03/01/2016 DATE OF DISCHARGE:                              OPERATIVE REPORT   PREOPERATIVE DIAGNOSES: 1. Severe retracted tear, supraspinatus, infraspinatus. 2. Tearing anterior superior labrum. 3. Impingement. 4. Acromioclavicular joint arthritis.  POSTOPERATIVE DIAGNOSES: 1. Severe retracted tear, supraspinatus, infraspinatus. 2. Tearing anterior superior labrum. 3. Impingement. 4. Acromioclavicular joint arthritis.  OPERATION: 1. Open rotator cuff repair acromioplasty (chronic). 2. Open distal clavicle excision. 3. Arthroscopic debridement, torn labrum.  SURGEON:  Lockie Pares, M.D.  ASSISTANT:  Chriss Czar, PA-C.  ANESTHESIA:  General with nerve block.  BLOOD LOSS:  Minimal.  DESCRIPTION OF PROCEDURE:  Beach chair positioning.  Arthroscopic portals created anteriorly and posteriorly.  Systematic inspection of the shoulder showed the patient to have some very mild glenohumeral degenerative change with significant tearing into superior posterior labrum debrided.  Biceps tendon and anchor intact.  Subscapularis intact.  Complete severely retracted tears, infraspinatus supraspinatus __________ portion of the supraspinatus, the tissue was a little more healthy-looking than the posterior, it was a large V-shaped defect, very retracted, felt at that point to be difficult to get anything close to an anatomic repair.  I elected at that point after debridement __________ bisecting the acromial AC interval, very hypertrophic arthritic clavicle was excised about 0.5 cm.  Very thickened type 3 acromion, acromioplasty was carried out.  Bursectomy carried out.  With extensive mobilization of the cuff, we were able to over-sew the V- shaped defect  with a running #1 Tycron.  We could not get this back anatomically; however, once we converged the 2 edges of the tear, we placed one 5.5 Bio-Corkscrew Arthrex anchor for the double arm suture to imbricate the anterior and posterior leaflets.  Again anatomic repair was not possible, but given the tissue quality, etc., I thought that we did have a reasonably good repair.  Wound was irrigated.  Closure of the deltoid with #1 FiberWire, subcu tissue with 2-0 Vicryl, skin with Monocryl.  Lightly compressive sterile dressing, mini pillow splint applied, taken to the recovery room in a stable condition.     Lockie Pares, M.D.   ______________________________ Lockie Pares, M.D.    WDC/MEDQ  D:  03/01/2016  T:  03/01/2016  Job:  [id number]

## 2016-04-12 ENCOUNTER — Encounter (HOSPITAL_COMMUNITY): Payer: Self-pay

## 2016-04-12 ENCOUNTER — Ambulatory Visit (HOSPITAL_COMMUNITY): Payer: Medicaid Other | Attending: Orthopedic Surgery

## 2016-04-12 ENCOUNTER — Telehealth (HOSPITAL_COMMUNITY): Payer: Self-pay

## 2016-04-12 DIAGNOSIS — M25512 Pain in left shoulder: Secondary | ICD-10-CM | POA: Insufficient documentation

## 2016-04-12 DIAGNOSIS — R29898 Other symptoms and signs involving the musculoskeletal system: Secondary | ICD-10-CM | POA: Diagnosis present

## 2016-04-12 DIAGNOSIS — M25612 Stiffness of left shoulder, not elsewhere classified: Secondary | ICD-10-CM | POA: Insufficient documentation

## 2016-04-12 NOTE — Patient Instructions (Signed)
Perform each exercise ____10-15____ reps. 2-3x days.   Protraction - STANDING  Start by holding a wand or cane at chest height.  Next, slowly push the wand outwards in front of your body so that your elbows become fully straightened. Then, return to the original position.     Shoulder FLEXION - STANDING - PALMS Down  In the standing position, hold a wand/cane with both arms, palms up on both sides. Raise up the wand/cane allowing your unaffected arm to perform most of the effort. Your affected arm should be partially relaxed.      Internal/External ROTATION - STANDING  In the standing position, hold a wand/cane with both hands keeping your elbows bent. Move your arms and wand/cane to one side.  Your affected arm should be partially relaxed while your unaffected arm performs most of the effort.       Shoulder ABDUCTION - STANDING  While holding a wand/cane palm face up on the injured side and palm face down on the uninjured side, slowly raise up your injured arm to the side.                     Horizontal Abduction/Adduction      Straight arms holding cane at shoulder height, bring cane to right, center, left. Repeat starting to left.   Copyright  V[employer]. All rights reserved.

## 2016-04-12 NOTE — Telephone Encounter (Signed)
Called pt and let her know not to come on 04/20/15 per LE

## 2016-04-12 NOTE — Therapy (Signed)
[place], [place], [address] Phone: [phone]   Fax:  [phone]  Occupational Therapy Evaluation  Patient Details  Name: Tiffany Hale MRN: [id number] Date of Birth: 12-15-1955 Referring Provider: Earlie Server, MD  Encounter Date: 04/12/2016      OT End of Session - 04/12/16 1053    Visit Number 1   Number of Visits 4   Date for OT Re-Evaluation 06/11/16   Authorization Type Medicaid   Authorization Time Period Requesting 3 visits (sent in on 04/12/16)    Authorization - Number of Visits 3   OT Start Time 0915  Pt arrived late   OT Stop Time 0948   OT Time Calculation (min) 33 min   Activity Tolerance Patient tolerated treatment well   Behavior During Therapy [facility] for tasks assessed/performed      Past Medical History:  Diagnosis Date  . Anxiety   . Arthritis    OA left shoulder  . Asthma   . BMI (body mass index) 20.0-29.9 2010 155 LBS  . Chronic back pain   . Cirrhosis ([place]) NOV 2011   MRI LIVER-5 SML DYSPLASTIC NODULES; 2009: TBILI 0.7 ALK PHOS 128 AST 56 ALT 53 ALB 3.8 INR 1.3  . 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 GERD (gastroesophageal reflux disease)   . HBV (hepatitis B virus) infection 2009: HBsAb POS  . HCV (hepatitis C virus) 2009   COMPLETED RX 2015-OCT 2015 VL UNDETECTABLE  . Hepatitis A 2009 Ab POS  . Tobacco abuse     Past Surgical History:  Procedure Laterality Date  . BIOPSY N/A 07/21/2014   Procedure: BIOPSY;  Surgeon: Danie Binder, MD;  Location: [facility];  Service: Endoscopy;  Laterality: N/A;  Gastric  . COLONOSCOPY  OCT 2011    6 MM SIMPLE ADENOMA, BENIGN COLONIC MUCOSA  . ESOPHAGOGASTRODUODENOSCOPY  MAR 2011   Grade 1 varices, GASTRITIS  . ESOPHAGOGASTRODUODENOSCOPY (EGD) WITH PROPOFOL N/A 07/09/2012   SLF:The mucosa of the esophagus appeared normal/Small hiatal hernia/MODERATE Non-erosive gastritis (inflammation) with hemorrhage was found in the  gastric antrum; multiple biopsies (reactive gastropathy). No H.pylori. No varices.Next EGD 07/2014.  Marland Kitchen ESOPHAGOGASTRODUODENOSCOPY (EGD) WITH PROPOFOL N/A 07/21/2014   SLF: 1. No esophageal , gastric, or duodanl varices seen. 2. Mild non-erosive gastritis.  Marland Kitchen ESOPHAGOGASTRODUODENOSCOPY (EGD) WITH PROPOFOL N/A 11/16/2015   Procedure: ESOPHAGOGASTRODUODENOSCOPY (EGD) WITH PROPOFOL;  Surgeon: Danie Binder, MD;  Location: [facility];  Service: Endoscopy;  Laterality: N/A;  815  . Extremity Surgery     and Pelvic, after fall  . FOOT FRACTURE SURGERY    . FOOT SURGERY Right 2009   [facility]-crushed foot with fall and it was repaired  . SAVORY DILATION N/A 11/16/2015   Procedure: SAVORY DILATION;  Surgeon: Danie Binder, MD;  Location: [facility];  Service: Endoscopy;  Laterality: N/A;  . SHOULDER ACROMIOPLASTY Left 03/01/2016   Procedure: SHOULDER ACROMIOPLASTY;  Surgeon: Earlie Server, MD;  Location: [facility];  Service: Orthopedics;  Laterality: Left;  . SHOULDER ARTHROSCOPY WITH OPEN ROTATOR CUFF REPAIR AND DISTAL CLAVICLE ACROMINECTOMY Left 03/01/2016   Procedure: SHOULDER ARTHROSCOPY WITH SUBACROMIAL DECOMPRESSION and debridement with acromioplasty,OPEN ROTATOR CUFF REPAIR AND DISTAL CLAVICULECTOMY;  Surgeon: Earlie Server, MD;  Location: [facility];  Service: Orthopedics;  Laterality: Left;  Pre/Post Op scalene block  . TOTAL ABDOMINAL HYSTERECTOMY  1995 DUB    There were no vitals filed for this visit.  Subjective Assessment - 04/12/16 0918    Subjective  S: I've been doing exercises at home where I swing my arm in circles.    Pertinent History Patient is a 61 y/o female S/P left rotator cuff repair which was performed on 03/01/16. Patient presents today with abduction pillow sling. Dr. French Ana has referred patient to occupational therapy for evaluation and treatment. Pt is to follow standard RTR protocol.   Patient Stated Goals To return to using Left arm  as normal as possible.   Currently in Pain? Yes   Pain Score 4    Pain Location Shoulder   Pain Orientation Left   Pain Descriptors / Indicators Aching   Pain Type Surgical pain   Pain Onset 1 to 4 weeks ago   Pain Frequency Constant   Aggravating Factors  movement   Pain Relieving Factors pain meds, rest   Effect of Pain on Daily Activities Patient is unable to complete any daily tasks with LUE           [facility] OT Assessment - 04/12/16 4782      Assessment   Diagnosis Left rotator cuff repair   Referring Provider Earlie Server, MD   Onset Date 03/01/16   Assessment 04/26/15 - follow up appointment with Dr. French Ana   Prior Therapy None     Precautions   Precautions Shoulder   Type of Shoulder Precautions patient is 6 weeks post op at evaluation. Ok to begin AA/ROM. Progress to A/ROM at  8 weeks (04/26/16). Progress to strengthening at 12 weeks ( 06/07/16)   Precaution Comments pt may begin to wean from sling at this time.     Restrictions   Weight Bearing Restrictions Yes     Balance Screen   Has the patient fallen in the past 6 months No     Home  Environment   Family/patient expects to be discharged to: Private residence     Prior Function   Level of [place] Retired     ADL   ADL comments Pt is unable to complete any daily tasks with LUE at this time.     Mobility   Mobility Status Independent     Written Expression   Dominant Hand Right     Cognition   Overall Cognitive Status Within Functional Limits for tasks assessed     ROM / Strength   AROM / PROM / Strength AROM;PROM;Strength     Palpation   Palpation comment Mod fascial restriction in left upper arm, trapezius, and scapularis region.     AROM   Overall AROM  Unable to assess;Due to precautions     PROM   Overall PROM Comments Assessed seated. IR/er adducted.   PROM Assessment Site Shoulder   Right/Left Shoulder Left   Left Shoulder Flexion 125 Degrees   Left  Shoulder ABduction 85 Degrees   Left Shoulder Internal Rotation 90 Degrees   Left Shoulder External Rotation 15 Degrees     Strength   Overall Strength Unable to assess;Due to precautions                  OT Treatments/Exercises (OP) - 04/12/16 0937      Exercises   Exercises Shoulder     Shoulder Exercises: Supine   Protraction AAROM;10 reps   Horizontal ABduction AAROM;10 reps   External Rotation AAROM;10 reps   Internal Rotation AAROM;10 reps   Flexion AAROM;10 reps   ABduction AAROM;10 reps  Shoulder Exercises: Standing   Protraction AAROM;5 reps   Horizontal ABduction AAROM;5 reps   External Rotation AAROM;5 reps   Internal Rotation AAROM;5 reps   Flexion AAROM;5 reps   ABduction AAROM;5 reps               OT Education - 04/12/16 1052    Education provided Yes   Education Details AA/ROM exercises to be completed standing at home. Patient was educated to begin weaning from sling at this time and to only complete daily tasks at waist level.   Person(s) Educated Patient   Methods Explanation;Demonstration;Verbal cues;Handout   Comprehension Returned demonstration;Verbalized understanding          OT Short Term Goals - 04/12/16 1113      OT SHORT TERM GOAL #1   Title Patient will be educated and independent with HEP to increase functional use of LUE during daily tasks.    Time 3   Period Weeks   Status New     OT SHORT TERM GOAL #2   Title Patient will increase P/ROM to WNL to increase ability to get shirts on and off with less difficulty.    Time 3   Period Weeks   Status New     OT SHORT TERM GOAL #3   Title Patient will increase LUE strength to 3/5 to increase ability to complete overhead reaching tasks.   Time 3   Period Weeks   Status New           OT Long Term Goals - 04/12/16 1115      OT LONG TERM GOAL #1   Title Patient will return to highest level of independence with all daily tasks when using her LUE.    Time 6    Period Weeks   Status New     OT LONG TERM GOAL #2   Title Patient will increase A/ROM of LUE to WNL to increase ability to complete activities overhead.   Time 6   Period Weeks   Status New     OT LONG TERM GOAL #3   Title Patient will increase LUE strength to 4/5 to increase ability to complete normal lifting activities at home.   Time 6   Period Weeks   Status New     OT LONG TERM GOAL #4   Title Patient will decrease pain level when using LUE at home to 2/10 or less.   Time 6   Period Weeks   Status New     OT LONG TERM GOAL #5   Title Patient will decrease fascial restrictions to trace amount in LUE to increase functional mobility needed for reaching tasks.    Time 6   Period Weeks   Status New               Plan - 04/12/16 1108    Clinical Impression Statement A: Patient is a 61 y/o female S/P left RTC repair causing increased pain, fascial restrictions, and decreased strength and ROM resulting in difficulty completing daily tasks using her LUE.   Rehab Potential Excellent   OT Frequency Other (comment)   OT Duration 6 weeks   OT Treatment/Interventions Self-care/ADL training;Cryotherapy;Electrical Stimulation;Moist Heat;Passive range of motion;DME and/or AE instruction;Therapeutic exercises;Therapeutic activities;Ultrasound;Manual Therapy;Patient/family education   Plan P: Pt will benefit from skilled OT services to increase functional use of LUE during daily tasks. Treatment Plan: Please space our treatment session appropriately as patient will only get 3 approved sessions from Medicaid. Pt is  6 weeks post op at evaluation. Meyofascial release, manual stretching, AA/ROM, A/ROM, and then progress to general shoulder and scapular strengthening.  Update HEP at every session as appropriate.   OT Home Exercise Plan 1/3: AA/ROM exercises.   Consulted and Agree with Plan of Care Patient      Patient will benefit from skilled therapeutic intervention in order to  improve the following deficits and impairments:  Decreased strength, Pain, Decreased range of motion, Increased fascial restricitons, Impaired UE functional use  Visit Diagnosis: Acute pain of left shoulder - Plan: Ot plan of care cert/re-cert  Stiffness of left shoulder, not elsewhere classified - Plan: Ot plan of care cert/re-cert  Other symptoms and signs involving the musculoskeletal system - Plan: Ot plan of care cert/re-cert    Problem List Patient Active Problem List   Diagnosis Date Noted  . Dysphagia 11/09/2015  . IBS (irritable bowel syndrome) 05/12/2015  . Constipation 10/07/2014  . Gastritis 10/07/2014  . Bilateral foot pain 09/09/2014  . Colon adenomas 04/01/2014  . Postmenopausal atrophic vaginitis 07/18/2012  . Dyspareunia 07/18/2012  . 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 [place], [place], [address] Phone: [phone]   Fax:  [phone]  Name: ARDYS HATAWAY MRN: [id number] Date of Birth: 01/20/1956

## 2016-04-17 ENCOUNTER — Other Ambulatory Visit (HOSPITAL_COMMUNITY): Payer: Self-pay | Admitting: Nurse Practitioner

## 2016-04-17 DIAGNOSIS — R221 Localized swelling, mass and lump, neck: Secondary | ICD-10-CM

## 2016-04-19 ENCOUNTER — Encounter (HOSPITAL_COMMUNITY): Payer: Medicaid Other

## 2016-04-20 ENCOUNTER — Ambulatory Visit (HOSPITAL_COMMUNITY)
Admission: RE | Admit: 2016-04-20 | Discharge: 2016-04-20 | Disposition: A | Discharge status: Home / Self Care | Payer: Medicaid Other | Source: Ambulatory Visit | Attending: Nurse Practitioner | Admitting: Nurse Practitioner

## 2016-04-20 DIAGNOSIS — C329 Malignant neoplasm of larynx, unspecified: Secondary | ICD-10-CM | POA: Diagnosis not present

## 2016-04-20 DIAGNOSIS — R221 Localized swelling, mass and lump, neck: Secondary | ICD-10-CM | POA: Insufficient documentation

## 2016-04-20 MED ORDER — IOPAMIDOL (ISOVUE-300) INJECTION 61%
75.0000 mL | Freq: Once | INTRAVENOUS | Status: AC | PRN
  Administered 2016-04-20: 75 mL via INTRAVENOUS

## 2016-04-26 ENCOUNTER — Encounter (HOSPITAL_COMMUNITY): Payer: Self-pay

## 2016-04-26 ENCOUNTER — Ambulatory Visit (HOSPITAL_COMMUNITY): Payer: Medicaid Other

## 2016-04-27 ENCOUNTER — Ambulatory Visit (INDEPENDENT_AMBULATORY_CARE_PROVIDER_SITE_OTHER): Payer: Medicaid Other | Admitting: Otolaryngology

## 2016-05-01 ENCOUNTER — Telehealth (HOSPITAL_COMMUNITY): Payer: Self-pay | Admitting: Physician Assistant

## 2016-05-01 ENCOUNTER — Ambulatory Visit (HOSPITAL_COMMUNITY): Payer: Medicaid Other | Admitting: Specialist

## 2016-05-01 NOTE — Telephone Encounter (Signed)
05/01/16 pt called at 2:51 and said she realized she had missed her appt so I rescheduled her for 1/24

## 2016-05-02 ENCOUNTER — Encounter (HOSPITAL_COMMUNITY): Payer: Medicaid Other | Attending: Hematology & Oncology | Admitting: Hematology & Oncology

## 2016-05-02 ENCOUNTER — Encounter: Payer: Self-pay | Admitting: Dietician

## 2016-05-02 ENCOUNTER — Encounter (HOSPITAL_COMMUNITY): Payer: Self-pay | Admitting: Hematology & Oncology

## 2016-05-02 VITALS — BP 152/57 | HR 101 | Temp 99.7°F | Resp 16 | Ht 63.75 in | Wt 106.5 lb

## 2016-05-02 DIAGNOSIS — R634 Abnormal weight loss: Secondary | ICD-10-CM

## 2016-05-02 DIAGNOSIS — M542 Cervicalgia: Secondary | ICD-10-CM | POA: Diagnosis not present

## 2016-05-02 DIAGNOSIS — Z809 Family history of malignant neoplasm, unspecified: Secondary | ICD-10-CM | POA: Diagnosis not present

## 2016-05-02 DIAGNOSIS — Z87891 Personal history of nicotine dependence: Secondary | ICD-10-CM | POA: Diagnosis not present

## 2016-05-02 DIAGNOSIS — C321 Malignant neoplasm of supraglottis: Secondary | ICD-10-CM | POA: Diagnosis not present

## 2016-05-02 DIAGNOSIS — C760 Malignant neoplasm of head, face and neck: Secondary | ICD-10-CM

## 2016-05-02 DIAGNOSIS — R070 Pain in throat: Secondary | ICD-10-CM | POA: Diagnosis not present

## 2016-05-02 DIAGNOSIS — G893 Neoplasm related pain (acute) (chronic): Secondary | ICD-10-CM

## 2016-05-02 MED ORDER — OXYCODONE HCL ER 15 MG PO T12A: 15.0000 mg | EXTENDED_RELEASE_TABLET | Freq: Two times a day (BID) | ORAL | 0 refills | Status: DC

## 2016-05-02 MED ORDER — OXYCODONE HCL 5 MG PO TABS: 5.0000 mg | ORAL_TABLET | ORAL | 0 refills | Status: DC | PRN

## 2016-05-02 NOTE — Progress Notes (Signed)
[place]  CONSULT NOTE  Patient Care Team: Cory Munch, PA-C as PCP - General (Physician Assistant) Danie Binder, MD (Gastroenterology)  CHIEF COMPLAINTS/PURPOSE OF CONSULTATION:  Supraglottic mass  HISTORY OF PRESENTING ILLNESS:  Tiffany Hale 61 y.o. female is here accompanied by her son [name], and grandson. 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  I personally reviewed and went over CT results with the patient.  She states the first thing she noticed was the inisde of her mouth was sore in September. She went to the dentist for it and she received a shot in her jaw which didn't help. The pain didn't get better. Pain in her neck is currently a 5. She noticed the swelling on her left side of neck in November.She is a former smoker as of 2 yrs ago.  She does not chew tobacco or drink ETOH.   She ultimately had a CT of her neck on 1-11 showing a bulky supraglottic mass and left cervical node  States trouble swallowing and is not eating well because the trouble swallowing. Lost at least 20 pounds in 6 months.   Has problems sleeping because of her neck pain and back pain. The back pain is old.   She also had surgery in her shoulder on November 22 by Dr. French Ana in [place]. States it was at an outpatient place. She was told by the anesthesiologist after her surgery to go see an ENT. She notes that she did not follow-up with ENT.   She currently takes 5 mg oxycodone for the pain but it isn't helping enough.    States she wears dentures and has no teeth at all.   Her hepatitis C was treated by Dr. Oneida Alar in 2014.    MEDICAL HISTORY:  Past Medical History:  Diagnosis Date  . Anxiety   . Arthritis    OA left shoulder  . Asthma   . BMI (body mass index) 20.0-29.9 2010 155 LBS  . Chronic back pain   . Cirrhosis ([place]) NOV 2011   MRI LIVER-5 SML DYSPLASTIC NODULES; 2009:  TBILI 0.7 ALK PHOS 128 AST 56 ALT 53 ALB 3.8 INR 1.3  . 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 GERD (gastroesophageal reflux disease)   . HBV (hepatitis B virus) infection 2009: HBsAb POS  . HCV (hepatitis C virus) 2009   COMPLETED RX 2015-OCT 2015 VL UNDETECTABLE  . Hepatitis A 2009 Ab POS  . Tobacco abuse     SURGICAL HISTORY: Past Surgical History:  Procedure Laterality Date  . BIOPSY N/A 07/21/2014   Procedure: BIOPSY;  Surgeon: Danie Binder, MD;  Location: [facility];  Service: Endoscopy;  Laterality: N/A;  Gastric  . COLONOSCOPY  OCT 2011    6 MM SIMPLE ADENOMA, BENIGN COLONIC MUCOSA  . ESOPHAGOGASTRODUODENOSCOPY  MAR 2011   Grade 1 varices, GASTRITIS  . ESOPHAGOGASTRODUODENOSCOPY (EGD) WITH PROPOFOL N/A 07/09/2012   SLF:The mucosa of the esophagus appeared normal/Small hiatal hernia/MODERATE Non-erosive gastritis (inflammation) with hemorrhage was found in the gastric antrum; multiple biopsies (reactive gastropathy). No H.pylori. No varices.Next EGD 07/2014.  Marland Kitchen ESOPHAGOGASTRODUODENOSCOPY (EGD) WITH PROPOFOL N/A 07/21/2014   SLF: 1. No esophageal , gastric, or duodanl varices seen. 2. Mild non-erosive gastritis.  Marland Kitchen ESOPHAGOGASTRODUODENOSCOPY (EGD) WITH PROPOFOL N/A 11/16/2015   Procedure: ESOPHAGOGASTRODUODENOSCOPY (EGD) WITH PROPOFOL;  Surgeon: Danie Binder, MD;  Location: [facility];  Service: Endoscopy;  Laterality: N/A;  815  . Extremity Surgery     and Pelvic, after fall  . FOOT FRACTURE SURGERY    . FOOT SURGERY Right 2009   [facility]-crushed foot with fall and it was repaired  . SAVORY DILATION N/A 11/16/2015   Procedure: SAVORY DILATION;  Surgeon: Danie Binder, MD;  Location: [facility];  Service: Endoscopy;  Laterality: N/A;  . SHOULDER ACROMIOPLASTY Left 03/01/2016   Procedure: SHOULDER ACROMIOPLASTY;  Surgeon: Earlie Server, MD;  Location: [facility];  Service: Orthopedics;  Laterality: Left;  . SHOULDER ARTHROSCOPY WITH OPEN ROTATOR CUFF  REPAIR AND DISTAL CLAVICLE ACROMINECTOMY Left 03/01/2016   Procedure: SHOULDER ARTHROSCOPY WITH SUBACROMIAL DECOMPRESSION and debridement with acromioplasty,OPEN ROTATOR CUFF REPAIR AND DISTAL CLAVICULECTOMY;  Surgeon: Earlie Server, MD;  Location: [facility];  Service: Orthopedics;  Laterality: Left;  Pre/Post Op scalene block  . TOTAL ABDOMINAL HYSTERECTOMY  1995 DUB    SOCIAL HISTORY: Social History   Social History  . Marital status: Divorced    Spouse name: N/A  . Number of children: N/A  . Years of education: N/A   Occupational History  . Not on file.   Social History Main Topics  . Smoking status: Former Smoker    Packs/day: 0.50    Years: 40.00    Types: Cigarettes    Quit date: 05/25/2014  . Smokeless tobacco: Never Used     Comment: 3 days to go through a pack   . Alcohol use No     Comment: history of ETOH abuse, no alcohol X 10 years   . Drug use: No  . Sexual activity: Not Currently   Other Topics Concern  . Not on file   Social History Narrative  . No narrative on file  Social Hx: Lives with mother and sister Doesn't smoke currently, quit 2 years ago. Has smoked for 25 years prior  Doesn't drink Doesn't currently work, but used to work in [place] Has 3 children 6 grandchildren Divorced Hobbies: playing cards, monopoly, deal  FAMILY HISTORY: Family History  Problem Relation Age of Onset  . Birth defects Brother   . Cancer Maternal Grandmother   . Cancer Maternal Grandfather   . Cirrhosis Father   . Colon cancer Neg Hx   . Colon polyps Neg Hx    Family Hx: Mom- 76 and still living, not in good health Dad- age - early 61s,- died of Cirrhosis of the liver 1 Sister who is healthy  ALLERGIES:  is allergic to aspirin; codeine; and nsaids.  MEDICATIONS:  Current Outpatient Prescriptions  Medication Sig Dispense Refill  . ARIPiprazole (ABILIFY) 15 MG tablet Take 15 mg by mouth daily.      Marland Kitchen conjugated estrogens (PREMARIN)  vaginal cream Use 1 gram a night 30 g 12  . dicyclomine (BENTYL) 10 MG capsule Take 1 capsule (10 mg total) by mouth 4 (four) times daily -  before meals and at bedtime. As needed 90 capsule 1  . escitalopram (LEXAPRO) 20 MG tablet Take 30 mg by mouth daily.     . methylphenidate (CONCERTA) 36 MG CR tablet Take 36 mg by mouth every morning.      Marland Kitchen omeprazole (PRILOSEC) 20 MG capsule TAKE 1 CAPSULE BY MOUTH EVERY MORNING 30 capsule 11  . oxybutynin (DITROPAN-XL) 10 MG 24 hr tablet Take 10 mg by mouth daily.    Marland Kitchen oxyCODONE-acetaminophen (ROXICET) 5-325 MG tablet Take 1-2 tablets by mouth every 4 (four)  hours as needed for moderate pain or severe pain. Take lowest effective dose would start 1 tab at a time. 60 tablet 0  . traZODone (DESYREL) 150 MG tablet Take 150 mg by mouth at bedtime.      No current facility-administered medications for this visit.     Review of Systems  Constitutional: Positive for weight loss (20 pounds over 6 months).       Doesn't have an appetite due to pain swallowing  HENT: Negative.        Trouble swallowing due to pain when she swallows  Eyes: Negative.   Respiratory: Negative.   Cardiovascular: Negative.   Gastrointestinal: Negative.   Genitourinary: Negative.   Musculoskeletal: Positive for back pain (not new pain) and neck pain.  Skin: Negative.   Neurological: Negative.   Endo/Heme/Allergies: Negative.   Psychiatric/Behavioral: The patient has insomnia (touble sleeping due to neck pain and back pain which is old).   All other systems reviewed and are negative.  14 point ROS was done and is otherwise as detailed above or in HPI   PHYSICAL EXAMINATION: ECOG PERFORMANCE STATUS: 2 - Symptomatic, <50% confined to bed  Vitals:   05/02/16 0813  BP: (!) 152/57  Pulse: (!) 101  Resp: 16  Temp: 99.7 F (37.6 C)   Filed Weights   05/02/16 0813  Weight: 106 lb 8 oz (48.3 kg)     Physical Exam  Constitutional: She is oriented to person, place, and  time. She appears distressed.  Malnourished Wears dentures  HENT:  Head: Normocephalic and atraumatic.  Mouth/Throat: No oropharyngeal exudate.  Eyes: Conjunctivae and EOM are normal. Pupils are equal, round, and reactive to light. No scleral icterus.  Neck: Normal range of motion. Neck supple.  Obvious L neck mass, approximately 3 to 4 cm n dimension  Cardiovascular: Normal rate, regular rhythm and normal heart sounds.   Pulmonary/Chest: Effort normal and breath sounds normal.  Abdominal: Soft. Bowel sounds are normal. She exhibits no distension and no mass. There is no tenderness. There is no rebound and no guarding.  Musculoskeletal: Normal range of motion.  Neurological: She is alert and oriented to person, place, and time. Gait normal.  Skin: Skin is warm and dry.  Psychiatric: Mood, memory, affect and judgment normal.  Nursing note and vitals reviewed.   LABORATORY DATA:  I have reviewed the data as listed Lab Results  Component Value Date   WBC 4.8 11/09/2015   HGB 10.8 (L) 11/09/2015   HCT 31.8 (L) 11/09/2015   MCV 85.5 11/09/2015   PLT 116 (L) 11/09/2015   CMP     Component Value Date/Time   NA 134 (L) 02/28/2016 1012   K 3.8 02/28/2016 1012   CL 99 (L) 02/28/2016 1012   CO2 27 02/28/2016 1012   GLUCOSE 114 (H) 02/28/2016 1012   BUN 11 02/28/2016 1012   CREATININE 0.55 02/28/2016 1012   CREATININE 0.65 11/09/2015 1527   CALCIUM 9.2 02/28/2016 1012   PROT 8.0 02/28/2016 1012   ALBUMIN 3.7 02/28/2016 1012   AST 18 02/28/2016 1012   ALT 15 02/28/2016 1012   ALKPHOS 66 02/28/2016 1012   BILITOT 0.8 02/28/2016 1012   GFRNONAA >60 02/28/2016 1012   GFRAA >60 02/28/2016 1012     RADIOGRAPHIC STUDIES: I have personally reviewed the radiological images as listed and agreed with the findings in the report. No results found.   Study Result   CLINICAL DATA:  Lump on left side neck  EXAM:  CT NECK WITH CONTRAST  TECHNIQUE: Multidetector CT imaging of the  neck was performed using the standard protocol following the bolus administration of intravenous contrast.  CONTRAST:  7m ISOVUE-300 IOPAMIDOL (ISOVUE-300) INJECTION 61%  COMPARISON:  None.  FINDINGS: Pharynx and larynx: There is a mass lesion of the epiglottis, causing diffuse thickening of the epiglottis and filling the right aspect of the vallecula. On the left, the mass extends superiorly and abuts the root of the tongue. The mass measures approximately 2.3 x 1.2 by 1.7 cm. Additionally, there is a component of the mass that extends inferiorly to the left aryepiglottic fold and fills the left piriform sinus. The soft tissue thickening extends to the anterior commissure. There is no erosion of the thyroid cartilage or hyoid bone.  Salivary glands: The parotid and submandibular glands are normal. No sialolithiasis or salivary ductal dilatation.  Thyroid: Normal  Lymph nodes: At left level IIa, there is a massively enlarged lymph node measuring 3.8 x 3.0 cm with associated central necrosis. There are multiple subcentimeter nodes identified lower within the left cervical chain, but none are enlarged by CT size criteria.  Vascular: The major cervical vessels are patent.  Limited intracranial: Normal  Visualized orbits: Normal  Mastoids and visualized paranasal sinuses: Clear  Skeleton: No lytic or blastic lesions. No bony spinal canal stenosis.  Upper chest: Mild emphysema.  No pulmonary nodules or masses.  Other: None  IMPRESSION: 1. Multilobulated mass of the supraglottic larynx, epiglottis and hypopharynx, which involves the epiglottic cartilage and the left aryepiglottic fold and partially fills the vallecula and left piriform sinus. Superiorly, the mass extends along the left aspect of the hypopharynx adjacent to the base of tongue. Invasion into the tongue base would be difficult to exclude. The appearance is most consistent with a supraglottic  laryngeal squamous cell carcinoma. Recommend correlation with direct visualization. 2. Enlarged necrotic left level IIa lymph node, which is in keeping with lymphatic spread of laryngeal squamous cell carcinoma.   Electronically Signed   By: KUlyses JarredM.D.   On: 04/20/2016 23:43     ASSESSMENT & PLAN:  Supraglottic Mass Enlarged L level IIa  Node Prior tobacco use Weight loss Malnutrition Cancer related pain  CT scans reviewed. Results noted above. She has an appointment with Dr. TBenjamine Molaon Thursday for a diagnostic biopsy. I have spoken with Dr. SIsidore Mooswho will also see the patient in consultation. She wears dentures therefore consultation with Dr. KEnrique Sackwill be deferred.   She will meet with nutrition today.   I have ordered a PET/CT for complete staging and prior to radiation.    I talked to her about chemotherapy education classes. She will meet with JAnderson Maltaour navigator today.    I prescribed her a long acting pain medication to take twice a day with her oxycodone as needed.  [facility]  She will follow up with our clinic post-PET scans.   She will most likely need chemotherapy. She will need a port placed prior, this can be arranged here at [facility] or via IR. PEG can be placed at the same time.  I discussed all of this with the patient and her son today. Her son is a good support although he does not live locally.  ORDERS PLACED FOR THIS ENCOUNTER: No orders of the defined types were placed in this encounter.  Orders Placed This Encounter  Procedures  . NM PET Image Initial (PI) Skull  Base To Thigh    Standing Status:   Future    Number of Occurrences:   1    Standing Expiration Date:   05/02/2017    Order Specific Question:   Reason for Exam (SYMPTOM  OR DIAGNOSIS REQUIRED)    Answer:   head and neck cancer, smoking history, initial staging    Order Specific Question:   Preferred imaging location?     Answer:   [facility]    Order Specific Question:   If indicated for the ordered procedure, I authorize the administration of a radiopharmaceutical per Radiology protocol    Answer:   Yes  . Ambulatory referral to Social Work    Referral Priority:   Routine    Referral Type:   Consultation    Referral Reason:   Specialty Services Required    Number of Visits Requested:   1  . Ambulatory referral to Social Work    Referral Priority:   Routine    Referral Type:   Consultation    Referral Reason:   Specialty Services Required    Number of Visits Requested:   1     MEDICATIONS PRESCRIBED THIS ENCOUNTER: No orders of the defined types were placed in this encounter.  Meds ordered this encounter  Medications  . DISCONTD: oxyCODONE (OXYCONTIN) 15 mg 12 hr tablet    Sig: Take 1 tablet (15 mg total) by mouth every 12 (twelve) hours.    Dispense:  60 tablet    Refill:  0  . oxyCODONE (OXY IR/ROXICODONE) 5 MG immediate release tablet    Sig: Take 1 tablet (5 mg total) by mouth every 4 (four) hours as needed for severe pain.    Dispense:  90 tablet    Refill:  0     This document serves as a record of services personally performed by Ancil Linsey, MD. It was created on her behalf by Shirlean Mylar, a trained medical scribe. The creation of this record is based on the scribe's personal observations and the provider's statements to them. This document has been checked and approved by the attending provider.  I have reviewed the above documentation for accuracy and completeness and I agree with the above.   This note was electronically signed.  Molli Hazard, MD  05/02/2016 9:08 AM

## 2016-05-02 NOTE — Progress Notes (Signed)
Met with pt face to face.  Introduced myself and explain a little bit about my role as the patient navigator.  Pt given a card with all my information on it.  Told pt to call if they had any questions or concerns.  Pt verbalized understanding.  Pt will see Dr Teoh and have a PET scan and follow up here at the clinic. 

## 2016-05-02 NOTE — Progress Notes (Signed)
RD consulted due to new H/N cancer diagnosis.   Contacted Pt by Visiting during office visit   Wt Readings from Last 10 Encounters:  05/02/16 106 lb 8 oz (48.3 kg)  03/01/16 107 lb (48.5 kg)  11/16/15 119 lb (54 kg)  11/12/15 119 lb (54 kg)  11/09/15 115 lb 3.2 oz (52.3 kg)  07/29/15 126 lb (57.2 kg)  05/12/15 134 lb (60.8 kg)  10/07/14 127 lb (57.6 kg)  09/09/14 128 lb (58.1 kg)  07/24/14 129 lb (58.5 kg)   Per documentation, Patient has lost 10-15 lbs in the last 5-6 months. Son reports a more drastic loss of >20 lbs since September/October, which is when she began to develop trouble swallowing. They give pt's UBW as 130-140 lbs.   Patient reports oral intake as poor-fair and is suffering from symptoms including dysphagia to solids and throat soreness.   Patient has some self imposed dietary restrictions. She does not eat meat (not including fish), nor does she eat mashed potatoes or drink milk. Her son says the pt is a picky eater and has largely been surviving on Boost. She drinks 4 of these a day. She also has been having applesauce   Pt denies n/v/d. She has had some constipation.   RD explained some of the detrimental effects that radiation could have on her swallow function. She is encouraged to continue eating by mouth for as long as possible to help preserve this function. RD gave handout on soft/moist high protein foods. Unfortunately, she cant consume many of the options on this handout due to either her dysphagia being too severe, such as with scrambled eggs, or due to her self imposed restrictions; she wont eat meat, mashed potatoes, cream soups, legumes, or milk by itself.   The only items that we were able to agree upon were yogurt (she likes greek yogurt), ice cream and shakes/smoothies. She says she has tried smoothies, but they burned her throat. RD asked to remove the citrus fruits and try again with ice cream. RD reccommended protein powder if she is able to tolerate  shakes/smoothies. Educated this is a very cheap and effective way to increase the protein in her diet.   She has a blender. Advised she can blend her normal meal items. She is told not to try to eat foods that make her cough/gag after eating.   Due to her reliance on Boost, RD told pt about the Ensure assistance program. She does not like Ensure as much, but was still accepting. Will order chocolate.   RD turned attention to Tube feeding. Explained the basics of tube feeding. Informed that her tube is just as important for meeting her hydration needs as her calorie/protein needs. Told pt she would likely be placed on a low fiber formula. Estimated she would need ~ 6 cans of TF a day, but should only start with 1-2 a day to get comfortable with the tube. She can meet the rest of her nutritional requirements with her oral nutritional supplements. She should keep her PEG site clean with only soap/water, nothing abrasive. She should not refrigerate her tube feeding formula. If refrigerates an open can, she should let sit out 10-15 minutes before infusing into stomach.   Briefly discussed some potential complications of tube feeding, such as not being able to infuse recommended volume, having diarrhea, etc.   Pt understandably is overwhelmed at this time.   Left my contact info, coupons, samples, and handouts titled "Soft and Moist High Protein  Menu Ideas" and "Feeding Tube Use and Care (Bolus/Syringe Feedings)".  RD will follow up once her PEG is placed.   Noted SLP about pt already having dysphagia. MD to refer to [place].   Burtis Junes RD, LDN, [place] [facility] Pager: [phone] 05/02/2016 9:34 AM

## 2016-05-02 NOTE — Patient Instructions (Addendum)
[place] at [facility] Discharge Instructions  RECOMMENDATIONS MADE BY THE CONSULTANT AND ANY TEST RESULTS WILL BE SENT TO YOUR REFERRING PHYSICIAN.  You saw Dr.Penland today. You will see Dr. Benjamine Mola this Thursday. PET will be scheduled soon. Follow up after PET here at cancer center.  Pain medication prescriptions given-one is longacting, the other is for breakthrough pain and is short acting. These can be adjusted as needed.  DO NOT GET CONSTIPATED- these pain medications can constipate you very easily- see constipation sheet.  See Amy at checkout for appointments.  Thank you for choosing [place] at [facility] to provide your oncology and hematology care.  To afford each patient quality time with our provider, please arrive at least 15 minutes before your scheduled appointment time.    If you have a lab appointment with the [facility] please come in thru the  Main Entrance and check in at the main information desk  You need to re-schedule your appointment should you arrive 10 or more minutes late.  We strive to give you quality time with our providers, and arriving late affects you and other patients whose appointments are after yours.  Also, if you no show three or more times for appointments you may be dismissed from the clinic at the providers discretion.     Again, thank you for choosing [facility]  Our hope is that these requests will decrease the amount of time that you wait before being seen by our physicians.       _____________________________________________________________  Should you have questions after your visit to [facility], please contact our office at [phone] between the hours of 8:30 a.m. and 4:30 p.m.  Voicemails left after 4:30 p.m. will not be returned until the following business day.  For prescription refill requests, have your pharmacy contact our office.        Resources For Cancer Patients and their Caregivers ? American Cancer Society: Can assist with transportation, wigs, general needs, runs Look Good Feel Better.        450-293-6845 ? Cancer Care: Provides financial assistance, online support groups, medication/co-pay assistance.  1-800-813-HOPE (825)746-5858) ? [place] Assists [place] cancer patients and their families through emotional , educational and financial support.  (623)569-3333 ? [place] DSS Where to apply for food stamps, Medicaid and utility assistance. (312)105-8327 ? [facility]: Transportation to medical appointments. (669)472-4096 ? Social Security Administration: May apply for disability if have a Stage IV cancer. 701-035-8752 2896031624 ? [employer], Disability and Transit Services: Assists with nutrition, care and transit needs. [facility] Support Programs: @10RELATIVEDAYS @ > Cancer Support Group  2nd Tuesday of the month 1pm-2pm, Journey Room  > Creative Journey  3rd Tuesday of the month 1130am-1pm, Journey Room  > Look Good Feel Better  1st Wednesday of the month 10am-12 noon, Journey Room (Call [place] to register [phone])

## 2016-05-03 ENCOUNTER — Ambulatory Visit (HOSPITAL_COMMUNITY): Payer: Medicaid Other | Admitting: Specialist

## 2016-05-03 ENCOUNTER — Telehealth: Payer: Self-pay | Admitting: *Deleted

## 2016-05-03 DIAGNOSIS — M25512 Pain in left shoulder: Secondary | ICD-10-CM

## 2016-05-03 DIAGNOSIS — R29898 Other symptoms and signs involving the musculoskeletal system: Secondary | ICD-10-CM

## 2016-05-03 DIAGNOSIS — M25612 Stiffness of left shoulder, not elsewhere classified: Secondary | ICD-10-CM

## 2016-05-03 NOTE — Therapy (Addendum)
[place], [place], [address] Phone: [phone]   Fax:  [phone]  Occupational Therapy Treatment  Patient Details  Name: BRENLEY PRIORE MRN: [id number] Date of Birth: 05/25/1955 Referring Provider: Earlie Server, MD  Encounter Date: 05/03/2016      OT End of Session - 05/03/16 1117    Visit Number 2   Number of Visits 4   Date for OT Re-Evaluation 06/11/16   Authorization Type Medicaid   Authorization Time Period 3 visits approved from 04/17/16 - 05/28/16   Authorization - Visit Number 1   Authorization - Number of Visits 3   OT Start Time 0160   OT Stop Time 1100   OT Time Calculation (min) 45 min   Activity Tolerance Patient tolerated treatment well   Behavior During Therapy [facility] for tasks assessed/performed      Past Medical History:  Diagnosis Date  . Anxiety   . Arthritis    OA left shoulder  . Asthma   . BMI (body mass index) 20.0-29.9 2010 155 LBS  . Chronic back pain   . Cirrhosis ([place]) NOV 2011   MRI LIVER-5 SML DYSPLASTIC NODULES; 2009: TBILI 0.7 ALK PHOS 128 AST 56 ALT 53 ALB 3.8 INR 1.3  . 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 GERD (gastroesophageal reflux disease)   . HBV (hepatitis B virus) infection 2009: HBsAb POS  . HCV (hepatitis C virus) 2009   COMPLETED RX 2015-OCT 2015 VL UNDETECTABLE  . Hepatitis A 2009 Ab POS  . Tobacco abuse     Past Surgical History:  Procedure Laterality Date  . BIOPSY N/A 07/21/2014   Procedure: BIOPSY;  Surgeon: Danie Binder, MD;  Location: [facility];  Service: Endoscopy;  Laterality: N/A;  Gastric  . COLONOSCOPY  OCT 2011    6 MM SIMPLE ADENOMA, BENIGN COLONIC MUCOSA  . ESOPHAGOGASTRODUODENOSCOPY  MAR 2011   Grade 1 varices, GASTRITIS  . ESOPHAGOGASTRODUODENOSCOPY (EGD) WITH PROPOFOL N/A 07/09/2012   SLF:The mucosa of the esophagus appeared normal/Small hiatal hernia/MODERATE Non-erosive gastritis (inflammation) with hemorrhage  was found in the gastric antrum; multiple biopsies (reactive gastropathy). No H.pylori. No varices.Next EGD 07/2014.  Marland Kitchen ESOPHAGOGASTRODUODENOSCOPY (EGD) WITH PROPOFOL N/A 07/21/2014   SLF: 1. No esophageal , gastric, or duodanl varices seen. 2. Mild non-erosive gastritis.  Marland Kitchen ESOPHAGOGASTRODUODENOSCOPY (EGD) WITH PROPOFOL N/A 11/16/2015   Procedure: ESOPHAGOGASTRODUODENOSCOPY (EGD) WITH PROPOFOL;  Surgeon: Danie Binder, MD;  Location[facility];  Service: Endoscopy;  Laterality: N/A;  815  . Extremity Surgery     and Pelvic, after fall  . FOOT FRACTURE SURGERY    . FOOT SURGERY Right 2009   [facility]-crushed foot with fall and it was repaired  . SAVORY DILATION N/A 11/16/2015   Procedure: SAVORY DILATION;  Surgeon: Danie Binder, MD;  Location[facility];  Service: Endoscopy;  Laterality: N/A;  . SHOULDER ACROMIOPLASTY Left 03/01/2016   Procedure: SHOULDER ACROMIOPLASTY;  Surgeon: Earlie Server, MD;  Location: [place];  Service: Orthopedics;  Laterality: Left;  . SHOULDER ARTHROSCOPY WITH OPEN ROTATOR CUFF REPAIR AND DISTAL CLAVICLE ACROMINECTOMY Left 03/01/2016   Procedure: SHOULDER ARTHROSCOPY WITH SUBACROMIAL DECOMPRESSION and debridement with acromioplasty,OPEN ROTATOR CUFF REPAIR AND DISTAL CLAVICULECTOMY;  Surgeon: Earlie Server, MD;  Location: [place];  Service: Orthopedics;  Laterality: Left;  Pre/Post Op scalene block  . TOTAL ABDOMINAL HYSTERECTOMY  1995 DUB    There were no vitals filed for  this visit.      Subjective Assessment - 05/03/16 1114    Subjective  S:  I think its moving pretty good.    Currently in Pain? Yes   Pain Score 3    Pain Location Shoulder   Pain Orientation Left   Pain Descriptors / Indicators Aching   Pain Type Surgical pain   Pain Onset More than a month ago   Pain Frequency Intermittent   Aggravating Factors  reaching behind her back or overhead   Pain Relieving Factors rest, medication   Effect of Pain on  Daily Activities decreased independence with functional  activities             [facility] OT Assessment - 05/03/16 0001      Assessment   Diagnosis Left rotator cuff repair     Precautions   Precautions Shoulder   Type of Shoulder Precautions patient is 6 weeks post op at evaluation. Ok to begin AA/ROM. Progress to A/ROM at  8 weeks (04/26/16). Progress to strengthening at 12 weeks ( 06/07/16)                  OT Treatments/Exercises (OP) - 05/03/16 0001      Exercises   Exercises Shoulder     Shoulder Exercises: Supine   Protraction PROM;AROM;10 reps   Horizontal ABduction PROM;AROM;10 reps   External Rotation PROM;AROM;10 reps   Internal Rotation PROM;AROM;10 reps   Flexion PROM;AROM;10 reps   ABduction PROM;AROM;10 reps     Shoulder Exercises: Standing   Protraction AROM;10 reps   Horizontal ABduction AROM;10 reps   External Rotation AROM;Theraband;10 reps   Theraband Level (Shoulder External Rotation) Level 3 (Green)   Internal Rotation AROM;Theraband;10 reps   Theraband Level (Shoulder Internal Rotation) Level 3 (Green)   Flexion AROM;10 reps   ABduction AROM;10 reps   Extension Theraband;10 reps   Theraband Level (Shoulder Extension) Level 3 (Green)   Row Theraband;10 reps   Theraband Level (Shoulder Row) Level 3 (Green)   Retraction Theraband;10 reps   Theraband Level (Shoulder Retraction) Level 3 (Green)     Shoulder Exercises: Therapy Ball   Flexion 10 reps   ABduction 10 reps   Right/Left 5 reps     Shoulder Exercises: ROM/Strengthening   Wall Wash attempted 2', had to stop at 1.5' due to fatigue     Manual Therapy   Manual Therapy Myofascial release   Manual therapy comments manual therapy completed seperately from all other interventions this date   Myofascial Release myofascial release and manual stretching to left upper arm, scapular, and shoulder region to decrease pain and restrictions and improve pain free mobility in left shoulder region                  OT Education - 05/03/16 1115    Education provided Yes   Education Details A/ROM in standing, shoulder stretches in standing, scapular theraband, reviewed treatment plan with patient and issued a copy of the plan and goals.     Person(s) Educated Patient   Methods Explanation;Demonstration;Handout   Comprehension Verbalized understanding;Returned demonstration          OT Short Term Goals - 05/03/16 1124      OT SHORT TERM GOAL #1   Title Patient will be educated and independent with HEP to increase functional use of LUE during daily tasks.    Time 3   Period Weeks   Status On-going     OT SHORT TERM GOAL #2  Title Patient will increase P/ROM to WNL to increase ability to get shirts on and off with less difficulty.    Time 3   Period Weeks   Status On-going     OT SHORT TERM GOAL #3   Title Patient will increase LUE strength to 3/5 to increase ability to complete overhead reaching tasks.   Time 3   Period Weeks   Status On-going     OT SHORT TERM GOAL #4   Status On-going     OT SHORT TERM GOAL #5   Status On-going           OT Long Term Goals - 05/03/16 1125      OT LONG TERM GOAL #1   Title Patient will return to highest level of independence with all daily tasks when using her LUE.    Time 6   Period Weeks   Status On-going     OT LONG TERM GOAL #2   Title Patient will increase A/ROM of LUE to WNL to increase ability to complete activities overhead.   Time 6   Period Weeks   Status On-going     OT LONG TERM GOAL #3   Title Patient will increase LUE strength to 4/5 to increase ability to complete normal lifting activities at home.   Time 6   Period Weeks   Status On-going     OT LONG TERM GOAL #4   Title Patient will decrease pain level when using LUE at home to 2/10 or less.   Time 6   Period Weeks   Status On-going     OT LONG TERM GOAL #5   Title Patient will decrease fascial restrictions to trace amount in LUE to  increase functional mobility needed for reaching tasks.    Time 6   Period Weeks   Status On-going               Plan - 05/03/16 1118    Clinical Impression Statement A:  Patient is progressing well towards functional use of left shoulder within constraints of her RCR protocol.  Paitent educated on HEP for A/ROM, shoulder stretches, and scapular theraband exercises this date.     Plan P:  reassess for MD visit, update HEP, begin strengthening 2/28 per protocol.   OT Home Exercise Plan 1/3: AA/ROM exercises; 05/03/16:  A/ROM, shoulder stretches, scapular theraband      Patient will benefit from skilled therapeutic intervention in order to improve the following deficits and impairments:  Decreased strength, Pain, Decreased range of motion, Increased fascial restricitons, Impaired UE functional use  Visit Diagnosis: Acute pain of left shoulder  Stiffness of left shoulder, not elsewhere classified  Other symptoms and signs involving the musculoskeletal system    Problem List Patient Active Problem List   Diagnosis Date Noted  . Dysphagia 11/09/2015  . IBS (irritable bowel syndrome) 05/12/2015  . Constipation 10/07/2014  . Gastritis 10/07/2014  . Bilateral foot pain 09/09/2014  . Colon adenomas 04/01/2014  . Postmenopausal atrophic vaginitis 07/18/2012  . Dyspareunia 07/18/2012  . 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 [place], [place], [address] Phone: [phone]   Fax:  [phone]  Name: ELARIA OSIAS MRN: [id number] Date of Birth: 1955/09/22    OCCUPATIONAL THERAPY DISCHARGE SUMMARY  Visits from Start of Care: 3 Current functional level related  to goals / functional outcomes: See above. Patient has met no therapy goals.    Remaining deficits: Pt continues to have deficits with  strength, ROM, pain, and fascial restrictions.   Education / Equipment: Pt was educated on shoulder ROM exercises.  Plan: Patient agrees to discharge.  Patient goals were not met. Patient is being discharged due to not returning since the last visit.  ?????         Ailene Ravel, OTR/L,CBIS  [phone]

## 2016-05-03 NOTE — Telephone Encounter (Signed)
Oncology Nurse Navigator Documentation  Placed introductory call to new referral patient Ms. Vallone.  Introduced myself as the H&N oncology nurse navigator that works with Dr. Isidore Moos to whom she has been referred by Dr. Whitney Muse..  She confirmed her understanding of referral.  I briefly explained my role as her navigator, provided contact information.  She indicated she has bx 1/25 with Dr. Benjamine Mola, [place]; PET 1/30 11:30 [facility].  I explained I will coordinate appt to see Dr. Isidore Moos s/p PET, potentially schedule for 1/30 afternoon.  I later called to provide tentavie appt 1/30 2:30 NE 3:00 Consult.  She understands I will confirm later this week.  I encouraged her to call with questions/concerns.  She verbalized understanding of information provided, expressed appreciation for my call.  [name], RN, BSN, [place] On[facility] Nurse [place] [facility] [place] [phone]

## 2016-05-03 NOTE — Patient Instructions (Addendum)
ROM: Abduction (Standing)   Bring arms straight out from sides and raise as high as possible without pain. Repeat ____ times per set. Do ____ sets per session. Do ____ sessions per day.  http://orth.exer.us/910   Copyright  VHI. All rights reserved.   Extension (Active) ROM: Extension (Standing)   Bring arms straight back as far as possible without pain. Repeat ____ times per set. Do ____ sets per session. Do ____ sessions per day.  http://orth.exer.us/916   Copyright  VHI. All rights reserved.   ROM: External / Internal Rotation - in Abduction (Standing)   With upper arms parallel to floor and elbows bent at right angles, gently rotate arms up then down as far as possible without pain. Repeat ____ times per set. Do ____ sets per session. Do ____ sessions per day.  http://orth.exer.us/912   Copyright  VHI. All rights reserved.    Flexors Stretch (Active)   Stand, arms straight at sides. Bring arms straight forward and upward as high as possible without pain. Hold ___ seconds. Repeat ___ times per session. Do ___ sessions per day.  Copyright  VHI. All rights reserved.   Scapular Retraction (Standing)   With arms at sides, pinch shoulder blades together. Repeat ____ times per set. Do ____ sets per session. Do ____ sessions per day.  http://orth.exer.us/944   Copyright  VHI. All rights reserved.  (Home) Extension: Isometric / Bilateral Arm Retraction - Sitting   Facing anchor, hold hands and elbow at shoulder height, with elbow bent.  Pull arms back to squeeze shoulder blades together. Repeat 10-15 times.  Copyright  VHI. All rights reserved.   (Home) Retraction: Row - Bilateral (Anchor)   Facing anchor, arms reaching forward, pull hands toward stomach, keeping elbows bent and at your sides and pinching shoulder blades together. Repeat 10-15 times.  Copyright  VHI. All rights reserved.   (Clinic) Extension / Flexion (Assist)   Face anchor, pull  arms back, keeping elbow straight, and squeze shoulder blades together. Repeat 10-15 times.   Copyright  VHI. All rights reserved.  Strengthening: Resisted External Rotation    Hold tubing in right hand, elbow at side and forearm across body. Rotate forearm out. Repeat ____ times per set. Do ____ sets per session. Do ____ sessions per day.  http://orth.exer.us/829   Copyright  VHI. All rights reserved.  .Strengthening: Resisted Internal Rotation    Hold tubing in left hand, elbow at side and forearm out. Rotate forearm in across body. Repeat ____ times per set. Do ____ sets per session. Do ____ sessions per day.  http://orth.exer.us/831   Copyright  VHI. All rights reserved.  Doorway Stretch  Place each hand opposite each other on the doorway. (You can change where you feel the stretch by moving arms higher or lower.) Step through with one foot and bend front knee until a stretch is felt and hold. Step through with the opposite foot on the next rep. Hold for _____ seconds. Repeat ____times.     Scapular Retraction (Standing)   With arms at sides, pinch shoulder blades together. Repeat ____ times per set. Do ____ sets per session. Do ____ sessions per day.  http://orth.exer.us/944   Copyright  VHI. All rights reserved.   Internal Rotation Across Back  Grab the end of a towel with your affected side, palm facing backwards. Grab the towel with your unaffected side and pull your affected hand across your back until you feel a stretch in the front of your shoulder. If you feel  pain, pull just to the pain, do not pull through the pain. Hold. Return your affected arm to your side. Try to keep your hand/arm close to your body during the entire movement.            Posterior Capsule Stretch   Stand or sit, one arm across body so hand rests over opposite shoulder. Gently push on crossed elbow with other hand until stretch is felt in shoulder of crossed arm. Hold ___  seconds.  Repeat ___ times per session. Do ___ sessions per day.   Wall Flexion  Using a towel, slide your arm up the wall until a stretch is felt in your shoulder .

## 2016-05-04 ENCOUNTER — Ambulatory Visit (INDEPENDENT_AMBULATORY_CARE_PROVIDER_SITE_OTHER): Payer: Medicaid Other | Admitting: Otolaryngology

## 2016-05-04 DIAGNOSIS — D380 Neoplasm of uncertain behavior of larynx: Secondary | ICD-10-CM | POA: Diagnosis not present

## 2016-05-04 NOTE — Progress Notes (Signed)
Head and Neck Cancer Location of Tumor / Histology:  05/08/16 Biopsy Diagnosis Epiglottis, biopsy, Left - INVASIVE SQUAMOUS CELL CARCINOMA.  Patient presented with symptoms of: The inside of her mouth was sore in September and she saw her dentist and states she received a shot in her jaw which didn't help. She then noticed swelling on the left side of her neck in November. She had surgery on her shoulder on 03/01/16 and was told by the anesthesiologist after the surgery to go see an ENT.   Biopsies of  revealed:  05/08/16 Diagnosis Epiglottis, biopsy, Left - INVASIVE SQUAMOUS CELL CARCINOMA.  Nutrition Status Yes No Comments  Weight changes? [x]  []  She has lost at least 20 lbs in the past 6 months  Swallowing concerns? [x]  []  She can tolerate liquids, but has difficulty with solid foods related to pain, and the food won't go down easy.   PEG? []  [x]     Referrals Yes No Comments  Social Work? [x]  []    Dentistry? []  [x]  She does not have teeth.   Swallowing therapy? [x]  []    Nutrition? [x]  []    Med/Onc? Yes []  Dr. Whitney Muse 05/02/16   Safety Issues Yes No Comments  Prior radiation? []  [x]    Pacemaker/ICD? []  [x]    Possible current pregnancy? []  [x]    Is the patient on methotrexate? []  [x]     Tobacco/Marijuana/Snuff/ETOH use: She is a former smoker having quit 2 years ago.   Past/Anticipated interventions by otolaryngology, if any:  05/08/16 Dr. Benjamine Mola PROCEDURE PERFORMED:  Direct laryngoscopy and biopsy of a large supraglottic mass  Past/Anticipated interventions by medical oncology, if any:  05/02/16 Dr. Whitney Muse. She has recommended chemoradiation therapy with high-dose cisplantin on days 1, 22, and 43 of radiation     Current Complaints / other details:   She has concerns about transportation to receive radiation in [place]. She lives in [place], [place].   05/09/16 PET  BP (!) 134/56   Pulse 71   Temp 98.4 F (36.9 C)   Ht 5' 3.5" (1.613 m)   Wt 106 lb (48.1 kg)   SpO2 98%  Comment: room air  BMI 18.48 kg/m    Wt Readings from Last 3 Encounters:  05/09/16 106 lb (48.1 kg)  05/08/16 107 lb 3.2 oz (48.6 kg)  05/02/16 106 lb 8 oz (48.3 kg)

## 2016-05-05 ENCOUNTER — Encounter (HOSPITAL_BASED_OUTPATIENT_CLINIC_OR_DEPARTMENT_OTHER): Payer: Self-pay | Admitting: *Deleted

## 2016-05-05 ENCOUNTER — Other Ambulatory Visit: Payer: Self-pay | Admitting: Otolaryngology

## 2016-05-05 ENCOUNTER — Encounter: Payer: Self-pay | Admitting: Radiation Oncology

## 2016-05-08 ENCOUNTER — Encounter (HOSPITAL_BASED_OUTPATIENT_CLINIC_OR_DEPARTMENT_OTHER): Payer: Self-pay | Admitting: *Deleted

## 2016-05-08 ENCOUNTER — Ambulatory Visit (HOSPITAL_BASED_OUTPATIENT_CLINIC_OR_DEPARTMENT_OTHER): Payer: Medicaid Other | Admitting: Anesthesiology

## 2016-05-08 ENCOUNTER — Telehealth (HOSPITAL_COMMUNITY): Payer: Self-pay | Admitting: Oncology

## 2016-05-08 ENCOUNTER — Encounter (HOSPITAL_BASED_OUTPATIENT_CLINIC_OR_DEPARTMENT_OTHER)
Admission: RE | Disposition: A | Discharge status: Home / Self Care | Payer: Self-pay | Source: Ambulatory Visit | Attending: Otolaryngology

## 2016-05-08 ENCOUNTER — Ambulatory Visit (HOSPITAL_BASED_OUTPATIENT_CLINIC_OR_DEPARTMENT_OTHER)
Admission: RE | Admit: 2016-05-08 | Discharge: 2016-05-08 | Disposition: A | Discharge status: Home / Self Care | Payer: Medicaid Other | Source: Ambulatory Visit | Attending: Otolaryngology | Admitting: Otolaryngology

## 2016-05-08 DIAGNOSIS — Z87891 Personal history of nicotine dependence: Secondary | ICD-10-CM | POA: Diagnosis not present

## 2016-05-08 DIAGNOSIS — F329 Major depressive disorder, single episode, unspecified: Secondary | ICD-10-CM | POA: Diagnosis not present

## 2016-05-08 DIAGNOSIS — F419 Anxiety disorder, unspecified: Secondary | ICD-10-CM | POA: Diagnosis not present

## 2016-05-08 DIAGNOSIS — C321 Malignant neoplasm of supraglottis: Secondary | ICD-10-CM | POA: Diagnosis not present

## 2016-05-08 DIAGNOSIS — Z85828 Personal history of other malignant neoplasm of skin: Secondary | ICD-10-CM | POA: Diagnosis not present

## 2016-05-08 DIAGNOSIS — K7460 Unspecified cirrhosis of liver: Secondary | ICD-10-CM | POA: Insufficient documentation

## 2016-05-08 DIAGNOSIS — K219 Gastro-esophageal reflux disease without esophagitis: Secondary | ICD-10-CM | POA: Insufficient documentation

## 2016-05-08 DIAGNOSIS — Z79899 Other long term (current) drug therapy: Secondary | ICD-10-CM | POA: Diagnosis not present

## 2016-05-08 DIAGNOSIS — D380 Neoplasm of uncertain behavior of larynx: Secondary | ICD-10-CM | POA: Diagnosis not present

## 2016-05-08 DIAGNOSIS — R220 Localized swelling, mass and lump, head: Secondary | ICD-10-CM | POA: Diagnosis present

## 2016-05-08 DIAGNOSIS — Z79891 Long term (current) use of opiate analgesic: Secondary | ICD-10-CM | POA: Insufficient documentation

## 2016-05-08 DIAGNOSIS — J449 Chronic obstructive pulmonary disease, unspecified: Secondary | ICD-10-CM | POA: Insufficient documentation

## 2016-05-08 DIAGNOSIS — R221 Localized swelling, mass and lump, neck: Secondary | ICD-10-CM | POA: Insufficient documentation

## 2016-05-08 HISTORY — PX: LARYNGOSCOPY: SHX5203

## 2016-05-08 SURGERY — LARYNGOSCOPY
Anesthesia: General | Site: Throat | Laterality: Left

## 2016-05-08 MED ORDER — MIDAZOLAM HCL 2 MG/2ML IJ SOLN
1.0000 mg | INTRAMUSCULAR | Status: DC | PRN
  Administered 2016-05-08: 1 mg via INTRAVENOUS

## 2016-05-08 MED ORDER — MEPERIDINE HCL 25 MG/ML IJ SOLN: 6.2500 mg | INTRAMUSCULAR | Status: DC | PRN

## 2016-05-08 MED ORDER — EPINEPHRINE PF 1 MG/ML IJ SOLN
INTRAMUSCULAR | Status: DC | PRN
  Administered 2016-05-08: 1 mg via ENDOTRACHEOPULMONARY

## 2016-05-08 MED ORDER — DEXAMETHASONE SODIUM PHOSPHATE 10 MG/ML IJ SOLN
INTRAMUSCULAR | Status: AC
  Filled 2016-05-08: qty 1

## 2016-05-08 MED ORDER — FENTANYL CITRATE (PF) 100 MCG/2ML IJ SOLN
50.0000 ug | INTRAMUSCULAR | Status: DC | PRN
  Administered 2016-05-08: 50 ug via INTRAVENOUS

## 2016-05-08 MED ORDER — FENTANYL CITRATE (PF) 100 MCG/2ML IJ SOLN
INTRAMUSCULAR | Status: AC
  Filled 2016-05-08: qty 2

## 2016-05-08 MED ORDER — EPINEPHRINE 30 MG/30ML IJ SOLN
INTRAMUSCULAR | Status: AC
  Filled 2016-05-08: qty 1

## 2016-05-08 MED ORDER — DEXAMETHASONE SODIUM PHOSPHATE 4 MG/ML IJ SOLN
INTRAMUSCULAR | Status: DC | PRN
  Administered 2016-05-08: 10 mg via INTRAVENOUS

## 2016-05-08 MED ORDER — ONDANSETRON HCL 4 MG/2ML IJ SOLN
INTRAMUSCULAR | Status: AC
  Filled 2016-05-08: qty 2

## 2016-05-08 MED ORDER — MIDAZOLAM HCL 2 MG/2ML IJ SOLN: 0.5000 mg | Freq: Once | INTRAMUSCULAR | Status: DC | PRN

## 2016-05-08 MED ORDER — PROMETHAZINE HCL 25 MG/ML IJ SOLN: 6.2500 mg | INTRAMUSCULAR | Status: DC | PRN

## 2016-05-08 MED ORDER — LACTATED RINGERS IV SOLN
INTRAVENOUS | Status: DC
  Administered 2016-05-08: 10 mL/h via INTRAVENOUS
  Administered 2016-05-08: 11:00:00 via INTRAVENOUS

## 2016-05-08 MED ORDER — LIDOCAINE HCL (CARDIAC) 20 MG/ML IV SOLN
INTRAVENOUS | Status: DC | PRN
  Administered 2016-05-08: 50 mg via INTRAVENOUS

## 2016-05-08 MED ORDER — SCOPOLAMINE 1 MG/3DAYS TD PT72: 1.0000 | MEDICATED_PATCH | Freq: Once | TRANSDERMAL | Status: DC | PRN

## 2016-05-08 MED ORDER — PROPOFOL 10 MG/ML IV BOLUS
INTRAVENOUS | Status: DC | PRN
  Administered 2016-05-08: 50 mg via INTRAVENOUS
  Administered 2016-05-08: 150 mg via INTRAVENOUS

## 2016-05-08 MED ORDER — LIDOCAINE 2% (20 MG/ML) 5 ML SYRINGE
INTRAMUSCULAR | Status: AC
  Filled 2016-05-08: qty 5

## 2016-05-08 MED ORDER — FENTANYL CITRATE (PF) 100 MCG/2ML IJ SOLN
25.0000 ug | INTRAMUSCULAR | Status: DC | PRN
  Administered 2016-05-08 (×2): 25 ug via INTRAVENOUS
  Administered 2016-05-08: 50 ug via INTRAVENOUS

## 2016-05-08 MED ORDER — SUCCINYLCHOLINE CHLORIDE 200 MG/10ML IV SOSY
PREFILLED_SYRINGE | INTRAVENOUS | Status: AC
  Filled 2016-05-08: qty 10

## 2016-05-08 MED ORDER — MIDAZOLAM HCL 2 MG/2ML IJ SOLN
INTRAMUSCULAR | Status: AC
  Filled 2016-05-08: qty 2

## 2016-05-08 MED ORDER — SUCCINYLCHOLINE CHLORIDE 20 MG/ML IJ SOLN
INTRAMUSCULAR | Status: DC | PRN
  Administered 2016-05-08: 100 mg via INTRAVENOUS

## 2016-05-08 MED ORDER — ONDANSETRON HCL 4 MG/2ML IJ SOLN
INTRAMUSCULAR | Status: DC | PRN
  Administered 2016-05-08: 4 mg via INTRAVENOUS

## 2016-05-08 SURGICAL SUPPLY — 24 items
CANISTER SUCT 1200ML W/VALVE (MISCELLANEOUS) ×3 IMPLANT
GLOVE BIO SURGEON STRL SZ7.5 (GLOVE) ×3 IMPLANT
GLOVE BIOGEL PI IND STRL 7.0 (GLOVE) ×1 IMPLANT
GLOVE BIOGEL PI INDICATOR 7.0 (GLOVE) ×2
GLOVE ECLIPSE 6.5 STRL STRAW (GLOVE) ×3 IMPLANT
GOWN STRL REUS W/ TWL LRG LVL3 (GOWN DISPOSABLE) ×2 IMPLANT
GOWN STRL REUS W/TWL LRG LVL3 (GOWN DISPOSABLE) ×4
GUARD TEETH (MISCELLANEOUS) IMPLANT
MARKER SKIN DUAL TIP RULER LAB (MISCELLANEOUS) IMPLANT
NEEDLE HYPO 18GX1.5 BLUNT FILL (NEEDLE) ×3 IMPLANT
NEEDLE SPNL 22GX7 QUINCKE BK (NEEDLE) IMPLANT
NEEDLE SPNL 25GX3.5 QUINCKE BL (NEEDLE) ×3 IMPLANT
NS IRRIG 1000ML POUR BTL (IV SOLUTION) ×3 IMPLANT
PATTIES SURGICAL .5 X3 (DISPOSABLE) ×3 IMPLANT
SHEET MEDIUM DRAPE 40X70 STRL (DRAPES) ×3 IMPLANT
SLEEVE SCD COMPRESS KNEE MED (MISCELLANEOUS) IMPLANT
SOLUTION BUTLER CLEAR DIP (MISCELLANEOUS) ×3 IMPLANT
SPONGE GAUZE 4X4 12PLY STER LF (GAUZE/BANDAGES/DRESSINGS) ×6 IMPLANT
SURGILUBE 2OZ TUBE FLIPTOP (MISCELLANEOUS) IMPLANT
SYR CONTROL 10ML LL (SYRINGE) IMPLANT
SYR TB 1ML LL NO SAFETY (SYRINGE) ×3 IMPLANT
TOWEL OR 17X24 6PK STRL BLUE (TOWEL DISPOSABLE) ×3 IMPLANT
TUBE CONNECTING 20'X1/4 (TUBING) ×1
TUBE CONNECTING 20X1/4 (TUBING) ×2 IMPLANT

## 2016-05-08 NOTE — H&P (Signed)
Cc: Head and neck masses  HPI: The patient is a 61 year old female who presents today for evaluation of possible head and neck cancer.  The patient is seen in consultation requested by Dr. Ancil Linsey.  According to the patient, she has been experiencing progressive dysphagia for the past 6 months. She noted an enlarging left neck mass approximately 2 months ago.  She also reports worsening of her hoarseness over the past few months.  She reports 30 lb weight loss over the past 6 months.  The patient quit the use of tobacco 2 years ago.  She previously smoked 1 pack per day for 30+ years.  She underwent a neck CT scan on 04/21/2015.  The CT showed a 3.8 cm left Level II neck mass.  In addition, the patient has a large 2.3 cm mass on her epiglottis extending along the left aryepiglottic folds down to the pyriform sinus. The findings were concerning for malignancy.    The patient's review of systems (constitutional, eyes, ENT, cardiovascular, respiratory, GI, musculoskeletal, skin, neurologic, psychiatric, endocrine, hematologic, allergic) is noted in the ROS questionnaire.  It is reviewed with the patient.  Family health history: Diabetes.  Major events: Right foot surgery, hysterectomy.  Ongoing medical problems: Liver disease Cirrhosis of the liver, hepatitis C , skin cancer, anemia, arthritis, headache,depression, weight loss.  Social history: The patient is single. She is a former smoker. She denies the use of alcohol or illegal drugs.   Exam General: Communicates without difficulty, well nourished, no acute distress. Head: Normocephalic, no evidence injury, no tenderness, facial buttresses intact without stepoff. Face/sinus: No tenderness to palpation and percussion. Facial movement is normal and symmetric. Eyes: PERRL, EOMI. No scleral icterus, conjunctivae clear. Neuro: CN II exam reveals vision grossly intact.  No nystagmus at any point of gaze. Ears: Auricles well formed without lesions.   Ear canals are intact without mass or lesion.  No erythema or edema is appreciated.  The TMs are intact without fluid. Nose: External evaluation reveals normal support and skin without lesions.  Dorsum is intact.  Anterior rhinoscopy reveals healthy pink mucosa over anterior aspect of inferior turbinates and intact septum.  No purulence noted. Oral:  Oral cavity and oropharynx are intact, symmetric, without erythema or edema.  Mucosa is moist without lesions. Neck: Full range of motion without pain.  The patient has a 3.8 cm left Level II mass. Neuro:  CN 2-12 grossly intact. Gait normal. Vestibular: No nystagmus at any point of gaze.   Procedure:  Flexible Fiberoptic Laryngoscopy Risks, benefits, and alternatives of flexible endoscopy were explained to the patient.  Specific mention was made of the risk of throat numbness with difficulty swallowing, possible bleeding from the nose and mouth, and pain from the procedure.  The patient gave oral consent to proceed.  The nasal cavities were decongested and anesthetised with a combination of oxymetazoline and 4% lidocaine solution.  The flexible scope was inserted into the right nasal cavity and advanced towards the nasopharynx.  Visualized mucosa over the turbinates and septum were as described above.  The nasopharynx was clear.  Oropharyngeal walls were symmetric and mobile without lesion, mass, or edema.  Hypopharynx was also without  lesion or edema. A supraglottic ulcerative mass was noted, eroding most of her epiglottis, extending down to the left vallecula.   True vocal folds were mobile.    Assessment 1.  The patient is noted to have a supraglottic ulcerative mass, eroding most of her epiglottis, extending down to the  left vallecula.  2.  The patient also has a 3.8 cm left Level II lymph node.  3.  The patient's vocal cords are mobile bilaterally.  4.  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    Plan  1.  The physical exam and laryngoscopy findings are reviewed with the patient.  2.  The CT images are also reviewed.  3. We will proceed with direct laryngoscopy and biopsy of her supraglottic mass.  4.  The risks, benefits, alternatives and details of the procedure are reviewed with the patient.  5.  Questions are invited and answered.

## 2016-05-08 NOTE — Discharge Instructions (Addendum)
The patient may resume all her previous activities and diet. She will follow-up in my office in approximately 1 week.    Post Anesthesia Home Care Instructions  Activity: Get plenty of rest for the remainder of the day. A responsible adult should stay with you for 24 hours following the procedure.  For the next 24 hours, DO NOT: -Drive a car -Paediatric nurse -Drink alcoholic beverages -Take any medication unless instructed by your physician -Make any legal decisions or sign important papers.  Meals: Start with liquid foods such as gelatin or soup. Progress to regular foods as tolerated. Avoid greasy, spicy, heavy foods. If nausea and/or vomiting occur, drink only clear liquids until the nausea and/or vomiting subsides. Call your physician if vomiting continues.  Special Instructions/Symptoms: Your throat may feel dry or sore from the anesthesia or the breathing tube placed in your throat during surgery. If this causes discomfort, gargle with warm salt water. The discomfort should disappear within 24 hours.  If you had a scopolamine patch placed behind your ear for the management of post- operative nausea and/or vomiting:  1. The medication in the patch is effective for 72 hours, after which it should be removed.  Wrap patch in a tissue and discard in the trash. Wash hands thoroughly with soap and water. 2. You may remove the patch earlier than 72 hours if you experience unpleasant side effects which may include dry mouth, dizziness or visual disturbances. 3. Avoid touching the patch. Wash your hands with soap and water after contact with the patch.

## 2016-05-08 NOTE — Op Note (Signed)
DATE OF PROCEDURE:  05/08/2016                              OPERATIVE REPORT  SURGEON:  Leta Baptist, MD  PREOPERATIVE DIAGNOSES: 1. Supraglottic mass  POSTOPERATIVE DIAGNOSES: 1. Supraglottic mass  PROCEDURE PERFORMED:  Direct laryngoscopy and biopsy of a large supraglottic mass  ANESTHESIA:  General endotracheal tube anesthesia.  COMPLICATIONS:  None.  ESTIMATED BLOOD LOSS:  Minimal.  INDICATION FOR PROCEDURE:  Tiffany Hale is a 61 y.o. female with a history of an enlarging left neck mass. The patient has also noted increasing dysphonia and dysphagia. On her recent CT scan, she was also noted to have a large supraglottic mass, eroding most of her epiglottis. On physical examination, a fungating mass was noted to erode most of the epiglottis, extending down the left aryepiglottic fold, filling most of the left piriform sinus. The findings were concerning for squamous cell carcinoma.  Based on the above findings, the decision was made for the patient to undergo the biopsy procedure.  The risks, benefits, alternatives, and details of the procedure were discussed with the patient.  Questions were invited and answered.  Informed consent was obtained.  DESCRIPTION:  The patient was taken to the operating room and placed supine on the operating table.  General endotracheal tube anesthesia was administered by the anesthesiologist.  The patient was positioned and prepped and draped in a standard fashion for direct laryngoscopy.  A Dedo laryngoscope was inserted via the oral cavity into the pharynx. Examination of the epiglottis revealed a large fungating mass, eroding most of the epiglottis, extending down the left epiglottic fold, and filling the left piriform sinus. Both vocal cords are noted to be intact and symmetric. Multiple biopsy specimens were obtained from the supraglottic mass.  It should also be noted that the patient has a large nearly 4 cm left level II neck mass. This likely  represented a metastatic lymph node.  The care of the patient was turned over to the anesthesiologist.  The patient was awakened from anesthesia without difficulty.  The patient was extubated and transferred to the recovery room in good condition.  OPERATIVE FINDINGS:A large supraglottic mass, eroding the epiglottis, aryepiglottic folds, and the left piriform sinus.   SPECIMEN:  Supraglottic mass biopsy specimens.  FOLLOWUP CARE:  The patient will be discharged home once awake and alert. The patient will follow up in my office in approximately 1 week.  Ascencion Dike 05/08/2016 12:55 PM

## 2016-05-08 NOTE — Telephone Encounter (Signed)
Peer to peer completed for PET imaging for staging purposes.  PET scan is approved.  Authorization Number:  [id number].   Robynn Pane, PA-C 05/08/2016 12:35 PM

## 2016-05-08 NOTE — Anesthesia Postprocedure Evaluation (Signed)
Anesthesia Post Note  Patient: Tiffany Hale  Procedure(s) Performed: Procedure(s) (LRB): DIRECT LARYNGOSCOPY WITH LARYNGEAL MASS BIOPSY (Left)  Patient location during evaluation: PACU Anesthesia Type: General Level of consciousness: awake and alert, oriented and patient cooperative Pain management: pain level controlled Vital Signs Assessment: post-procedure vital signs reviewed and stable Respiratory status: spontaneous breathing, nonlabored ventilation and respiratory function stable (airway ok) Cardiovascular status: blood pressure returned to baseline and stable Postop Assessment: no signs of nausea or vomiting Anesthetic complications: no       Last Vitals:  Vitals:   05/08/16 1400 05/08/16 1415  BP: 124/81 117/62  Pulse: 81 77  Resp: 10 11  Temp:      Last Pain:  Vitals:   05/08/16 1400  TempSrc:   PainSc: 7                  Sherrian Nunnelley,E. Ashden Sonnenberg

## 2016-05-08 NOTE — Anesthesia Preprocedure Evaluation (Addendum)
Anesthesia Evaluation  Patient identified by MRN, date of birth, ID band Patient awake    Reviewed: Allergy & Precautions, NPO status , Patient's Chart, lab work & pertinent test results  History of Anesthesia Complications Negative for: history of anesthetic complications  Airway Mallampati: II  TM Distance: >3 FB Neck ROM: Full    Dental  (+) Edentulous Upper, Edentulous Lower   Pulmonary COPD, former smoker (quit 2016),    breath sounds clear to auscultation       Cardiovascular (-) anginanegative cardio ROS   Rhythm:Regular Rate:Normal     Neuro/Psych Anxiety Depression negative neurological ROS     GI/Hepatic GERD  Medicated and Controlled,(+) Cirrhosis       , Hepatitis -, A, B  Endo/Other  negative endocrine ROS  Renal/GU negative Renal ROS     Musculoskeletal  (+) Arthritis ,   Abdominal   Peds  Hematology negative hematology ROS (+)   Anesthesia Other Findings   Reproductive/Obstetrics                            Anesthesia Physical Anesthesia Plan  ASA: III  Anesthesia Plan: General   Post-op Pain Management:    Induction: Intravenous  Airway Management Planned: Oral ETT  Additional Equipment:   Intra-op Plan:   Post-operative Plan: Extubation in OR  Informed Consent: I have reviewed the patients History and Physical, chart, labs and discussed the procedure including the risks, benefits and alternatives for the proposed anesthesia with the patient or authorized representative who has indicated his/her understanding and acceptance.   Dental advisory given  Plan Discussed with: CRNA and Surgeon  Anesthesia Plan Comments: (Plan routine monitors, GETA )        Anesthesia Quick Evaluation

## 2016-05-08 NOTE — Anesthesia Procedure Notes (Signed)
Procedure Name: Intubation Date/Time: 05/08/2016 12:40 PM Performed by: Lieutenant Diego Pre-anesthesia Checklist: Patient identified, Emergency Drugs available, Suction available and Patient being monitored Patient Re-evaluated:Patient Re-evaluated prior to inductionOxygen Delivery Method: Circle system utilized Preoxygenation: Pre-oxygenation with 100% oxygen Intubation Type: IV induction Ventilation: Mask ventilation without difficulty Grade View: Grade II Tube type: Oral Tube size: 6.0 mm Number of attempts: 1 Airway Equipment and Method: Stylet,  Oral airway and Video-laryngoscopy Placement Confirmation: ETT inserted through vocal cords under direct vision,  positive ETCO2 and breath sounds checked- equal and bilateral Secured at: 22 cm Tube secured with: Tape Dental Injury: Teeth and Oropharynx as per pre-operative assessment

## 2016-05-08 NOTE — Transfer of Care (Signed)
Immediate Anesthesia Transfer of Care Note  Patient: Tiffany Hale  Procedure(s) Performed: Procedure(s): DIRECT LARYNGOSCOPY WITH LARYNGEAL NODULE BIOPSY (Left)  Patient Location: PACU  Anesthesia Type:General  Level of Consciousness: awake and alert   Airway & Oxygen Therapy: Patient Spontanous Breathing and Patient connected to face mask oxygen  Post-op Assessment: Report given to RN and Post -op Vital signs reviewed and stable  Post vital signs: Reviewed and stable  Last Vitals:  Vitals:   05/08/16 1045  BP: 137/61  Pulse: 84  Resp: 16  Temp: 37.9 C    Last Pain:  Vitals:   05/08/16 1045  TempSrc: Oral  PainSc: 4       Patients Stated Pain Goal: 2 ([id number])  Complications: No apparent anesthesia complications

## 2016-05-09 ENCOUNTER — Ambulatory Visit
Admission: RE | Admit: 2016-05-09 | Discharge: 2016-05-09 | Disposition: A | Discharge status: Home / Self Care | Payer: Medicaid Other | Source: Ambulatory Visit | Attending: Radiation Oncology | Admitting: Radiation Oncology

## 2016-05-09 ENCOUNTER — Telehealth: Payer: Self-pay | Admitting: *Deleted

## 2016-05-09 ENCOUNTER — Encounter (HOSPITAL_COMMUNITY): Payer: Self-pay | Admitting: Oncology

## 2016-05-09 ENCOUNTER — Encounter: Payer: Self-pay | Admitting: Radiation Oncology

## 2016-05-09 ENCOUNTER — Encounter (HOSPITAL_BASED_OUTPATIENT_CLINIC_OR_DEPARTMENT_OTHER): Payer: Self-pay | Admitting: Otolaryngology

## 2016-05-09 ENCOUNTER — Ambulatory Visit
Admission: RE | Admit: 2016-05-09 | Discharge: 2016-05-09 | Disposition: A | Discharge status: Home / Self Care | Payer: Medicaid Other | Source: Ambulatory Visit | Attending: Hematology & Oncology | Admitting: Hematology & Oncology

## 2016-05-09 ENCOUNTER — Encounter: Payer: Self-pay | Admitting: *Deleted

## 2016-05-09 VITALS — BP 134/56 | HR 71 | Temp 98.4°F | Ht 63.5 in | Wt 106.0 lb

## 2016-05-09 DIAGNOSIS — Z5111 Encounter for antineoplastic chemotherapy: Secondary | ICD-10-CM | POA: Diagnosis not present

## 2016-05-09 DIAGNOSIS — Z79891 Long term (current) use of opiate analgesic: Secondary | ICD-10-CM | POA: Insufficient documentation

## 2016-05-09 DIAGNOSIS — C321 Malignant neoplasm of supraglottis: Secondary | ICD-10-CM | POA: Insufficient documentation

## 2016-05-09 DIAGNOSIS — C139 Malignant neoplasm of hypopharynx, unspecified: Secondary | ICD-10-CM

## 2016-05-09 DIAGNOSIS — Z87891 Personal history of nicotine dependence: Secondary | ICD-10-CM | POA: Insufficient documentation

## 2016-05-09 DIAGNOSIS — Z90710 Acquired absence of both cervix and uterus: Secondary | ICD-10-CM | POA: Diagnosis not present

## 2016-05-09 DIAGNOSIS — Z85818 Personal history of malignant neoplasm of other sites of lip, oral cavity, and pharynx: Secondary | ICD-10-CM | POA: Insufficient documentation

## 2016-05-09 DIAGNOSIS — Z809 Family history of malignant neoplasm, unspecified: Secondary | ICD-10-CM | POA: Insufficient documentation

## 2016-05-09 DIAGNOSIS — R634 Abnormal weight loss: Secondary | ICD-10-CM

## 2016-05-09 DIAGNOSIS — C760 Malignant neoplasm of head, face and neck: Secondary | ICD-10-CM | POA: Diagnosis present

## 2016-05-09 DIAGNOSIS — Z9889 Other specified postprocedural states: Secondary | ICD-10-CM | POA: Insufficient documentation

## 2016-05-09 DIAGNOSIS — R221 Localized swelling, mass and lump, neck: Secondary | ICD-10-CM | POA: Insufficient documentation

## 2016-05-09 HISTORY — DX: Malignant neoplasm of hypopharynx, unspecified: C13.9

## 2016-05-09 LAB — GLUCOSE, CAPILLARY: GLUCOSE-CAPILLARY: 94 mg/dL (ref 65–99)

## 2016-05-09 MED ORDER — LARYNGOSCOPY SOLUTION RAD-ONC
15.0000 mL | Freq: Once | TOPICAL | Status: AC
  Administered 2016-05-09: 15 mL via TOPICAL

## 2016-05-09 MED ORDER — FLUDEOXYGLUCOSE F - 18 (FDG) INJECTION
13.0000 | Freq: Once | INTRAVENOUS | Status: AC | PRN
  Administered 2016-05-09: 12.31 via INTRAVENOUS

## 2016-05-09 NOTE — Progress Notes (Addendum)
Radiation Oncology         [phone] ________________________________  Initial outpatient Consultation  Name: Tiffany Hale MRN: [id number]  Date: 05/09/2016  DOB: 23-Jul-1955  MR:[id number], Tiffany Quint, NP  Tiffany Hale, Tiffany Fam, MD   REFERRING PHYSICIAN: Patrici Ranks, MD  DIAGNOSISPQ:[id number] Stage IVA squamous cell carcinoma, epiglottis   ICD-9-CM ICD-10-CM   1. Cancer, epiglottis (HCC) 161.1 C32.1 laryngocopy solution for Rad-Onc     Fiberoptic laryngoscopy     Ambulatory referral to Social Work     Ambulatory referral to Physical Therapy     Amb Referral to Nutrition and Diabetic E     Referral to Neuro Rehab     TSH    CHIEF COMPLAINT: Here to discuss management of laryngeal cancer  HISTORY OF PRESENT ILLNESS::Tiffany Hale is a 61 y.o. female who presented with soreness in the mouth, throat, and eventual hoarseness, dysphagia, and a left neck mass. She is a former smoker as of 2 yrs ago.  She does not chew tobacco or drink ETOH.   She ultimately had a CT of her neck on 1-11 showing a bulky supraglottic mass and left cervical node.  She has a biopsy of this mass recently by Dr Benjamine Mola showing invasive squamous cell carcinoma. Dr Tiffany Hale called me about this patient and is recommending chemotherapy.  Subsequently referrals were made for PET and consult with me.  Dr. Whitney Hale is ordering PEG tube and PAC.  Pt lives in [place] and has limited social support nearby.  Her children from [facility] are here today and appear quite supportive but can only be here two days per week.  She reports generalized weakness, weight loss, recent HA, mass in neck,  dysphagia especially with solids.  She denies prior cancers other than skin primary, and denies prior radiotherapy.   Pertinent imaging thus far includes CT as above and PET performed on today revealing hypermetabolic activity in left neck node and laryngeal mass. 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    PAST SURGICAL HISTORY: Past Surgical History:  Procedure Laterality Date  . BIOPSY N/A 07/21/2014   Procedure: BIOPSY;  Surgeon: Danie Binder, MD;  Location: [facility];  Service: Endoscopy;  Laterality: N/A;  Gastric  . COLONOSCOPY  OCT 2011    6 MM SIMPLE ADENOMA, BENIGN COLONIC MUCOSA  . ESOPHAGOGASTRODUODENOSCOPY  MAR 2011   Grade 1 varices, GASTRITIS  . ESOPHAGOGASTRODUODENOSCOPY (EGD) WITH PROPOFOL N/A 07/09/2012   SLF:The mucosa of the esophagus appeared normal/Small hiatal hernia/MODERATE Non-erosive gastritis (inflammation) with hemorrhage was found in the gastric antrum; multiple biopsies (reactive gastropathy). No H.pylori. No varices.Next EGD 07/2014.  Marland Kitchen ESOPHAGOGASTRODUODENOSCOPY (EGD) WITH PROPOFOL N/A 07/21/2014   SLF: 1. No esophageal , gastric, or duodanl varices seen. 2. Mild non-erosive gastritis.  Marland Kitchen ESOPHAGOGASTRODUODENOSCOPY (EGD) WITH PROPOFOL N/A 11/16/2015   Procedure: ESOPHAGOGASTRODUODENOSCOPY (EGD) WITH PROPOFOL;  Surgeon: Danie Binder, MD;  Location[facility];  Service: Endoscopy;  Laterality: N/A;  815  . Extremity Surgery     and Pelvic, after fall  . FOOT FRACTURE SURGERY    . FOOT SURGERY  Right 2009   [facility]-crushed foot with fall and it was repaired  . LARYNGOSCOPY Left 05/08/2016   Procedure: DIRECT LARYNGOSCOPY WITH LARYNGEAL MASS BIOPSY;  Surgeon: Leta Baptist, MD;  Location: [facility];  Service: ENT;  Laterality: Left;  . SAVORY DILATION N/A 11/16/2015   Procedure: SAVORY DILATION;  Surgeon: Danie Binder, MD;  [facility];  Service:  Endoscopy;  Laterality: N/A;  . SHOULDER ACROMIOPLASTY Left 03/01/2016   Procedure: SHOULDER ACROMIOPLASTY;  Surgeon: Earlie Server, MD;  Location: [facility];  Service: Orthopedics;  Laterality: Left;  . SHOULDER ARTHROSCOPY WITH OPEN ROTATOR CUFF REPAIR AND DISTAL CLAVICLE ACROMINECTOMY Left 03/01/2016   Procedure: SHOULDER ARTHROSCOPY WITH SUBACROMIAL DECOMPRESSION and debridement with acromioplasty,OPEN ROTATOR CUFF REPAIR AND DISTAL CLAVICULECTOMY;  Surgeon: Earlie Server, MD;  Location: [facility];  Service: Orthopedics;  Laterality: Left;  Pre/Post Op scalene block  . TOTAL ABDOMINAL HYSTERECTOMY  1995 DUB    FAMILY HISTORY: family history includes Birth defects in her brother; Cancer in her maternal grandfather and maternal grandmother; Cirrhosis in her father.  SOCIAL HISTORY:  reports that she quit smoking about 1 years ago. Her smoking use included Cigarettes. She has a 20.00 pack-year smoking history. She has never used smokeless tobacco. She reports that she does not drink alcohol or use drugs.  ALLERGIES: Aspirin; Codeine; and Nsaids  MEDICATIONS:  Current Outpatient Prescriptions  Medication Sig Dispense Refill  . ARIPiprazole (ABILIFY) 15 MG tablet Take 15 mg by mouth daily.      Marland Kitchen dicyclomine (BENTYL) 10 MG capsule Take 1 capsule (10 mg total) by mouth 4 (four) times daily -  before meals and at bedtime. As needed 90 capsule 1  . escitalopram (LEXAPRO) 20 MG tablet Take 30 mg by mouth daily.     . methylphenidate (CONCERTA) 36 MG CR tablet Take 36 mg by mouth every morning.      Marland Kitchen oxybutynin (DITROPAN-XL) 10 MG 24 hr tablet Take 10 mg by mouth daily.    Marland Kitchen oxyCODONE (OXY IR/ROXICODONE) 5 MG immediate release tablet Take 1 tablet (5 mg total) by mouth every 4 (four) hours as needed for severe pain. 90 tablet 0  . oxyCODONE (OXYCONTIN) 15 mg 12 hr tablet Take 1 tablet (15 mg total) by mouth every 12 (twelve) hours. 60 tablet 0  . traZODone  (DESYREL) 150 MG tablet Take 150 mg by mouth at bedtime.     . conjugated estrogens (PREMARIN) vaginal cream Use 1 gram a night (Patient not taking: Reported on 05/09/2016) 30 g 12  . omeprazole (PRILOSEC) 20 MG capsule TAKE 1 CAPSULE BY MOUTH EVERY MORNING (Patient not taking: Reported on 05/09/2016) 30 capsule 11   No current facility-administered medications for this encounter.     REVIEW OF SYSTEMS: A 10+ POINT REVIEW OF SYSTEMS WAS OBTAINED including neurology, dermatology, psychiatry, cardiac, respiratory, lymph, extremities, GI, GU, Musculoskeletal, constitutional, HEENT.  All pertinent positives are noted in the HPI.  All others are negative.Marland Kitchen   PHYSICAL EXAM:  height is 5' 3.5" (1.613 m) and weight is 106 lb (48.1 kg). Her temperature is 98.4 F (36.9 C). Her blood pressure is 134/56 (abnormal) and her pulse is 71. Her oxygen saturation is 98%.   General: Alert and oriented, in no acute distress / no stridor HEENT: Head is normocephalic. Extraocular movements are intact. Oropharynx is notable for edentulous, dentures removed, right posterior tongue slightly elevated. Neck: Neck is notable for 3.5cm left neck mass, level II Heart:  Regular in rate and rhythm with no murmurs, rubs, or gallops. Chest: Clear to auscultation bilaterally, with no rhonchi, wheezes, or rales. Abdomen: Soft, nontender, nondistended, with no rigidity or guarding. Extremities: No cyanosis or edema. Lymphatics: see Neck Exam Skin: No concerning lesions. Musculoskeletal: symmetric strength and muscle tone throughout. Neurologic: Cranial nerves II through XII are grossly intact. No obvious focalities. Speech is fluent. Coordination is intact. Psychiatric: Judgment and insight are intact. Affect is appropriate.  PROCEDURE NOTE: After obtaining consent and anesthetizing the nasal cavity with topical lidocaine and phenylephrine, the flexible endoscope was introduced and passed through the nasal cavity.  A fungating  bulky lobulated epiglottic mass was appreciated, bulkier on left side.  I could not pass beyond this mass to see the true cords or vallecula.   ECOG = 2  0 - Asymptomatic (Fully active, able to carry on all predisease activities without restriction)  1 - Symptomatic but completely ambulatory (Restricted in physically strenuous activity but ambulatory and able to carry out work of a light or sedentary nature. For example, light housework, office work)  2 - Symptomatic, <50% in bed during the day (Ambulatory and capable of all self care but unable to carry out any work activities. Up and about more than 50% of waking hours)  3 - Symptomatic, >50% in bed, but not bedbound (Capable of only limited self-care, confined to bed or chair 50% or more of waking hours)  4 - Bedbound (Completely disabled. Cannot carry on any self-care. Totally confined to bed or chair)  5 - Death   Eustace Pen MM, Creech RH, Tormey DC, et al. [phone]). "Toxicity and response criteria of the [facility]". [place] Oncol. 5 (6): 649-55   LABORATORY DATA:  Lab Results  Component Value Date   WBC 4.8 11/09/2015   HGB 10.8 (L) 11/09/2015   HCT 31.8 (L) 11/09/2015   MCV 85.5 11/09/2015   PLT 116 (L) 11/09/2015   CMP     Component Value Date/Time   NA 134 (L) 02/28/2016 1012   K 3.8 02/28/2016 1012   CL 99 (L) 02/28/2016 1012   CO2 27 02/28/2016 1012   GLUCOSE 114 (H) 02/28/2016 1012   BUN 11 02/28/2016 1012   CREATININE 0.55 02/28/2016 1012   CREATININE 0.65 11/09/2015 1527   CALCIUM 9.2 02/28/2016 1012   PROT 8.0 02/28/2016 1012   ALBUMIN 3.7 02/28/2016 1012   AST 18 02/28/2016 1012   ALT 15 02/28/2016 1012   ALKPHOS 66 02/28/2016 1012   BILITOT 0.8 02/28/2016 1012   GFRNONAA >60 02/28/2016 1012   GFRAA >60 02/28/2016 1012          RADIOGRAPHY: Ct Soft Tissue Neck W Contrast  Result Date: 04/20/2016 CLINICAL DATA:  Lump on left side neck EXAM: CT NECK WITH CONTRAST TECHNIQUE:  Multidetector CT imaging of the neck was performed using the standard protocol following the bolus administration of intravenous contrast. CONTRAST:  2mL ISOVUE-300 IOPAMIDOL (ISOVUE-300) INJECTION 61% COMPARISON:  None. FINDINGS: Pharynx and larynx: There is a mass lesion of the epiglottis, causing diffuse thickening of the epiglottis and filling the right aspect of the vallecula. On the left, the mass extends superiorly and abuts the root of the tongue. The mass measures approximately 2.3 x 1.2 by 1.7 cm. Additionally, there is a component of the mass that extends inferiorly to the left aryepiglottic fold and fills the left piriform sinus. The soft tissue thickening extends to the anterior commissure. There is no erosion of  the thyroid cartilage or hyoid bone. Salivary glands: The parotid and submandibular glands are normal. No sialolithiasis or salivary ductal dilatation. Thyroid: Normal Lymph nodes: At left level IIa, there is a massively enlarged lymph node measuring 3.8 x 3.0 cm with associated central necrosis. There are multiple subcentimeter nodes identified lower within the left cervical chain, but none are enlarged by CT size criteria. Vascular: The major cervical vessels are patent. Limited intracranial: Normal Visualized orbits: Normal Mastoids and visualized paranasal sinuses: Clear Skeleton: No lytic or blastic lesions. No bony spinal canal stenosis. Upper chest: Mild emphysema.  No pulmonary nodules or masses. Other: None IMPRESSION: 1. Multilobulated mass of the supraglottic larynx, epiglottis and hypopharynx, which involves the epiglottic cartilage and the left aryepiglottic fold and partially fills the vallecula and left piriform sinus. Superiorly, the mass extends along the left aspect of the hypopharynx adjacent to the base of tongue. Invasion into the tongue base would be difficult to exclude. 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 Recommend  correlation with direct visualization. 2. Enlarged necrotic left level IIa lymph node, which is in keeping with lymphatic spread of laryngeal squamous cell carcinoma. Electronically Signed   By: Ulyses Jarred M.D.   On: 04/20/2016 23:43   Nm Pet Image Initial (pi) Skull Base To Thigh  Result Date: 05/09/2016 CLINICAL DATA:  Initial treatment strategy for head and neck cancer. EXAM: NUCLEAR MEDICINE PET SKULL BASE TO THIGH TECHNIQUE: 12.31 mCi F-18 FDG was injected intravenously. Full-ring PET imaging was performed from the skull base to thigh after the radiotracer. CT data was obtained and used for attenuation correction and anatomic localization. FASTING BLOOD GLUCOSE:  Value: 94 mg/dl COMPARISON:  Neck CT dated 04/20/2016 FINDINGS: NECK Supraglottic mass extending from the left tonsillar sulcus (series 3/image 30) to the right aryepiglottic fold (series 3/ image 35) and inferiorly to the left piriform sinus (series 3/ image 38), better evaluated on recent CT neck. Max SUV 16.0. Associated 3.2 cm short axis centrally necrotic left level IIa lymph node (series 3/image 28), better evaluated on recent CT, max SUV 12.2. CHEST No suspicious pulmonary nodules. Mild emphysematous changes, upper lobe predominant. No hypermetabolic thoracic lymphadenopathy. ABDOMEN/PELVIS No abnormal hypermetabolic activity within the liver, pancreas, adrenal glands, or spleen. Cholelithiasis (series 3/image 162), without associated inflammatory changes. Atherosclerotic calcifications of the abdominal aorta and branch vessels. Moderate colonic stool burden. No hypermetabolic lymph nodes in the abdomen or pelvis. SKELETON No focal hypermetabolic activity to suggest skeletal metastasis. Postsurgical changes involving the left iliac wing. IMPRESSION: Hypermetabolic supraglottic mass, compatible with primary head and neck cancer, as described above. Associated left level IIa cervical nodal metastasis. No evidence of distal metastasis. These  results will be called to the ordering clinician or representative by the Radiologist Assistant, and communication documented in the PACS or zVision Dashboard. Electronically Signed   By: Julian Hy M.D.   On: 05/09/2016 15:17      IMPRESSION/PLAN: This is very nice patient with head and neck cancer. I do recommend radiotherapy for this patient.  We discussed the potential risks, benefits, and side effects of radiotherapy. We talked in detail about acute and late effects. We discussed that some of the most bothersome acute effects may be mucositis, dysgeusia, salivary changes, skin irritation, hair loss, dehydration, weight loss and fatigue. We talked about late effects which include but are not necessarily limited to dysphagia, hypothyroidism, nerve injury, spinal cord injury, xerostomia, trismus, and neck edema. No guarantees of treatment were  given. A consent form was signed and placed in the patient's medical record. The patient is enthusiastic about proceeding with treatment. I look forward to participating in the patient's care.    Simulation (treatment planning) will take place tomorrow.  We also discussed that the treatment of head and neck cancer is a multidisciplinary process to maximize treatment outcomes and quality of life. For this reasons the following referrals have been or will be made:   Medical oncology to discuss chemotherapy (this will be planned at [facility]) - defer to medical oncology on ordering PAC and PEG.  I will ask for the following to see her at the [facility] during multidisciplinary clinic, and possible nutritionist / social work to also contact or see her earlier.      Nutritionist for nutrition support during and after treatment.     Speech language pathology for swallowing and/or speech therapy. MBSS to precede this     Social work for social support.      Physical therapy due to risk of lymphedema in neck and deconditioning.  Baseline labs  including TSH. __________________________________________   Eppie Gibson, MD

## 2016-05-09 NOTE — Telephone Encounter (Signed)
Oncology Nurse Navigator Documentation  Called Tiffany Hale to confirm her 2:30 NE and 3:00 appts with Dr. Isidore Moos this afternoon after 11:30 PET at [facility].  She had me speak with son [name] who is driving her to these appts.  I provided info re [place] location, registration process upon arrival.  He voiced understanding.    [name], RN, BSN, [place] Oncology Nurse [place] at [place] [phone]

## 2016-05-09 NOTE — Progress Notes (Signed)
Oncology Nurse Navigator Documentation  Met with Ms Cottrell during initial consult with Dr. Isidore Moos.   She was accompanied by her son [name] and his Halfway who live in [place].  She lives with her elderly mother and sister who has just had foot surgery.  1. Further introduced myself as her Navigator, explained my role as a member of the Care Team.   2. Provided New Patient Information packet, discussed contents:  Contact information for physician(s), myself, other members of the Care Team.  She understands Shellia Carwin is her navigator while she receives chemo at [facility]; I provided Jennifer's phone number.  Advance Directive information ([place] blue pamphlet with LCSW contact info).  In addition proved [facility] AD document.  Fall Prevention Patient Safety Plan  Appointment Guideline  Financial Assistance Information sheet.  I am arranging financial counseling after tomorrow's CT SIM.  [place] with highlight of [place] 3. Provided introductory explanation of radiation treatment including SIM planning and purpose of Aquaplast head and shoulder mask, showed them example.   4. Provided and discussed education handouts for PEG and PAC.  5. Arranged CT SIM w/o contrast for tomorrow 1100. 6. Ms. Diprima noted transportation is a concern, that while she can drive herself, "I'm not sure where I need to go".  Levada Dy noted she will be available to help on T&Ws. 7. I encouraged them to contact me with questions/concerns as treatments/procedures begin.  Provided son with my contact information.  They verbalized understanding of information provided.    [name], RN, BSN, [place] Oncology Nurse [place] at [place] [phone]

## 2016-05-10 ENCOUNTER — Encounter: Payer: Self-pay | Admitting: *Deleted

## 2016-05-10 ENCOUNTER — Encounter (HOSPITAL_COMMUNITY): Payer: Self-pay

## 2016-05-10 ENCOUNTER — Ambulatory Visit
Admission: RE | Admit: 2016-05-10 | Discharge: 2016-05-10 | Disposition: A | Discharge status: Home / Self Care | Payer: Medicaid Other | Source: Ambulatory Visit | Attending: Radiation Oncology | Admitting: Radiation Oncology

## 2016-05-10 ENCOUNTER — Encounter (HOSPITAL_BASED_OUTPATIENT_CLINIC_OR_DEPARTMENT_OTHER): Payer: Medicaid Other | Admitting: Oncology

## 2016-05-10 ENCOUNTER — Encounter: Payer: Self-pay | Admitting: Radiation Oncology

## 2016-05-10 VITALS — BP 148/68 | HR 82 | Temp 98.1°F | Resp 18 | Wt 108.7 lb

## 2016-05-10 DIAGNOSIS — R070 Pain in throat: Secondary | ICD-10-CM | POA: Diagnosis not present

## 2016-05-10 DIAGNOSIS — C321 Malignant neoplasm of supraglottis: Secondary | ICD-10-CM

## 2016-05-10 DIAGNOSIS — M542 Cervicalgia: Secondary | ICD-10-CM

## 2016-05-10 DIAGNOSIS — C139 Malignant neoplasm of hypopharynx, unspecified: Secondary | ICD-10-CM

## 2016-05-10 DIAGNOSIS — F419 Anxiety disorder, unspecified: Secondary | ICD-10-CM

## 2016-05-10 DIAGNOSIS — G4700 Insomnia, unspecified: Secondary | ICD-10-CM

## 2016-05-10 DIAGNOSIS — Z5111 Encounter for antineoplastic chemotherapy: Secondary | ICD-10-CM | POA: Diagnosis not present

## 2016-05-10 MED ORDER — OXYCODONE HCL ER 40 MG PO T12A: 40.0000 mg | EXTENDED_RELEASE_TABLET | Freq: Two times a day (BID) | ORAL | 0 refills | Status: DC

## 2016-05-10 MED ORDER — ALPRAZOLAM 0.5 MG PO TABS: 0.5000 mg | ORAL_TABLET | Freq: Three times a day (TID) | ORAL | 0 refills | Status: DC | PRN

## 2016-05-10 NOTE — Progress Notes (Signed)
START ON PATHWAY REGIMEN - Head and Neck  [id number]: Cisplatin 40 mg/m2 Weekly with Concurrent Radiation for 6 - 7 Weeks   Administer weekly:     Cisplatin (Platinol(R)) 40 mg/m2 in 500 mL NS IV over 2 hours. *Prehydrate and consider post-hydration.* Dose Mod: None  **Always confirm dose/schedule in your pharmacy ordering system**    Patient Characteristics: Larynx, Stage III, IVA, IVB; Unresectable Disease Classification: Larynx AJCC T Category: T2 Current Disease Status: No Distant Mets or Local Recurrence AJCC 8 Stage Grouping: III AJCC N Category: cN1 AJCC M Category: M0  Intent of Therapy: Curative Intent, Discussed with Patient

## 2016-05-10 NOTE — Progress Notes (Addendum)
PROGRESS NOTE  [name], NP 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 Multilobulated mass of the supraglottic larynx, epiglottis and hypopharynx, which involves the epiglottic cartilage and the left aryepiglottic fold and partially fills the vallecula and left piriform sinus. Superiorly, the mass extends along the left aspect of the hypopharynx adjacent to the base of tongue. Invasion into the tongue base would be difficult to exclude. 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 Recommend correlation with direct visualization. 2. Enlarged necrotic left level IIa lymph node, which is in keeping with lymphatic spread of laryngeal squamous cell carcinoma.      05/08/2016 Procedure    Epiglottis biopsy, left by Dr. Benjamine Mola      05/09/2016 Pathology Results    Invasive squamous cell carcinoma, moderately to poorly differentiated.      05/09/2016 PET scan    Hypermetabolic supraglottic mass, compatible with primary head and neck cancer, as described above.  Associated left level IIa cervical nodal metastasis.  No evidence of distal metastasis.       CURRENT THERAPY:  INTERVAL HISTORY: Tiffany Hale 61 y.o. female is here because of referral by Barry Dienes NP in [place] for supraglottic laryngeal squamous cell carcinoma .  She presents today with her son for follow up of laryngeal cancer.   I have reviewed the PET scan with the patient; negative for distant metastatic disease. She saw her radiation oncologist yesterday. Her tentative start date for radiation is in about 10 days and she is  getting her CT simulation set up. She is experiencing some anxiety and difficulty sleeping due to agitation. She is maintaining her weight by using nutrient supplements like Boost. She continues to have pain in her throat and neck. Her pain is not well controlled by her current pain medication dosage. Denies any other concerns today.   MEDICAL HISTORY: Past Medical History:  Diagnosis Date  . Anxiety   . Arthritis    OA left shoulder  . Asthma   . BMI (body mass index) 20.0-29.9 2010 155 LBS  . Chronic back pain   . Cirrhosis ([place]) NOV 2011   MRI LIVER-5 SML DYSPLASTIC NODULES; 2009: TBILI 0.7 ALK PHOS 128 AST 56 ALT 53 ALB 3.8 INR 1.3  . 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 GERD (gastroesophageal reflux disease)   . HBV (hepatitis B virus) infection 2009: HBsAb POS  . HCV (hepatitis C virus) 2009   COMPLETED RX 2015-OCT 2015 VL UNDETECTABLE  . Hepatitis A 2009 Ab POS  . 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 Tobacco abuse     SURGICAL HISTORY: Past Surgical History:  Procedure Laterality Date  . BIOPSY N/A 07/21/2014   Procedure: BIOPSY;  Surgeon: Danie Binder, MD;  Location: [facility];  Service: Endoscopy;  Laterality: N/A;  Gastric  . COLONOSCOPY  OCT 2011    6 MM SIMPLE ADENOMA, BENIGN COLONIC MUCOSA  . ESOPHAGOGASTRODUODENOSCOPY  MAR 2011   Grade 1 varices, GASTRITIS  . ESOPHAGOGASTRODUODENOSCOPY (EGD) WITH PROPOFOL N/A 07/09/2012   SLF:The mucosa of the esophagus appeared normal/Small hiatal hernia/MODERATE Non-erosive  gastritis (inflammation) with hemorrhage was found in the gastric antrum; multiple biopsies (reactive gastropathy). No H.pylori. No varices.Next EGD 07/2014.  Marland Kitchen ESOPHAGOGASTRODUODENOSCOPY (EGD) WITH PROPOFOL N/A 07/21/2014   SLF: 1. No esophageal , gastric, or duodanl varices seen. 2. Mild non-erosive gastritis.  Marland Kitchen ESOPHAGOGASTRODUODENOSCOPY (EGD) WITH PROPOFOL N/A 11/16/2015   Procedure: ESOPHAGOGASTRODUODENOSCOPY (EGD) WITH  PROPOFOL;  Surgeon: Danie Binder, MD;  Location: [facility];  Service: Endoscopy;  Laterality: N/A;  815  . Extremity Surgery     and Pelvic, after fall  . FOOT FRACTURE SURGERY    . FOOT SURGERY Right 2009   [facility]-crushed foot with fall and it was repaired  . LARYNGOSCOPY Left 05/08/2016   Procedure: DIRECT LARYNGOSCOPY WITH LARYNGEAL MASS BIOPSY;  Surgeon: Leta Baptist, MD;  Location: [facility];  Service: ENT;  Laterality: Left;  . SAVORY DILATION N/A 11/16/2015   Procedure: SAVORY DILATION;  Surgeon: Danie Binder, MD;  Location[facility];  Service: Endoscopy;  Laterality: N/A;  . SHOULDER ACROMIOPLASTY Left 03/01/2016   Procedure: SHOULDER ACROMIOPLASTY;  Surgeon: Earlie Server, MD;  Location: [facility];  Service: Orthopedics;  Laterality: Left;  . SHOULDER ARTHROSCOPY WITH OPEN ROTATOR CUFF REPAIR AND DISTAL CLAVICLE ACROMINECTOMY Left 03/01/2016   Procedure: SHOULDER ARTHROSCOPY WITH SUBACROMIAL DECOMPRESSION and debridement with acromioplasty,OPEN ROTATOR CUFF REPAIR AND DISTAL CLAVICULECTOMY;  Surgeon: Earlie Server, MD;  Location: [facility];  Service: Orthopedics;  Laterality: Left;  Pre/Post Op scalene block  . TOTAL ABDOMINAL HYSTERECTOMY  1995 DUB    SOCIAL HISTORY: Social History   Social History  . Marital status: Divorced    Spouse name: N/A  . Number of children: N/A  . Years of education: N/A   Occupational History  . Not on file.   Social History Main Topics  . Smoking status: Former Smoker    Packs/day: 0.50    Years: 40.00    Types: Cigarettes    Quit date: 05/25/2014  . Smokeless tobacco: Never Used     Comment: 3 days to go through a pack   . Alcohol use No     Comment: history of ETOH abuse, no alcohol X 10 years   . Drug use: No  . Sexual activity: Not Currently   Other Topics Concern  . Not on file   Social History Narrative  . No narrative on file    FAMILY HISTORY: Family History  Problem  Relation Age of Onset  . Birth defects Brother   . Cancer Maternal Grandmother   . Cancer Maternal Grandfather   . Cirrhosis Father   . Colon cancer Neg Hx   . Colon polyps Neg Hx     Review of Systems  Constitutional: Negative.  Negative for weight loss.  HENT:       Throat and neck pain  Eyes: Negative.   Respiratory: Negative.   Cardiovascular: Negative.   Gastrointestinal: Negative.   Genitourinary: Negative.   Musculoskeletal: Negative.   Skin: Negative.   Neurological: Negative.   Endo/Heme/Allergies: Negative.   Psychiatric/Behavioral: The patient is nervous/anxious and has insomnia.   All other systems reviewed and are negative.   PHYSICAL EXAMINATION  ECOG PERFORMANCE STATUS: 1-2  Vitals:   05/10/16 0828  BP: (!) 148/68  Pulse: 82  Resp: 18  Temp: 98.1 F (36.7 C)   Physical Exam  Constitutional: She is oriented to person, place, and time and well-developed, well-nourished, and in no distress.  Thin cachectic female  HENT:  Head: Normocephalic and atraumatic.  Eyes: EOM are normal. Pupils are equal, round, and reactive to light.  Neck: Normal range of motion. Neck supple.  Large palpable 3 cm level 2 lymph node on L neck No palpable neck mass.   Cardiovascular: Normal rate, regular rhythm and normal heart sounds.   Pulmonary/Chest: Effort normal and breath sounds normal.  Abdominal: Soft. Bowel sounds are normal.  Musculoskeletal: Normal range of motion.  Lymphadenopathy:    She has no cervical adenopathy.  Neurological: She is alert and oriented to person, place, and time. Gait normal.  Skin: Skin is warm and dry.  Nursing note and vitals reviewed.   LABORATORY DATA:  CBC    Component Value Date/Time   WBC 4.8 11/09/2015 1527   RBC 3.72 (L) 11/09/2015 1527   HGB 10.8 (L) 11/09/2015 1527   HCT 31.8 (L) 11/09/2015 1527   PLT 116 (L) 11/09/2015 1527   MCV 85.5 11/09/2015 1527   MCH 29.0 11/09/2015 1527   MCHC 34.0 11/09/2015 1527   RDW  13.4 11/09/2015 1527   LYMPHSABS 768 (L) 11/09/2015 1527   MONOABS 432 11/09/2015 1527   EOSABS 48 11/09/2015 1527   BASOSABS 0 11/09/2015 1527    CMP     Component Value Date/Time   NA 134 (L) 02/28/2016 1012   K 3.8 02/28/2016 1012   CL 99 (L) 02/28/2016 1012   CO2 27 02/28/2016 1012   GLUCOSE 114 (H) 02/28/2016 1012   BUN 11 02/28/2016 1012   CREATININE 0.55 02/28/2016 1012   CREATININE 0.65 11/09/2015 1527   CALCIUM 9.2 02/28/2016 1012   PROT 8.0 02/28/2016 1012   ALBUMIN 3.7 02/28/2016 1012   AST 18 02/28/2016 1012   ALT 15 02/28/2016 1012   ALKPHOS 66 02/28/2016 1012   BILITOT 0.8 02/28/2016 1012   GFRNONAA >60 02/28/2016 1012   GFRAA >60 02/28/2016 1012    RADIOGRAPHIC STUDIES: I have reviewed the images below and agree with the reported results  Nm Pet Image Initial (pi) Skull Base To Thigh  Result Date: 05/09/2016 CLINICAL DATA:  Initial treatment strategy for head and neck cancer. EXAM: NUCLEAR MEDICINE PET SKULL BASE TO THIGH TECHNIQUE: 12.31 mCi F-18 FDG was injected intravenously. Full-ring PET imaging was performed from the skull base to thigh after the radiotracer. CT data was obtained and used for attenuation correction and anatomic localization. FASTING BLOOD GLUCOSE:  Value: 94 mg/dl COMPARISON:  Neck CT dated 04/20/2016 FINDINGS: NECK Supraglottic mass extending from the left tonsillar sulcus (series 3/image 30) to the right aryepiglottic fold (series 3/ image 35) and inferiorly to the left piriform sinus (series 3/ image 38), better evaluated on recent CT neck. Max SUV 16.0. Associated 3.2 cm short axis centrally necrotic left level IIa lymph node (series 3/image 28), better evaluated on recent CT, max SUV 12.2. CHEST No suspicious pulmonary nodules. Mild emphysematous changes, upper lobe predominant. No hypermetabolic thoracic lymphadenopathy. ABDOMEN/PELVIS No abnormal hypermetabolic activity within the liver, pancreas, adrenal glands, or spleen. Cholelithiasis  (series 3/image 162), without associated inflammatory changes. Atherosclerotic calcifications of the abdominal aorta and branch vessels. Moderate colonic stool burden. No hypermetabolic lymph nodes in the abdomen or pelvis. SKELETON No focal hypermetabolic activity to suggest skeletal metastasis. Postsurgical changes involving the left iliac wing. IMPRESSION: Hypermetabolic supraglottic mass, compatible with primary head and neck cancer, as described above. Associated left level IIa cervical nodal metastasis. No evidence of distal metastasis. These results will be called to the ordering clinician or representative by  the Radiologist Assistant, and communication documented in the PACS or zVision Dashboard. Electronically Signed   By: Julian Hy M.D.   On: 05/09/2016 15:17    ASSESSMENT and THERAPY PLAN:   PET reviewed with the patient and her family today.  I have put in orders for port and peg placement in [place] next week. Spoke to our dietitian about the patient to follow along.  Treat with concurrent chemotherapy with Cisplatin 40 mg/m^2 weekly with radiation for 7 weeks. I do not believe patient will be able to tolerate high dose cisplatin q3weeks at this time, therefore I will plan for the weekly cisplatin instead.   Rad-onc is planning for 7 weeks of radiation, from 05/24/16-07/11/16. I have set up cycle 1 of cisplatin for February 14. Chemotherapy education through nurse navigator.   Increased OxyContin to 40 mg. Prescribed Xanax for anxiety.  She will return for follow up in 3 weeks.   Orders Placed This Encounter  Procedures  . IR Fluoro Guide CV Line Left    Standing Status:   Future    Standing Expiration Date:   07/08/2017    Order Specific Question:   Reason for Exam (SYMPTOM  OR DIAGNOSIS REQUIRED)    Answer:   port placement for chemo for head and neck cancer    Order Specific Question:   Preferred Imaging Location?    Answer:   [facility]    Standing Status:   Future    Standing Expiration Date:   07/08/2017    Order Specific Question:   Reason for Exam (SYMPTOM  OR DIAGNOSIS REQUIRED)    Answer:   PEG for patient with head and neck cancer    Order Specific Question:   Preferred Imaging Location?    Answer:   [facility]    All questions were answered. The patient knows to call the clinic with any problems, questions or concerns. We can certainly see the patient much sooner if necessary. This note was electronically signed.  I spent 40 minutes counseling the patient face to face. The total time spent in the appointment was 40 minutes and more than 50% was on counseling and review of test results  This document serves as a record of services personally performed by Twana First, MD. It was created on her behalf by Martinique Casey, a trained medical scribe. The creation of this record is based on the scribe's personal observations and the provider's statements to them. This document has been checked and approved by the attending provider.  I have reviewed the above documentation for accuracy and completeness and I agree with the above.  Martinique Sanda Klein 05/10/2016

## 2016-05-10 NOTE — Progress Notes (Signed)
Head and Neck Cancer Simulation, IMRT treatment planning, and Special treatment procedure note   Outpatient  Diagnosis:    ICD-9-CM ICD-10-CM   1. 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 An Aquaplast head and shoulder mask was custom fitted to the patient's anatomy. High-resolution CT axial imaging was obtained of the head and neck without contrast. I verified that the quality of the imaging is good for treatment planning. 1 Medically Necessary Treatment Device was fabricated and supervised by me: Aquaplast mask.  Treatment planning note I plan to treat the patient with IMRT. I plan to treat the patient's tumor and bilateral neck nodes. I plan to treat to a total dose of 70 Gray in 35  fractions. Dose calculation was ordered from dosimetry.  IMRT planning Note  IMRT is medically necessary and an important modality to deliver adequate dose to the patient's at risk tissues while sparing the patient's normal structures, including the: esophagus, parotid tissue, mandible, brain stem, spinal cord, oral cavity, brachial plexus.  This justifies the use of IMRT in the patient's treatment.   Special Treatment Procedure Note:  The patient will be receiving chemotherapy concurrently. Chemotherapy heightens the risk of side effects. I have considered this during the patient's treatment planning process and will monitor the patient accordingly for side effects on a weekly basis. Concurrent chemotherapy increases the complexity of this patient's treatment and therefore this constitutes a special treatment procedure.  -----------------------------------  Eppie Gibson, MD

## 2016-05-10 NOTE — Progress Notes (Signed)
Financial Counseling--spoke with patient and family today about financial issues--she is going to bring in her wage verifications to see if she may qualify for [employer]

## 2016-05-10 NOTE — Patient Instructions (Signed)
[place] at [facility] Discharge Instructions  RECOMMENDATIONS MADE BY THE CONSULTANT AND ANY TEST RESULTS WILL BE SENT TO YOUR REFERRING PHYSICIAN.  You were seen today by Dr. Barron Schmid We will refer you to Dr. Arnoldo Morale for port-a-cath placement and PEG tube placement Shellia Carwin, RN is our patient nurse navigator/educator.  She will be in touch with you regarding chemotherapy teaching.  Follow up in 3 weeks  Thank you for choosing [place] at [facility] to provide your oncology and hematology care.  To afford each patient quality time with our provider, please arrive at least 15 minutes before your scheduled appointment time.    If you have a lab appointment with the [facility] please come in thru the  Main Entrance and check in at the main information desk  You need to re-schedule your appointment should you arrive 10 or more minutes late.  We strive to give you quality time with our providers, and arriving late affects you and other patients whose appointments are after yours.  Also, if you no show three or more times for appointments you may be dismissed from the clinic at the providers discretion.     Again, thank you for choosing [facility].  Our hope is that these requests will decrease the amount of time that you wait before being seen by our physicians.       _____________________________________________________________  Should you have questions after your visit to [facility], please contact our office at [phone] between the hours of 8:30 a.m. and 4:30 p.m.  Voicemails left after 4:30 p.m. will not be returned until the following business day.  For prescription refill requests, have your pharmacy contact our office.       Resources For Cancer Patients and their Caregivers ? American Cancer Society: Can assist with transportation, wigs, general needs, runs Look Good Feel Better.         305-345-4229 ? Cancer Care: Provides financial assistance, online support groups, medication/co-pay assistance.  1-800-813-HOPE (818)528-9985) ? [facility] Assists [place] cancer patients and their families through emotional , educational and financial support.  (208)031-3932 ? [place] DSS Where to apply for food stamps, Medicaid and utility assistance. 862-361-1290 ? [facility]: Transportation to medical appointments. 574-636-1033 ? Social Security Administration: May apply for disability if have a Stage IV cancer. (949)440-9397 410-324-3114 ? [employer], Disability and Transit Services: Assists with nutrition, care and transit needs. [place] Support Programs: @10RELATIVEDAYS @ > Cancer Support Group  2nd Tuesday of the month 1pm-2pm, Journey Room  > Creative Journey  3rd Tuesday of the month 1130am-1pm, Journey Room  > Look Good Feel Better  1st Wednesday of the month 10am-12 noon, Journey Room (Call [place] to register [phone])

## 2016-05-10 NOTE — Progress Notes (Signed)
Oncology Nurse Navigator Documentation  Met with [name] for her CT [facility].  She was accompanied by her son and his SO. She was fitted with open-faced mask, tolerated procedure without incident. I showed her [place] treatment area, explained registration and arrival procedures.  She voiced understanding. [name] and family understand I can be contacted with needs/concerns.  [name], RN, BSN, [place] Oncology Nurse [place] at [place] [phone]

## 2016-05-11 ENCOUNTER — Telehealth (HOSPITAL_COMMUNITY): Payer: Self-pay | Admitting: Emergency Medicine

## 2016-05-11 ENCOUNTER — Ambulatory Visit (INDEPENDENT_AMBULATORY_CARE_PROVIDER_SITE_OTHER): Payer: Medicaid Other | Admitting: Nurse Practitioner

## 2016-05-11 ENCOUNTER — Encounter: Payer: Self-pay | Admitting: Nurse Practitioner

## 2016-05-11 VITALS — BP 144/80 | HR 108 | Temp 98.4°F | Ht 64.0 in | Wt 105.8 lb

## 2016-05-11 DIAGNOSIS — K5900 Constipation, unspecified: Secondary | ICD-10-CM

## 2016-05-11 DIAGNOSIS — K7469 Other cirrhosis of liver: Secondary | ICD-10-CM | POA: Diagnosis not present

## 2016-05-11 DIAGNOSIS — B1920 Unspecified viral hepatitis C without hepatic coma: Secondary | ICD-10-CM | POA: Diagnosis not present

## 2016-05-11 DIAGNOSIS — R634 Abnormal weight loss: Secondary | ICD-10-CM | POA: Diagnosis not present

## 2016-05-11 MED ORDER — LIDOCAINE-PRILOCAINE 2.5-2.5 % EX CREA: TOPICAL_CREAM | CUTANEOUS | 3 refills | Status: DC

## 2016-05-11 MED ORDER — LUBIPROSTONE 8 MCG PO CAPS: 8.0000 ug | ORAL_CAPSULE | Freq: Two times a day (BID) | ORAL | 0 refills | Status: DC

## 2016-05-11 MED ORDER — DEXAMETHASONE 4 MG PO TABS: ORAL_TABLET | ORAL | 1 refills | Status: DC

## 2016-05-11 MED ORDER — PROCHLORPERAZINE MALEATE 10 MG PO TABS: 10.0000 mg | ORAL_TABLET | Freq: Four times a day (QID) | ORAL | 1 refills | Status: DC | PRN

## 2016-05-11 MED ORDER — ONDANSETRON HCL 8 MG PO TABS: 8.0000 mg | ORAL_TABLET | Freq: Two times a day (BID) | ORAL | 1 refills | Status: DC | PRN

## 2016-05-11 NOTE — Assessment & Plan Note (Signed)
Complains of constipation. Takes a stool softener daily which is not effective. MiraLAX causes diarrhea. I will trial her on Amitiza 8 g twice a day. She is to call some 1-2 weeks with a progress report.

## 2016-05-11 NOTE — Progress Notes (Signed)
Referring Provider: Ginger Organ Primary Care Physician:  [name], NP Primary GI:  Dr. Oneida Alar  Chief Complaint  Patient presents with  . Cirrhosis    f/u, doing ok. "?port for feeding tube" seeing Dr. Arnoldo Morale today    HPI:   Tiffany Hale is a 61 y.o. 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 The patient was last seen in our office 11/09/2015 at which point she stated she was doing well overall. Generally asymptomatic from a hepatic standpoint. Bentyl helped her previous abdominal pain. Her main concern was dysphagia and previously had a problem years prior. Last EGD 2016 with no varices, mild gastritis, no mention of stricture, web, or ring. Labs and abdominal imaging are ordered. CBC with mild anemia at 10.8, mildly low platelet count at 116. CMP normal from a renal and hepatic standpoint. AFP tumor marker normal. INR normal at 1.1. Right upper quadrant abdominal ultrasound found known gallstones without cholecystitis, mild parenchymal heterogenicity and surface contour irregularities consistent with cirrhosis with mild left lobe and caudate lobe prominence but no hepatic masses. Overall her disease is well compensated. 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 Tentatively starting radiation in 10 days and an order was placed for port and PEG placement in [place] next week. Plan to treat with chemotherapy for 7 weeks.  Today she states she's beign treated for cancer. She is seeing surgeon today to schedule port and PEG placement. She is losing weight. Difficulty swallowing and painful swallowing.Associated weight loss due to sore throat and neck CA. Is relying primarily on Boost but is  "getting sick of it." Discussed other options. Denies hematochezia, melena, yellowing of skin/eyes, abdominal pain. She is constipated. Uses stool softeners already, MiraLAX overshoots. Denies acute episodic confusion, tremors. Denies chest pain, dyspnea, dizziness, lightheadedness, syncope, near syncope. Denies any other upper or lower GI symptoms.   Past Medical History:  Diagnosis Date  . Anxiety   . Arthritis    OA left shoulder  . Asthma   . BMI (body mass index) 20.0-29.9 2010 155 LBS  . Chronic back pain   . Cirrhosis ([place]) NOV 2011   MRI LIVER-5 SML DYSPLASTIC NODULES; 2009: TBILI 0.7 ALK PHOS 128 AST 56 ALT 53 ALB 3.8 INR 1.3  . 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 GERD (gastroesophageal reflux disease)   . HBV (hepatitis B virus) infection 2009: HBsAb POS  . HCV (hepatitis C virus) 2009   COMPLETED RX 2015-OCT 2015 VL UNDETECTABLE  . Hepatitis A 2009 Ab POS  . Squamous cell cancer of hypopharynx ([place]) 05/09/2016  . Tobacco abuse     Past Surgical History:  Procedure Laterality Date  . BIOPSY N/A 07/21/2014   Procedure: BIOPSY;  Surgeon: Danie Binder, MD;  Location: [facility];  Service: Endoscopy;  Laterality: N/A;  Gastric  . COLONOSCOPY  OCT 2011    6 MM SIMPLE ADENOMA, BENIGN COLONIC MUCOSA  . ESOPHAGOGASTRODUODENOSCOPY  MAR 2011   Grade 1 varices, GASTRITIS  . ESOPHAGOGASTRODUODENOSCOPY (EGD) WITH PROPOFOL N/A 07/09/2012   SLF:The mucosa of the esophagus appeared normal/Small hiatal hernia/MODERATE Non-erosive gastritis (inflammation) with hemorrhage was found in the gastric antrum; multiple biopsies (reactive gastropathy). No H.pylori.  No varices.Next EGD 07/2014.  Marland Kitchen ESOPHAGOGASTRODUODENOSCOPY (EGD) WITH PROPOFOL N/A 07/21/2014   SLF: 1. No esophageal , gastric, or duodanl varices seen. 2. Mild non-erosive gastritis.  Marland Kitchen ESOPHAGOGASTRODUODENOSCOPY (EGD) WITH PROPOFOL N/A 11/16/2015   Procedure: ESOPHAGOGASTRODUODENOSCOPY (EGD) WITH PROPOFOL;  Surgeon: Danie Binder, MD;  Location: [facility];  Service: Endoscopy;  Laterality: N/A;  815  . Extremity Surgery     and Pelvic, after fall  . FOOT FRACTURE SURGERY    . FOOT SURGERY Right 2009   [facility]-crushed foot with fall and it was repaired  . LARYNGOSCOPY Left 05/08/2016   Procedure: DIRECT LARYNGOSCOPY WITH LARYNGEAL MASS BIOPSY;  Surgeon: Leta Baptist, MD;  Location: [facility];  Service: ENT;  Laterality: Left;  . SAVORY DILATION N/A 11/16/2015   Procedure: SAVORY DILATION;  Surgeon: Danie Binder, MD;  Location: [facility];  Service: Endoscopy;  Laterality: N/A;  . SHOULDER ACROMIOPLASTY Left 03/01/2016   Procedure: SHOULDER ACROMIOPLASTY;  Surgeon: Earlie Server, MD;  Location: [facility];  Service: Orthopedics;  Laterality: Left;  . SHOULDER ARTHROSCOPY WITH OPEN ROTATOR CUFF REPAIR AND DISTAL CLAVICLE ACROMINECTOMY Left 03/01/2016   Procedure: SHOULDER ARTHROSCOPY WITH SUBACROMIAL DECOMPRESSION and debridement with acromioplasty,OPEN ROTATOR CUFF REPAIR AND DISTAL CLAVICULECTOMY;  Surgeon: Earlie Server, MD;  Location: [facility];  Service: Orthopedics;  Laterality: Left;  Pre/Post Op scalene block  . TOTAL ABDOMINAL HYSTERECTOMY  1995 DUB    Current Outpatient Prescriptions  Medication Sig Dispense Refill  . ALPRAZolam (XANAX) 0.5 MG tablet Take 1 tablet (0.5 mg total) by mouth 3 (three) times daily as needed for anxiety or sleep. 90 tablet 0  . ARIPiprazole (ABILIFY) 15 MG tablet Take 15 mg by mouth daily.      Marland Kitchen conjugated estrogens (PREMARIN) vaginal cream Use 1 gram a night (Patient not taking: Reported on 05/09/2016) 30 g 12  . dexamethasone (DECADRON) 4 MG tablet Take 2 tablets by mouth once a day on the day after chemotherapy and then take 2 tablets two times a day for 2 days. Take with food. 30 tablet 1  . dicyclomine (BENTYL) 10 MG capsule Take 1 capsule (10 mg total) by mouth 4 (four) times daily -  before meals and at bedtime. As needed  90 capsule 1  . escitalopram (LEXAPRO) 20 MG tablet Take 30 mg by mouth daily.     Marland Kitchen lidocaine-prilocaine (EMLA) cream Apply to affected area once 30 g 3  . methylphenidate (CONCERTA) 36 MG CR tablet Take 36 mg by mouth every morning.      Marland Kitchen omeprazole (PRILOSEC) 20 MG capsule TAKE 1 CAPSULE BY MOUTH EVERY MORNING (Patient not taking: Reported on 05/09/2016) 30 capsule 11  . ondansetron (ZOFRAN) 8 MG tablet Take 1 tablet (8 mg total) by mouth 2 (two) times daily as needed. Start on the third day after chemotherapy. 30 tablet 1  . oxybutynin (DITROPAN-XL) 10 MG 24 hr tablet Take 10 mg by mouth daily.    Marland Kitchen oxyCODONE (OXY IR/ROXICODONE) 5 MG immediate release tablet Take 1 tablet (5 mg total) by mouth every 4 (four) hours as needed for severe pain. 90 tablet 0  . oxyCODONE (OXYCONTIN) 40 mg 12 hr tablet Take 1 tablet (40 mg total) by mouth every 12 (twelve) hours. 60 tablet 0  . prochlorperazine (COMPAZINE) 10 MG tablet Take 1 tablet (10 mg total) by mouth every 6 (six) hours as needed (Nausea or vomiting). 30 tablet 1  . traZODone (DESYREL) 150  MG tablet Take 150 mg by mouth at bedtime.      No current facility-administered medications for this visit.     Allergies as of 05/11/2016 - Review Complete 05/10/2016  Allergen Reaction Noted  . Aspirin Nausea And Vomiting 10/02/2012  . Codeine Other (See Comments) 10/02/2012  . Nsaids Nausea And Vomiting 10/02/2012    Family History  Problem Relation Age of Onset  . Birth defects Brother   . Cancer Maternal Grandmother   . Cancer Maternal Grandfather   . Cirrhosis Father   . Colon cancer Neg Hx   . Colon polyps Neg Hx     Social History   Social History  . Marital status: Divorced    Spouse name: N/A  . Number of children: N/A  . Years of education: N/A   Social History Main Topics  . Smoking status: Former Smoker    Packs/day: 0.50    Years: 40.00    Types: Cigarettes    Quit date: 05/25/2014  . Smokeless tobacco: Never Used      Comment: 3 days to go through a pack   . Alcohol use No     Comment: history of ETOH abuse, no alcohol X 10 years   . Drug use: No  . Sexual activity: Not Currently   Other Topics Concern  . Not on file   Social History Narrative  . No narrative on file    Review of Systems: Complete ROS negative except as per HPI.   Physical Exam: There were no vitals taken for this visit. General:   Alert and oriented. Pleasant and cooperative. Well-nourished and well-developed.  Head:  Normocephalic and atraumatic. Eyes:  Without icterus, sclera clear and conjunctiva pink.  Ears:  Normal auditory acuity. Mouth:  No deformity or lesions, oral mucosa pink.  Throat/Neck:  Supple, without mass or thyromegaly. Cardiovascular:  S1, S2 present without murmurs appreciated. Normal pulses noted. Extremities without clubbing or edema. Respiratory:  Clear to auscultation bilaterally. No wheezes, rales, or rhonchi. No distress.  Gastrointestinal:  +BS, soft, non-tender and non-distended. No HSM noted. No guarding or rebound. No masses appreciated.  Rectal:  Deferred  Musculoskalatal:  Symmetrical without gross deformities. Normal posture. Skin:  Intact without significant lesions or rashes. Neurologic:  Alert and oriented x4;  grossly normal neurologically. Psych:  Alert and cooperative. Normal mood and affect. Heme/Lymph/Immune: No significant cervical adenopathy. No excessive bruising noted.    05/11/2016 9:10 AM   Disclaimer: This note was dictated with voice recognition software. Similar sounding words can inadvertently be transcribed and may not be corrected upon review.

## 2016-05-11 NOTE — Assessment & Plan Note (Signed)
Significant weight loss due to neck cancer and associated sore throat with difficulty swallowing. Is relying primarily on boost at this point. She is getting sick of taking boost. I discussed other options such as [employer], a different flavor of a supplement, possibly making milkshakes out of boost and ice cream to help alter the flavor and the cold sensation helping her throat pain. She is due for PEG placement wall her port is being placed, presumably in the next week. This will allow supplemental nutrition as needed. Return for follow-up in 6 months.

## 2016-05-11 NOTE — Assessment & Plan Note (Signed)
Well compensated disease. Today we will update her labs and imaging. She is to call us if she is having any worsening problems. Generally asymptomatic from a hepatic standpoint. Return for follow-up in 6 months.

## 2016-05-11 NOTE — Patient Instructions (Signed)
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  We are going to treat you with 7 weekly treatments of cisplatin.  You will have concurrent radiation treatment while you are receiving chemotherapy. This treatment is with curative intent.   You will also come to the cancer center to receive fluids twice a week to help keep you hydrated during treatment.  You will have 2 hours of IV fluids given prior to receiving Cisplatin and 2 hours of fluids given after Cisplatin. This will help protect your kidneys.  You will see the doctor regularly throughout treatment.  We monitor your lab work prior to every treatment.  The doctor monitors your response to treatment by the way you are feeling, your blood work, and scans periodically.   You will receive the following premedications prior to receiving chemotherapy: Premeds: Aloxi - high powered nausea/vomiting prevention medication used for chemotherapy patients. Emend - high powered nausea/vomiting prevention medication used for chemotherapy patients. Dexamethasone - steroid - given to reduce the risk of you having an allergic type reaction to the chemotherapy. Dex can cause you to feel energized, nervous/anxious/jittery, make you have trouble sleeping, and/or make you feel hot/flushed in the face/neck and/or look pink/red in the face/neck. These side effects will pass as the Dex wears off. (takes 20 minutes to infuse)   POTENTIAL SIDE EFFECTS OF TREATMENT:  Cisplatin (Generic Name) Other Names: Platinol, platinum  About This Drug Cisplatin is a drug used to treat cancer. This drug is given in the vein (IV).  This drug takes 1 hour to infuse.  Possible Side Effects (More Common) . This drug may affect how your kidneys work. Your kidney function will be checked as needed. . Electrolyte changes. Your blood will be checked for electrolyte changes as  needed. . High-frequency hearing loss may occur. You will get IV fluids before and during the Cisplatin infusion to help prevent this. You may also get ringing in the ears. . Bone marrow depression. This is a decrease in the number of white blood cells, red blood cells, and platelets. This may raise your risk of infection, make you tired and weak (fatigue), and raise your risk of bleeding. . Nausea and throwing up (vomiting). These symptoms may happen within a few hours after your treatment and may last for a few days to a week. Medicines are available to stop or lessen these side effects.  Possible Side Effects (Less Common) . Effects on the nerves are called peripheral neuropathy. You may feel numbness or pain in your hands and feet. It may be hard for you to button your clothes, open jars, or walk as usual. The effect on the nerves may get worse with more doses of the drug. These effects get better in some people after the drug is stopped, but it does not get better in all people. Marland Kitchen Blurred vision or other changes in eyesight. . Soreness of the mouth and throat. You may have red areas, white patches, or sores that hurt. . Hair loss. You may notice your hair getting thin. Some patients lose their hair. Your hair often grows back when treatment is done.  Allergic Reactions Allergic reactions to this drug are rare, but may happen in some patients. Signs of allergic reactions to this drug may be a rash, fever, chills, feeling dizzy, trouble breathing, and/or feeling that your heart is beating in a fast or not normal way.  Treating Side Effects . Drink 6-8 cups of fluids each day unless your doctor has told you to limit your fluid intake due to some other health problem. A cup is 8 ounces of fluid. If you throw up or have loose bowel movements you should drink more fluids so that you do not become dehydrated (lack water in the body due to losing too much fluid). . If you have numbness and tingling in  your hands and feet, be careful when cooking, walking, and handling sharp objects and hot liquids. . Mouth care is very important. Your mouth care should consist of routine, gentle cleaning of your teeth or dentures and rinsing your mouth with a mixture of 1/2 teaspoon of salt in 8 ounces of water or  teaspoon of baking soda in 8 ounces of water. This should be done at least after each meal and at bedtime. . If you have mouth sores, avoid mouthwash that has alcohol. Also avoid alcohol and smoking because they can bother your mouth and throat. . Talk with your nurse about getting a wig before you lose your hair. Also, call the [facility] at [phone]-2345 to find out information about the "Look Good, Feel Better" program close to where you live. It is a free program where women getting chemotherapy can learn about wigs, turbans and scarves as well as makeup techniques and skin and nail care.  Food and Drug Interactions There are no known interactions of Cisplatin with food. This drug may interact with other medicines. Tell your doctor and pharmacist about all the medicines and dietary supplements (vitamins, minerals, herbs and others) that you are taking at this time. The safety and use of dietary supplements and alternative diets are often not known. Using these might affect your cancer or interfere with your treatment. Until more is known, you should not use dietary supplements or alternative diets without your cancer doctor's help.  When to Call the Doctor Call your doctor or nurse right away if you have any of these symptoms: . Rash or itching . Feeling dizzy or lightheaded . Wheezing or trouble breathing . Swelling of the face . Fever of 100.5 F (38 C) or above . Chills . Easy bleeding or bruising . Decreased urine . Weight gain of 5 pounds in one week (fluid retention) . Nausea that stops you from eating or drinking . Throwing up more than 3 times a day Call your doctor or nurse  as soon as possible if you have these symptoms: . Numbness, tingling, decreased feeling or weakness in fingers, toes, arms, or legs . Trouble walking or changes in the way you walk, feeling clumsy when buttoning clothes, opening jars, or other routine hand motions . Blurred vision or other changes in eyesight . Changes in hearing, ringing in the ears . Pain in your mouth or throat that makes it hard to eat or drink . Fatigue that interferes with your daily activities  Sexual Problems and Reproductive Concerns . Infertility warning: Sexual problems and reproduction concerns may occur. In both men and women, this drug may affect your ability to have children. This cannot be determined before your treatment. Speak with your doctor or nurse if you plan to have children. Ask for information on sperm or egg banking. . In men, this drug may interfere with your ability to make sperm, but it should not change your ability to have sexual relations. . In women, menstrual bleeding may become irregular or stop while you are receiving this  drug. Do not assume that you cannot become pregnant if you do not have a menstrual period. . Women may experience signs of menopause like vaginal dryness, itching, and pain during sexual relations . Genetic counseling is available for you to talk about the effects of this drug therapy on future pregnancies. Also, a genetic counselor can look at the possible risk of problems in the unborn baby due to this medicine if an exposure happens during pregnancy. . Pregnancy warning: The drug may have harmful effects on the unborn child, so effective methods of birth control should be used during your cancer treatment. . Breast feeding warning: Women should not breast feed during treatment because this drug could enter the breast milk and badly harm a breast feeding baby.   EDUCATIONAL MATERIALS GIVEN AND REVIEWED:  Chemotherapy and you book, nutrition book, information on  cisplatin  SELF CARE ACTIVITIES WHILE ON CHEMOTHERAPY: Hydration Increase your fluid intake 48 hours prior to treatment and drink at least 8 to 12 cups (64 ounces) of water/decaff beverages per day after treatment. You can still have your cup of coffee or soda but these beverages do not count as part of your 8 to 12 cups that you need to drink daily. No alcohol intake.  Medications Continue taking your normal prescription medication as prescribed.  If you start any new herbal or new supplements please let us know first to make sure it is safe.  Mouth Care Have teeth cleaned professionally before starting treatment. Keep dentures and partial plates clean. Use soft toothbrush and do not use mouthwashes that contain alcohol. Biotene is a good mouthwash that is available at most pharmacies or may be ordered by calling [phone]. Use warm salt water gargles (1 teaspoon salt per 1 quart warm water) before and after meals and at bedtime. Or you may rinse with 2 tablespoons of three-percent hydrogen peroxide mixed in eight ounces of water. If you are still having problems with your mouth or sores in your mouth please call the clinic. If you need dental work, please let Dr. Whitney Muse know before you go for your appointment so that we can coordinate the best possible time for you in regards to your chemo regimen. You need to also let your dentist know that you are actively taking chemo. We may need to do labs prior to your dental appointment.   Skin Care Always use sunscreen that has not expired and with SPF (Sun Protection Factor) of 50 or higher. Wear hats to protect your head from the sun. Remember to use sunscreen on your hands, ears, face, & feet.  Use good moisturizing lotions such as udder cream, eucerin, or even Vaseline. Some chemotherapies can cause dry skin, color changes in your skin and nails.    . Avoid long, hot showers or baths. . Use gentle, fragrance-free soaps and laundry  detergent. . Use moisturizers, preferably creams or ointments rather than lotions because the thicker consistency is better at preventing skin dehydration. Apply the cream or ointment within 15 minutes of showering. Reapply moisturizer at night, and moisturize your hands every time after you wash them.  Hair Loss (if your doctor says your hair will fall out)  . If your doctor says that your hair is likely to fall out, decide before you begin chemo whether you want to wear a wig. You may want to shop before treatment to match your hair color. . Hats, turbans, and scarves can also camouflage hair loss, although some people prefer to  leave their heads uncovered. If you go bare-headed outdoors, be sure to use sunscreen on your scalp. . Cut your hair short. It eases the inconvenience of shedding lots of hair, but it also can reduce the emotional impact of watching your hair fall out. . Don't perm or color your hair during chemotherapy. Those chemical treatments are already damaging to hair and can enhance hair loss. Once your chemo treatments are done and your hair has grown back, it's OK to resume dyeing or perming hair. With chemotherapy, hair loss is almost always temporary. But when it grows back, it may be a different color or texture. In older adults who still had hair color before chemotherapy, the new growth may be completely gray.  Often, new hair is very fine and soft.  Infection Prevention Please wash your hands for at least 30 seconds using warm soapy water. Handwashing is the #1 way to prevent the spread of germs. Stay away from sick people or people who are getting over a cold. If you develop respiratory systems such as green/yellow mucus production or productive cough or persistent cough let us know and we will see if you need an antibiotic. It is a good idea to keep a pair of gloves on when going into grocery stores/[employer] to decrease your risk of coming into contact with germs on the carts,  etc. Carry alcohol hand gel with you at all times and use it frequently if out in public. If your temperature reaches 100.5 or higher please call the clinic and let us know.  If it is after hours or on the weekend please go to the ER if your temperature is over 100.5.  Please have your own personal thermometer at home to use.    Sex and bodily fluids If you are going to have sex, a condom must be used to protect the person that isn't taking chemotherapy. Chemo can decrease your libido (sex drive). For a few days after chemotherapy, chemotherapy can be excreted through your bodily fluids.  When using the toilet please close the lid and flush the toilet twice.  Do this for a few day after you have had chemotherapy.     Effects of chemotherapy on your sex life Some changes are simple and won't last long. They won't affect your sex life permanently. Sometimes you may feel: . too tired . not strong enough to be very active . sick or sore  . not in the mood . anxious or low Your anxiety might not seem related to sex. For example, you may be worried about the cancer and how your treatment is going. Or you may be worried about money, or about how you family are coping with your illness. These things can cause stress, which can affect your interest in sex. It's important to talk to your partner about how you feel. Remember - the changes to your sex life don't usually last long. There's usually no medical reason to stop having sex during chemo. The drugs won't have any long term physical effects on your performance or enjoyment of sex. Cancer can't be passed on to your partner during sex  Contraception It's important to use reliable contraception during treatment. Avoid getting pregnant while you or your partner are having chemotherapy. This is because the drugs may harm the baby. Sometimes chemotherapy drugs can leave a man or woman infertile.  This means you would not be able to have children in the  future. You might want to talk to  someone about permanent infertility. It can be very difficult to learn that you may no longer be able to have children. Some people find counselling helpful. There might be ways to preserve your fertility, although this is easier for men than for women. You may want to speak to a fertility expert. You can talk about sperm banking or harvesting your eggs. You can also ask about other fertility options, such as donor eggs. If you have or have had breast cancer, your doctor might advise you not to take the contraceptive pill. This is because the hormones in it might affect the cancer.  It is not known for sure whether or not chemotherapy drugs can be passed on through semen or secretions from the vagina. Because of this some doctors advise people to use a barrier method if you have sex during treatment. This applies to vaginal, anal or oral sex. Generally, doctors advise a barrier method only for the time you are actually having the treatment and for about a week after your treatment. Advice like this can be worrying, but this does not mean that you have to avoid being intimate with your partner. You can still have close contact with your partner and continue to enjoy sex.  Animals If you have cats or birds we just ask that you not change the litter or change the cage.  Please have someone else do this for you while you are on chemotherapy.   Food Safety During and After Cancer Treatment Food safety is important for people both during and after cancer treatment. Cancer and cancer treatments, such as chemotherapy, radiation therapy, and stem cell/bone marrow transplantation, often weaken the immune system. This makes it harder for your body to protect itself from foodborne illness, also called food poisoning. Foodborne illness is caused by eating food that contains harmful bacteria, parasites, or viruses.  Foods to avoid Some foods have a higher risk of becoming tainted  with bacteria. These include: Marland Kitchen Unwashed fresh fruit and vegetables, especially leafy vegetables that can hide dirt and other contaminants . Raw sprouts, such as alfalfa sprouts . Raw or undercooked beef, especially ground beef, or other raw or undercooked meat and poultry . Fatty, fried, or spicy foods immediately before or after treatment.  These can sit heavy on your stomach and make you feel nauseous. . Raw or undercooked shellfish, such as oysters. . Sushi and sashimi, which often contain raw fish.  . Unpasteurized beverages, such as unpasteurized fruit juices, raw milk, raw yogurt, or cider . Undercooked eggs, such as soft boiled, over easy, and poached; raw, unpasteurized eggs; or foods made with raw egg, such as homemade raw cookie dough and homemade mayonnaise Simple steps for food safety Shop smart. . Do not buy food stored or displayed in an unclean area. . Do not buy bruised or damaged fruits or vegetables. . Do not buy cans that have cracks, dents, or bulges. . Pick up foods that can spoil at the end of your shopping trip and store them in a cooler on the way home. Prepare and clean up foods carefully. . Rinse all fresh fruits and vegetables under running water, and dry them with a clean towel or paper towel. . Clean the top of cans before opening them. . After preparing food, wash your hands for 20 seconds with hot water and soap. Pay special attention to areas between fingers and under nails. . Clean your utensils and dishes with hot water and soap. Marland Kitchen Disinfect your kitchen and  cutting boards using 1 teaspoon of liquid, unscented bleach mixed into 1 quart of water.   Dispose of old food. . Eat canned and packaged food before its expiration date (the "use by" or "best before" date). . Consume refrigerated leftovers within 3 to 4 days. After that time, throw out the food. Even if the food does not smell or look spoiled, it still may be unsafe. Some bacteria, such as Listeria, can  grow even on foods stored in the refrigerator if they are kept for too long. Take precautions when eating out. . At restaurants, avoid buffets and salad bars where food sits out for a long time and comes in contact with many people. Food can become contaminated when someone with a virus, often a norovirus, or another "bug" handles it. . Put any leftover food in a "to-go" container yourself, rather than having the server do it. And, refrigerate leftovers as soon as you get home. . Choose restaurants that are clean and that are willing to prepare your food as you order it cooked.    MEDICATIONS: Dexamethasone 4 mg:  Take 2 tablets by mouth once a day on the day after chemotherapy and then take 2 tablets two times a day for 2 days. Take with food.  Zofran/Ondansetron 8mg  tablet. Take 1 tablet every 8 hours as needed for nausea/vomiting. (#1 nausea med to take, this can constipate)  Compazine/Prochlorperazine 10mg  tablet. Take 1 tablet every 6 hours as needed for nausea/vomiting. (#2 nausea med to take, this can make you sleepy)   EMLA cream. Apply a quarter size amount to port site 1 hour prior to chemo. Do not rub in. Cover with plastic wrap.   Over-the-Counter Meds:  Miralax 1 capful in 8 oz of fluid daily. May increase to two times a day if needed. This is a stool softener. If this doesn't work proceed you can add:  Senokot S-start with 1 tablet two times a day and increase to 4 tablets two times a day if needed. (total of 8 tablets in a 24 hour period). This is a stimulant laxative.   Call us if this does not help your bowels move.   Imodium 2mg  capsule. Take 2 capsules after the 1st loose stool and then 1 capsule every 2 hours until you go a total of 12 hours without having a loose stool. Call the [facility] if loose stools continue. If diarrhea occurs @ bedtime, take 2 capsules @ bedtime. Then take 2 capsules every 4 hours until morning. Call [facility].    Diarrhea Sheet  If  you are having loose stools/diarrhea, please purchase Imodium and begin taking as outlined:  At the first sign of poorly formed or loose stools you should begin taking Imodium(loperamide) 2 mg capsules.  Take two caplets (4mg ) followed by one caplet (2mg ) every 2 hours until you have had no diarrhea for 12 hours.  During the night take two caplets (4mg ) at bedtime and continue every 4 hours during the night until the morning.  Stop taking Imodium only after there is no sign of diarrhea for 12 hours.    Always call the [facility] if you are having loose stools/diarrhea that you can't get under control.  Loose stools/disrrhea leads to dehydration (loss of water) in your body.  We have other options of trying to get the loose stools/diarrhea to stopped but you must let us know!    Constipation Sheet *Miralax in 8 oz of fluid daily.  May increase to  two times a day if needed.  This is a stool softener.  If this not enough to keep your bowel regular:  You can add:  *Senokot S, start with one tablet twice a day and can increase to 4 tablets twice a day if needed.  This is a stimulant laxative.   Sometimes when you take pain medication you need BOTH a medicine to keep your stool soft and a medicine to help your bowel push it out!  Please call if the above does not work for you.   Do not go more than 2 days without a bowel movement.  It is very important that you do not become constipated.  It will make you feel sick to your stomach (nausea) and can cause abdominal pain and vomiting.     Nausea Sheet  Zofran/Ondansetron 8mg  tablet. Take 1 tablet every 8 hours as needed for nausea/vomiting. (#1 nausea med to take, this can constipate)  Compazine/Prochlorperazine 10mg  tablet. Take 1 tablet every 6 hours as needed for nausea/vomiting. (#2 nausea med to take, this can make you sleepy)  You can take these medications together or separately.  We would first like for you to try the Ondansetron by  itself and then take the Prochloperizine if needed. But you are allowed to take both medications at the same time if your nausea is that severe.  If you are having persistent nausea (nausea that does not stop) please take these medications on a staggered schedule so that the nausea medication stays in your body.  Please call the [facility] and let us know the amount of nausea that you are experiencing.  If you begin to vomit, you need to call the [facility] and if it is the weekend and you have vomited more than one time and cant get it to stop-go to the Emergency Room.  Persistent nausea/vomiting can lead to dehydration (loss of fluid in your body) and will make you feel terrible.   Ice chips, sips of clear liquids, foods that are @ room temperature, crackers, and toast tend to be better tolerated.    SYMPTOMS TO REPORT AS SOON AS POSSIBLE AFTER TREATMENT:  FEVER GREATER THAN 100.5 F  CHILLS WITH OR WITHOUT FEVER  NAUSEA AND VOMITING THAT IS NOT CONTROLLED WITH YOUR NAUSEA MEDICATION  UNUSUAL SHORTNESS OF BREATH  UNUSUAL BRUISING OR BLEEDING  TENDERNESS IN MOUTH AND THROAT WITH OR WITHOUT PRESENCE OF ULCERS  URINARY PROBLEMS  BOWEL PROBLEMS  UNUSUAL RASH    Wear comfortable clothing and clothing appropriate for easy access to any Portacath or PICC line. Let us know if there is anything that we can do to make your therapy better!    What to do if you need assistance after hours or on the weekends: CALL [phone].  HOLD on the line, do not hang up.  You will hear multiple messages but at the end you will be connected with a nurse triage line.  They will contact the doctor if necessary.  Most of the time they will be able to assist you.  Do not call the hospital operator.    I have been informed and understand all of the instructions given to me and have received a copy. I have been instructed to call the clinic [phone] or my family physician as soon as possible for  continued medical care, if indicated. I do not have any more questions at this time but understand that I may call the [facility] or the  Patient Navigator at [phone] during office hours should I have questions or need assistance in obtaining follow-up care.

## 2016-05-11 NOTE — Progress Notes (Signed)
cc'ed to pcp °

## 2016-05-11 NOTE — Patient Instructions (Signed)
Tiffany Hale  05/11/2016     @PREFPERIOPPHARMACY @   Your procedure is scheduled on  05/17/2016   Report to [facility] at  8  A.M.  Call this number if you have problems the morning of surgery:  [phone]   Remember:  Do not eat food or drink liquids after midnight.  Take these medicines the morning of surgery with A SIP OF WATER  Xanax, abilify, bentyl, lexapro, concerta, ditropan, oxy IR or oxycontin.   Do not wear jewelry, make-up or nail polish.  Do not wear lotions, powders, or perfumes, or deoderant.  Do not shave 48 hours prior to surgery.  Men may shave face and neck.  Do not bring valuables to the hospital.  [facility] is not responsible for any belongings or valuables.  Contacts, dentures or bridgework may not be worn into surgery.  Leave your suitcase in the car.  After surgery it may be brought to your room.  For patients admitted to the hospital, discharge time will be determined by your treatment team.  Patients discharged the day of surgery will not be allowed to drive home.   Name and phone number of your driver:   family Special instructions:  none  Please read over the following fact sheets that you were given. Anesthesia Post-op Instructions and Care and Recovery After Surgery       Implanted [facility] Guide An implanted port is a type of central line that is placed under the skin. Central lines are used to provide IV access when treatment or nutrition needs to be given through a person's veins. Implanted ports are used for long-term IV access. An implanted port may be placed because:   You need IV medicine that would be irritating to the small veins in your hands or arms.   You need long-term IV medicines, such as antibiotics.   You need IV nutrition for a long period.   You need frequent blood draws for lab tests.   You need dialysis.  Implanted ports are usually placed in the chest area, but they can also be  placed in the upper arm, the abdomen, or the leg. An implanted port has two main parts:   Reservoir. The reservoir is round and will appear as a small, raised area under your skin. The reservoir is the part where a needle is inserted to give medicines or draw blood.   Catheter. The catheter is a thin, flexible tube that extends from the reservoir. The catheter is placed into a large vein. Medicine that is inserted into the reservoir goes into the catheter and then into the vein.  HOW WILL I CARE FOR MY INCISION SITE? Do not get the incision site wet. Bathe or shower as directed by your health care provider.  HOW IS MY PORT ACCESSED? Special steps must be taken to access the port:   Before the port is accessed, a numbing cream can be placed on the skin. This helps numb the skin over the port site.   Your health care provider uses a sterile technique to access the port.  Your health care provider must put on a mask and sterile gloves.  The skin over your port is cleaned carefully with an antiseptic and allowed to dry.  The port is gently pinched between sterile gloves, and a needle is inserted into the port.  Only "non-coring" port needles should be used to access the  port. Once the port is accessed, a blood return should be checked. This helps ensure that the port is in the vein and is not clogged.   If your port needs to remain accessed for a constant infusion, a clear (transparent) bandage will be placed over the needle site. The bandage and needle will need to be changed every week, or as directed by your health care provider.   Keep the bandage covering the needle clean and dry. Do not get it wet. Follow your health care provider's instructions on how to take a shower or bath while the port is accessed.   If your port does not need to stay accessed, no bandage is needed over the port.  WHAT IS FLUSHING? Flushing helps keep the port from getting clogged. Follow your health care  provider's instructions on how and when to flush the port. Ports are usually flushed with saline solution or a medicine called heparin. The need for flushing will depend on how the port is used.   If the port is used for intermittent medicines or blood draws, the port will need to be flushed:   After medicines have been given.   After blood has been drawn.   As part of routine maintenance.   If a constant infusion is running, the port may not need to be flushed.  HOW LONG WILL MY PORT STAY IMPLANTED? The port can stay in for as long as your health care provider thinks it is needed. When it is time for the port to come out, surgery will be done to remove it. The procedure is similar to the one performed when the port was put in.  WHEN SHOULD I SEEK IMMEDIATE MEDICAL CARE? When you have an implanted port, you should seek immediate medical care if:   You notice a bad smell coming from the incision site.   You have swelling, redness, or drainage at the incision site.   You have more swelling or pain at the port site or the surrounding area.   You have a fever that is not controlled with medicine. This information is not intended to replace advice given to you by your health care provider. Make sure you discuss any questions you have with your health care provider. Document Released: 03/27/2005 Document Revised: 01/15/2013 Document Reviewed: 12/02/2012 Elsevier Interactive Patient Education  2017 [place] Insertion, Care After Refer to this sheet in the next few weeks. These instructions provide you with information on caring for yourself after your procedure. Your health care provider may also give you more specific instructions. Your treatment has been planned according to current medical practices, but problems sometimes occur. Call your health care provider if you have any problems or questions after your procedure. WHAT TO EXPECT AFTER THE PROCEDURE After  your procedure, it is typical to have the following:   Discomfort at the port insertion site. Ice packs to the area will help.  Bruising on the skin over the port. This will subside in 3-4 days. HOME CARE INSTRUCTIONS  After your port is placed, you will get a manufacturer's information card. The card has information about your port. Keep this card with you at all times.   Know what kind of port you have. There are many types of ports available.   Wear a medical alert bracelet in case of an emergency. This can help alert health care workers that you have a port.   The port can stay in for as long as  your health care provider believes it is necessary.   A home health care nurse may give medicines and take care of the port.   You or a family member can get special training and directions for giving medicine and taking care of the port at home.  SEEK MEDICAL CARE IF:   Your port does not flush or you are unable to get a blood return.   You have a fever or chills. SEEK IMMEDIATE MEDICAL CARE IF:  You have new fluid or pus coming from your incision.   You notice a bad smell coming from your incision site.   You have swelling, pain, or more redness at the incision or port site.   You have chest pain or shortness of breath. This information is not intended to replace advice given to you by your health care provider. Make sure you discuss any questions you have with your health care provider. Document Released: 01/15/2013 Document Revised: 04/01/2013 Document Reviewed: 01/15/2013 Elsevier Interactive Patient Education  2017 [employer] Home Guide Introduction A PEG tube is used to put food and fluids into the stomach. Before you leave the hospital, make sure that you know:  How to care for your PEG tube.  How to care for the opening (stoma) in your belly.  How to give yourself feedings and medicines.  When to call your doctor for help. How do I care for  my peg tube? Check your PEG tube every day. Make sure:  It is not too tight.  It is in the right place. There is a mark on the tube that shows when the tube is in the right place. Adjust the tube if you need to. How do I care for my stoma? Clean your stoma every day. Follow these steps: 1. Wash your hands with soap and water. 2. Wash the stoma gently with warm, soapy water. 3. Rinse the stoma with warm water. 4. Pat the stoma area dry. Check your stoma for:  Redness.  Leaking.  Skin irritation. How do I give a feeding? Your doctor will tell you:  How much nutrition and fluid you will need for each feeding.  How often to have a feeding.  Whether you should take medicine in the tube by itself or with a feeding. To give yourself a feeding, follow these steps. 1. Lay out all of the things that you will need. 2. Make sure that the nutritional formula is at room temperature. 3. Wash your hands with soap and water. 4. Sit up or stand up straight. You will need to stay straight while you give yourself a feeding. 5. Make sure the syringe plunger is pushed in. Place the tip of the syringe in clear water, and slowly pull the plunger to bring (draw up) the water into the syringe. 6. Remove the clamp and the cap from the PEG tube. 7. Push the water out of the syringe to clean (flush) the tube. 8. If the tube is clear, draw up the formula into the syringe. Make sure to use the right amount for each feeding. Add water if you need to. 9. Slowly push the formula from the syringe through the tube. 10. After the feeding, flush the tube with water. 11. Put the clamp and the cap on the tube. 12. Stay sitting up or standing up straight for at least 30 minutes. How do I give medicine? To give yourself medicine, follow these steps: 1. Lay out all of the things that  you will need. 2. If your medicine is in tablet form, crush it and dissolve it in water. 3. Wash your hands with soap and  water. 4. Sit up or stand up straight. You will need to stay straight while you give yourself medicine. 5. Make sure the syringe plunger is pushed in. Place the tip of the syringe in clear water, and slowly pull the plunger to bring (draw up) the water into the syringe. 6. Remove the clamp and the cap from the PEG tube. 7. Push the water out of the syringe to clean (flush) the tube. 8. If the tube is clear, draw up the medicine into the syringe. 9. Slowly push the medicine from the syringe through the tube. 10. Flush the tube with water. 11. Put the clamp and the cap on the tube. 12. Stay sitting up or standing up straight for at least 30 minutes. You should not take sustained release (SR) medicines through your tube. If you are not sure if your medicine is a SR medicine, ask your doctor or pharmacist. Get help if:  The area around your stoma is sore, irritated, or red.  You have belly pain or bloating while you are feeding or afterward.  You have a feeling of being sick to your stomach (nausea) that does not go away.  You cannot poop (constipated) or you have watery poop (diarrhea) for a long time.  You have a fever.  You have problems with your PEG tube. Get help right away if:  Your tube is blocked.  Your tube falls out.  You have pain around your tube.  You are bleeding from your tube.  Your tube is leaking.  You choke or you have trouble breathing while you are feeding or afterward. This information is not intended to replace advice given to you by your health care provider. Make sure you discuss any questions you have with your health care provider. Document Released: 04/29/2010 Document Revised: 09/02/2015 Document Reviewed: 04/01/2014  2017 Elsevier  Care of a Feeding Tube Feeding tubes are often given to those who have trouble swallowing or cannot take food or medicine. A feeding tube can:  Go into the nose and down to the stomach.  Go through the skin in the  belly (abdomen) and into the stomach or small bowel. Supplies needed to care for the tube site:  Clean gloves.  Clean wash cloth, gauze pads, or soft paper towel.  Cotton swabs.  Skin barrier ointment or cream.  Soap and water.  Precut foam pads or gauze (that go around the tube).  Tube tape. Tube site care 5. Have all supplies ready. 6. Wash hands well. 7. Put on clean gloves. 8. Remove dirty foam pads or gauze near the tube site, if present. 9. Check the skin around the tube site for redness, rash, puffiness (swelling), leaking fluid, or extra tissue growth. Call your doctor if you see any of these. 10. Wet the gauze and cotton swabs with water and soap. 11. Wipe the area closest to the tube with cotton swabs. Wipe the surrounding skin with moistened gauze. Rinse with water. 12. Dry the skin and tube site with a dry gauze pad or soft paper towel. Do not use antibiotic ointments at the tube site. 13. If the skin is red, apply petroleum jelly in a circular motion, using a cotton swab. Your doctor may suggest a different cream or ointment. Use what the doctor suggests. 14. Apply a new pre-cut foam pad or gauze  around the tube. Tape the edges down. Foam pads or gauze may be left off if there is no fluid at the tube site. 15. Use tape or a device that will attach your feeding tube to your skin or do as directed. Rotate where you tape the tube. 16. Sit the person up. 28. Throw away used supplies. 18. Remove gloves. 19. Wash hands. Supplies needed to flush a feeding tube:  Clean gloves.  60 mL syringe (that connects to feeding tube).  Towel.  Water. Flushing a feeding tube 1. Have all supplies ready. 2. Wash hands well. 3. Put on clean gloves. 4. Pull 30 mL of water into the syringe. 5. Bend (kink) the feeding tube while disconnecting it from the feeding-bag tubing or while removing the plug at the end of the tube. 6. Insert the tip of the syringe into the end of the feeding  tube. Stop bending the tube. Slowly inject the water. 7. If you cannot inject the water, the person with the feeding tube should lay on their left side.  Do not use a syringe smaller than 60 mL to flush the tubing.  Do not inject the water with force. 8. After injecting the water, remove the syringe. 9. Always flush the tube before giving the first medicine, between medicines, and after the final medicine before starting a feeding.  Do not mix medicines with liquid food (formula) before giving medicines.  Do not mix medicines with other medicines before giving medicines.  Completely flush medicines through the tube so they do not mix with the liquid food. 10. Throw away used supplies. 11. Remove gloves. 12. Wash hands. This information is not intended to replace advice given to you by your health care provider. Make sure you discuss any questions you have with your health care provider. Document Released: 12/20/2011 Document Revised: 09/02/2015 Document Reviewed: 11/09/2011 Elsevier Interactive Patient Education  2017 [place].  Esophagogastroduodenoscopy Introduction Esophagogastroduodenoscopy (EGD) is a procedure to examine the lining of the esophagus, stomach, and first part of the small intestine (duodenum). This procedure is done to check for problems such as inflammation, bleeding, ulcers, or growths. During this procedure, a long, flexible, lighted tube with a camera attached (endoscope) is inserted down the throat. Tell a health care provider about:  Any allergies you have.  All medicines you are taking, including vitamins, herbs, eye drops, creams, and over-the-counter medicines.  Any problems you or family members have had with anesthetic medicines.  Any blood disorders you have.  Any surgeries you have had.  Any medical conditions you have.  Whether you are pregnant or may be pregnant. What are the risks? Generally, this is a safe procedure. However, problems  may occur, including:  Infection.  Bleeding.  A tear (perforation) in the esophagus, stomach, or duodenum.  Trouble breathing.  Excessive sweating.  Spasms of the larynx.  A slowed heartbeat.  Low blood pressure. What happens before the procedure?  Follow instructions from your health care provider about eating or drinking restrictions.  Ask your health care provider about:  Changing or stopping your regular medicines. This is especially important if you are taking diabetes medicines or blood thinners.  Taking medicines such as aspirin and ibuprofen. These medicines can thin your blood. Do not take these medicines before your procedure if your health care provider instructs you not to.  Plan to have someone take you home after the procedure.  If you wear dentures, be ready to remove them before the procedure. What  happens during the procedure?  To reduce your risk of infection, your health care team will wash or sanitize their hands.  An IV tube will be put in a vein in your hand or arm. You will get medicines and fluids through this tube.  You will be given one or more of the following:  A medicine to help you relax (sedative).  A medicine to numb the area (local anesthetic). This medicine may be sprayed into your throat. It will make you feel more comfortable and keep you from gagging or coughing during the procedure.  A medicine for pain.  A mouth guard may be placed in your mouth to protect your teeth and to keep you from biting on the endoscope.  You will be asked to lie on your left side.  The endoscope will be lowered down your throat into your esophagus, stomach, and duodenum.  Air will be put into the endoscope. This will help your health care provider see better.  The lining of your esophagus, stomach, and duodenum will be examined.  Your health care provider may:  Take a tissue sample so it can be looked at in a lab (biopsy).  Remove  growths.  Remove objects (foreign bodies) that are stuck.  Treat any bleeding with medicines or other devices that stop tissue from bleeding.  Widen (dilate) or stretch narrowed areas of your esophagus and stomach.  The endoscope will be taken out. The procedure may vary among health care providers and hospitals. What happens after the procedure?  Your blood pressure, heart rate, breathing rate, and blood oxygen level will be monitored often until the medicines you were given have worn off.  Do not eat or drink anything until the numbing medicine has worn off and your gag reflex has returned. This information is not intended to replace advice given to you by your health care provider. Make sure you discuss any questions you have with your health care provider. Document Released: 07/28/2004 Document Revised: 09/02/2015 Document Reviewed: 02/18/2015  2017 Elsevier Esophagogastroduodenoscopy, Care After Introduction Refer to this sheet in the next few weeks. These instructions provide you with information about caring for yourself after your procedure. Your health care provider may also give you more specific instructions. Your treatment has been planned according to current medical practices, but problems sometimes occur. Call your health care provider if you have any problems or questions after your procedure. What can I expect after the procedure? After the procedure, it is common to have:  A sore throat.  Nausea.  Bloating.  Dizziness.  Fatigue. Follow these instructions at home:  Do not eat or drink anything until the numbing medicine (local anesthetic) has worn off and your gag reflex has returned. You will know that the local anesthetic has worn off when you can swallow comfortably.  Do not drive for 24 hours if you received a medicine to help you relax (sedative).  If your health care provider took a tissue sample for testing during the procedure, make sure to get your  test results. This is your responsibility. Ask your health care provider or the department performing the test when your results will be ready.  Keep all follow-up visits as told by your health care provider. This is important. Contact a health care provider if:  You cannot stop coughing.  You are not urinating.  You are urinating less than usual. Get help right away if:  You have trouble swallowing.  You cannot eat or drink.  You  have throat or chest pain that gets worse.  You are dizzy or light-headed.  You faint.  You have nausea or vomiting.  You have chills.  You have a fever.  You have severe abdominal pain.  You have black, tarry, or bloody stools. This information is not intended to replace advice given to you by your health care provider. Make sure you discuss any questions you have with your health care provider. Document Released: 03/13/2012 Document Revised: 09/02/2015 Document Reviewed: 02/18/2015  2017 Elsevier  Monitored Anesthesia Care Anesthesia is a term that refers to techniques, procedures, and medicines that help a person stay safe and comfortable during a medical procedure. Monitored anesthesia care, or sedation, is one type of anesthesia. Your anesthesia specialist may recommend sedation if you will be having a procedure that does not require you to be unconscious, such as:  Cataract surgery.  A dental procedure.  A biopsy.  A colonoscopy. During the procedure, you may receive a medicine to help you relax (sedative). There are three levels of sedation:  Mild sedation. At this level, you may feel awake and relaxed. You will be able to follow directions.  Moderate sedation. At this level, you will be sleepy. You may not remember the procedure.  Deep sedation. At this level, you will be asleep. You will not remember the procedure. The more medicine you are given, the deeper your level of sedation will be. Depending on how you respond to the  procedure, the anesthesia specialist may change your level of sedation or the type of anesthesia to fit your needs. An anesthesia specialist will monitor you closely during the procedure. Let your health care provider know about:  Any allergies you have.  All medicines you are taking, including vitamins, herbs, eye drops, creams, and over-the-counter medicines.  Any use of steroids (by mouth or as a cream).  Any problems you or family members have had with sedatives and anesthetic medicines.  Any blood disorders you have.  Any surgeries you have had.  Any medical conditions you have, such as sleep apnea.  Whether you are pregnant or may be pregnant.  Any use of cigarettes, alcohol, or street drugs. What are the risks? Generally, this is a safe procedure. However, problems may occur, including:  Getting too much medicine (oversedation).  Nausea.  Allergic reaction to medicines.  Trouble breathing. If this happens, a breathing tube may be used to help with breathing. It will be removed when you are awake and breathing on your own.  Heart trouble.  Lung trouble. Before the procedure Staying hydrated  Follow instructions from your health care provider about hydration, which may include:  Up to 2 hours before the procedure - you may continue to drink clear liquids, such as water, clear fruit juice, black coffee, and plain tea. Eating and drinking restrictions  Follow instructions from your health care provider about eating and drinking, which may include:  8 hours before the procedure - stop eating heavy meals or foods such as meat, fried foods, or fatty foods.  6 hours before the procedure - stop eating light meals or foods, such as toast or cereal.  6 hours before the procedure - stop drinking milk or drinks that contain milk.  2 hours before the procedure - stop drinking clear liquids. Medicines  Ask your health care provider about:  Changing or stopping your regular  medicines. This is especially important if you are taking diabetes medicines or blood thinners.  Taking medicines such as aspirin  and ibuprofen. These medicines can thin your blood. Do not take these medicines before your procedure if your health care provider instructs you not to. Tests and exams  You will have a physical exam.  You may have blood tests done to show:  How well your kidneys and liver are working.  How well your blood can clot.  General instructions  Plan to have someone take you home from the hospital or clinic.  If you will be going home right after the procedure, plan to have someone with you for 24 hours. What happens during the procedure?  Your blood pressure, heart rate, breathing, level of pain and overall condition will be monitored.  An IV tube will be inserted into one of your veins.  Your anesthesia specialist will give you medicines as needed to keep you comfortable during the procedure. This may mean changing the level of sedation.  The procedure will be performed. After the procedure  Your blood pressure, heart rate, breathing rate, and blood oxygen level will be monitored until the medicines you were given have worn off.  Do not drive for 24 hours if you received a sedative.  You may:  Feel sleepy, clumsy, or nauseous.  Feel forgetful about what happened after the procedure.  Have a sore throat if you had a breathing tube during the procedure.  Vomit. This information is not intended to replace advice given to you by your health care provider. Make sure you discuss any questions you have with your health care provider. Document Released: 12/21/2004 Document Revised: 09/03/2015 Document Reviewed: 07/18/2015 Elsevier Interactive Patient Education  2017 [employer], Care After These instructions provide you with information about caring for yourself after your procedure. Your health care provider may also give you  more specific instructions. Your treatment has been planned according to current medical practices, but problems sometimes occur. Call your health care provider if you have any problems or questions after your procedure. What can I expect after the procedure? After your procedure, it is common to:  Feel sleepy for several hours.  Feel clumsy and have poor balance for several hours.  Feel forgetful about what happened after the procedure.  Have poor judgment for several hours.  Feel nauseous or vomit.  Have a sore throat if you had a breathing tube during the procedure. Follow these instructions at home: For at least 24 hours after the procedure:   Do not:  Participate in activities in which you could fall or become injured.  Drive.  Use heavy machinery.  Drink alcohol.  Take sleeping pills or medicines that cause drowsiness.  Make important decisions or sign legal documents.  Take care of children on your own.  Rest. Eating and drinking  Follow the diet that is recommended by your health care provider.  If you vomit, drink water, juice, or soup when you can drink without vomiting.  Make sure you have little or no nausea before eating solid foods. General instructions  Have a responsible adult stay with you until you are awake and alert.  Take over-the-counter and prescription medicines only as told by your health care provider.  If you smoke, do not smoke without supervision.  Keep all follow-up visits as told by your health care provider. This is important. Contact a health care provider if:  You keep feeling nauseous or you keep vomiting.  You feel light-headed.  You develop a rash.  You have a fever. Get help right away if:  You have trouble breathing. This information is not intended to replace advice given to you by your health care provider. Make sure you discuss any questions you have with your health care provider. Document Released: 07/18/2015  Document Revised: 11/17/2015 Document Reviewed: 07/18/2015 Elsevier Interactive Patient Education  2017 [employer].

## 2016-05-11 NOTE — Patient Instructions (Addendum)
1. Have your labs drawn when you are able to. 2. We will help schedule your ultrasound for you. Call us when you think you may be having to go to [facility] and we will see if we can get you worked then at that time. 3. If you are becoming tired of boost, you can try other options such as a different flavor of boost, mixing boost with ice cream to make a cold milkshake to help your throat pain, or Carnation Instant Breakfast. 4. I am giving you samples of Amitiza 8 g. Take a pill twice a day, after boost/on a full stomach. 5. Call us in 1-2 weeks and let us know if it is helping. 6. Return for follow-up in 6 months.

## 2016-05-11 NOTE — Telephone Encounter (Signed)
Spoke with son [name] about the [facility] clinic that we are starting here.  It would be on the same day as the one in [place] and then they would still start treatment on 05/24/2016.  They decided they wanted to come to the clinic here in [place] because it was closer to home.

## 2016-05-12 ENCOUNTER — Encounter: Payer: Self-pay | Admitting: Dietician

## 2016-05-12 ENCOUNTER — Telehealth (HOSPITAL_COMMUNITY): Payer: Self-pay | Admitting: Nurse Practitioner

## 2016-05-12 MED ORDER — OSMOLITE 1.2 CAL PO LIQD: 474.0000 mL | Freq: Three times a day (TID) | ORAL | Status: DC

## 2016-05-12 NOTE — H&P (Signed)
  NTS SOAP Note  Vital Signs:  Vitals as of: [id number]: Systolic Q000111Q: Diastolic 71: Heart Rate 91: Temp 100.46F (Temporal): Height 39ft 4in: Weight 108Lbs 0 Ounces: Pain Level 7: BMI 18.54   BMI : 18.54 kg/m2  Subjective: This 61 year old female presents for of  hypopharyngeal carcinoma.  This was recently diagnosed.  Patient is for by oncology for both a Port-A-Cath placement as well as a PEG.  Patient has become malnourished due to poor intake and difficulty in swallowing.  She is about to undergo chemotherapy as well as neck irradiation.  Review of Symptoms:  Constitutional:weight loss, fatigue, weakness Head:negative Eyes:blurred vision bilateral   Sinus problems Cardiovascular:negative Respiratory:dyspnea, cough Gastrointestinnegative Genitourinary:negative  neck pain Skin:negative Hematolgic/Lymphatic:negative Allergic/Immunologic:negative   Past Medical History:Reviewed  Past Medical History  Surgical History:  colonoscopy, EGD, foot surgery, laryngoscopy, total abdominal hysterectomy Medical Problems:  hypopharyngeal carcinoma, anxiety,  history of cirrhosis, history of hepatitis C infection which has been treated, asthma, arthritis, depression Allergies:  nonsteroidal anti-inflammatory drugs, codeine, aspirin Medications:  methylphenidate, aripipazole,  trazodone,  escitalopram, zolpidem, oxycodone, oxybutynin, omeprazole   Social History:Reviewed  Social History  Preferred Language: English Race:  White Ethnicity: Not Hispanic / Latino Age: 61 year Marital Status:  D Alcohol:  unknown   Smoking Status: Former smoker reviewed on 05/11/2016 Started Date: 05/11/2014 Stopped Date:  Functional Status reviewed on 05/11/2016 ------------------------------------------------ Bathing: Normal Cooking: Normal Dressing: Normal Driving: Normal Eating: Normal Managing Meds: Normal Oral Care: Normal Shopping: Normal Toileting: Normal Transferring:  Normal Walking: Normal Cognitive Status reviewed on 05/11/2016 ------------------------------------------------ Attention: Normal Decision Making: Normal Language: Normal Memory: Normal Motor: Normal Perception: Normal Problem Solving: Normal Visual and Spatial: Normal   Family History:Reviewed  Family Health History Family History is Unknown    Objective Information:  cachectic white female in no acute distress. Skin:no rash or prominent lesions Head:Atraumatic; no masses; no abnormalities.   Temporal wasting noted.   Small mouth, no masses noted. Neck:Supple with   Swollen mass emanating in the left upper neck.  Heart:RRR, no murmur or gallop.  Normal S1, S2.  No S3, S4.  Lungs:CTA bilaterally, no wheezes, rhonchi, rales.  Breathing unlabored. Abdomen:Soft, NT/ND, normal bowel sounds, no HSM, no masses.  No peritoneal signs.   Oncology notes reviewed. Assessment:  Squamous cell carcinoma of hypopharynx, malnutrition  Diagnoses: 148.9  C13.9 Malignant tumor of hypopharynx (Malignant neoplasm of hypopharynx, unspecified)  Procedures: [id number] - OFFICE OUTPATIENT NEW 30 MINUTES    Plan:   scheduled for PEG/Port-A-Cath insertion on 05/17/16.   Patient Education:Alternative treatments to surgery were discussed with patient (and family).Risks and benefits  of procedure   Including bleeding, infection, pneumothorax, and bowel injury were fully explained to the patient (and family) who gave informed consent. Patient/family questions were addressed.  Follow-up:Pending Surgery

## 2016-05-12 NOTE — Progress Notes (Signed)
Tube feeding note for new H/N cancer pt ; has imminent chemoradiation planned   PEG/CATH insertion planned 2/7  Wt Readings from Last 10 Encounters:  05/11/16 105 lb 12.8 oz (48 kg)  05/10/16 108 lb 11 oz (49.3 kg)  05/09/16 106 lb (48.1 kg)  05/08/16 107 lb 3.2 oz (48.6 kg)  05/02/16 106 lb 8 oz (48.3 kg)  03/01/16 107 lb (48.5 kg)  11/16/15 119 lb (54 kg)  11/12/15 119 lb (54 kg)  11/09/15 115 lb 3.2 oz (52.3 kg)  07/29/15 126 lb (57.2 kg)   Patient weight appears to be slowly decreasing. Her PO intake has worsened due to tumour burden. Most intake has been nutritional Supplements such as Boost/ensure  Had discussed impending tube feeding reliance with patient and son at last visit.    Estimated Needs/TF recs Most recent Anthropometrics: Ht: 5\' 4"   Wt: 105 lbs 12 oz (48 kg) BMI: 18.2 -underweight  Estimated nutritional requirements for pt while undergoing concomitant chemoradiation:  Kcals: 1700-1900 kcals (35-40 kcal/kg bw) Protein: 72-86 g Pro (1.5-1.8 g/kg bw) Fluid: >1.7 L fluid  Starting Tube Feed Regimen  6 Cans Osmolite 1.2, split into 3 bolus feeds, 2 cans each, through PEG. Flush with 90 cc before and after each feed. Total feed volume 654 ml or ~22 oz  Starting Tube feed Regimen Provides: 1706 kcals 79 g Pro 1170 cc fluid + 540 cc from flushes   Had asked pt to start with only 2-3 cans per day to see how she tolerates the tube feeding and to make sure she does not quickly become overwhelmed. She is to continue with PO intake as long as safely possible and she may not require the full regimen initially. She has yet to see ST.   RD will follow up regarding her tf regimen via phone next week  Will obtain signed orders from provider and follow up advanced home care  Burtis Junes RD, LDN, [facility] Pager: [phone] 05/12/2016 12:29 PM

## 2016-05-12 NOTE — Patient Instructions (Signed)
PA number for [place] [id number]

## 2016-05-12 NOTE — Telephone Encounter (Signed)
05/12/16 son cx next week's appt because patient will be getting a stomach port on Wed.

## 2016-05-12 NOTE — Progress Notes (Signed)
Tube feed order encounter

## 2016-05-15 ENCOUNTER — Telehealth (HOSPITAL_COMMUNITY): Payer: Self-pay | Admitting: Emergency Medicine

## 2016-05-15 ENCOUNTER — Encounter (HOSPITAL_COMMUNITY)
Admission: RE | Admit: 2016-05-15 | Discharge: 2016-05-15 | Disposition: A | Discharge status: Home / Self Care | Payer: Medicaid Other | Source: Ambulatory Visit | Attending: General Surgery | Admitting: General Surgery

## 2016-05-15 ENCOUNTER — Ambulatory Visit (INDEPENDENT_AMBULATORY_CARE_PROVIDER_SITE_OTHER): Payer: Medicaid Other | Admitting: Otolaryngology

## 2016-05-15 DIAGNOSIS — Z01812 Encounter for preprocedural laboratory examination: Secondary | ICD-10-CM | POA: Diagnosis not present

## 2016-05-15 LAB — SURGICAL PCR SCREEN
MRSA, PCR: POSITIVE — AB
Staphylococcus aureus: POSITIVE — AB

## 2016-05-15 MED ORDER — MUPIROCIN 2 % EX OINT
TOPICAL_OINTMENT | CUTANEOUS | Status: AC
  Filled 2016-05-15: qty 22

## 2016-05-15 NOTE — Telephone Encounter (Signed)
Pt called to verify appts today.  She has a pre-op appt at 1:15 pm in short stay.  Then Dr Benjamine Mola appt at 2:40 today.  Pt said she was very confused with her appts.

## 2016-05-15 NOTE — Pre-Procedure Instructions (Signed)
Patient is scheduled for Port placement and EGD with feeding tube insertion on 05/17/2016.  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  Due to airway concerns, case discussed with Dr Patsey Berthold.  Chart flagged and he will assess airway am of surgery, as he is not in building for assessment at time of PAT.

## 2016-05-15 NOTE — Addendum Note (Signed)
Encounter addended by: Eppie Gibson, MD on: 05/15/2016 11:15 AM<BR>    Actions taken: Sign clinical note

## 2016-05-16 ENCOUNTER — Encounter: Payer: Self-pay | Admitting: Dietician

## 2016-05-16 ENCOUNTER — Encounter: Payer: Self-pay | Admitting: *Deleted

## 2016-05-16 DIAGNOSIS — Z5111 Encounter for antineoplastic chemotherapy: Secondary | ICD-10-CM | POA: Diagnosis not present

## 2016-05-16 LAB — COMPREHENSIVE METABOLIC PANEL
ALK PHOS: 56 U/L (ref 33–130)
ALT: 17 U/L (ref 6–29)
AST: 20 U/L (ref 10–35)
Albumin: 3.3 g/dL — ABNORMAL LOW (ref 3.6–5.1)
BUN: 10 mg/dL (ref 7–25)
CALCIUM: 9.2 mg/dL (ref 8.6–10.4)
CO2: 33 mmol/L — ABNORMAL HIGH (ref 20–31)
Chloride: 99 mmol/L (ref 98–110)
Creat: 0.57 mg/dL (ref 0.50–0.99)
Glucose, Bld: 101 mg/dL — ABNORMAL HIGH (ref 65–99)
POTASSIUM: 4.4 mmol/L (ref 3.5–5.3)
Sodium: 138 mmol/L (ref 135–146)
Total Bilirubin: 0.5 mg/dL (ref 0.2–1.2)
Total Protein: 7 g/dL (ref 6.1–8.1)

## 2016-05-16 LAB — CBC WITH DIFFERENTIAL/PLATELET
BASOS PCT: 0 %
Basophils Absolute: 0 cells/uL (ref 0–200)
EOS ABS: 43 {cells}/uL (ref 15–500)
Eosinophils Relative: 1 %
HEMATOCRIT: 28.8 % — AB (ref 35.0–45.0)
HEMOGLOBIN: 8.9 g/dL — AB (ref 11.7–15.5)
LYMPHS ABS: 602 {cells}/uL — AB (ref 850–3900)
LYMPHS PCT: 14 %
MCH: 25 pg — ABNORMAL LOW (ref 27.0–33.0)
MCHC: 30.9 g/dL — ABNORMAL LOW (ref 32.0–36.0)
MCV: 80.9 fL (ref 80.0–100.0)
MONO ABS: 602 {cells}/uL (ref 200–950)
MPV: 10.6 fL (ref 7.5–12.5)
Monocytes Relative: 14 %
NEUTROS ABS: 3053 {cells}/uL (ref 1500–7800)
Neutrophils Relative %: 71 %
Platelets: 139 10*3/uL — ABNORMAL LOW (ref 140–400)
RBC: 3.56 MIL/uL — ABNORMAL LOW (ref 3.80–5.10)
RDW: 15.3 % — ABNORMAL HIGH (ref 11.0–15.0)
WBC: 4.3 10*3/uL (ref 3.8–10.8)

## 2016-05-16 LAB — PROTIME-INR
INR: 1.1
PROTHROMBIN TIME: 11.8 s — AB (ref 9.0–11.5)

## 2016-05-16 LAB — AFP TUMOR MARKER: AFP TUMOR MARKER: 1.9 ng/mL (ref <6.1)

## 2016-05-16 MED ORDER — OSMOLITE 1.2 CAL PO LIQD: 474.0000 mL | Freq: Three times a day (TID) | ORAL | Status: AC

## 2016-05-16 NOTE — Pre-Procedure Instructions (Signed)
Dr Patsey Berthold aware of labs and potential airway issues. Will assess on arrival 05/17/2016. No further orders given.

## 2016-05-16 NOTE — Progress Notes (Signed)
[place] Psychosocial Distress Screening Clinical Social Work  Clinical Social Work was referred by distress screening protocol and radiation oncology.  The patient scored a 7 on the Psychosocial Distress Thermometer which indicates severe distress. Clinical Social Worker reviewed chart and phoned pt at home to assess for distress and other psychosocial needs. CSW introduced self, explain role of CSW at [facility]. Pt not up to talking and requested CSW call her son this afternoon. CSW has an off site meeting later today and made pt aware of this conflict. CSW explained CSW will be present at her [facility] appointment at [facility] next week. Pt open to meeting with CSW at that appointment and reviewing concerns in person. CSW to follow and assist accordingly.   ONCBCN DISTRESS SCREENING 05/02/2016  Screening Type Initial Screening  Distress experienced in past week (1-10) 7  Practical problem type Insurance;Transportation;Food  Emotional problem type Depression;Nervousness/Anxiety;Adjusting to illness  Spiritual/Religous concerns type Relating to God  Information Concerns Type Lack of info about diagnosis;Lack of info about treatment;Lack of info about complementary therapy choices;Lack of info about maintaining fitness  Physical Problem type Pain;Mouth sores/swallowing;Loss of appetitie;Talking;Skin dry/itchy  Physician notified of physical symptoms Yes  Referral to clinical psychology No  Referral to clinical social work Yes  Referral to dietition Yes  Referral to financial advocate No  Referral to support programs No  Referral to palliative care No    Clinical Social Worker follow up needed: Yes.    If yes, follow up plan:  See above Loren Racer, [place] Tuesdays   [phone]

## 2016-05-17 ENCOUNTER — Ambulatory Visit (HOSPITAL_COMMUNITY): Payer: Medicaid Other | Admitting: Anesthesiology

## 2016-05-17 ENCOUNTER — Ambulatory Visit (HOSPITAL_COMMUNITY)
Admission: RE | Admit: 2016-05-17 | Discharge: 2016-05-17 | Disposition: A | Discharge status: Home / Self Care | Payer: Medicaid Other | Source: Ambulatory Visit | Attending: General Surgery | Admitting: General Surgery

## 2016-05-17 ENCOUNTER — Ambulatory Visit (HOSPITAL_COMMUNITY): Payer: Medicaid Other

## 2016-05-17 ENCOUNTER — Encounter (HOSPITAL_COMMUNITY): Payer: Self-pay | Admitting: *Deleted

## 2016-05-17 ENCOUNTER — Encounter (HOSPITAL_COMMUNITY)
Admission: RE | Disposition: A | Discharge status: Home / Self Care | Payer: Self-pay | Source: Ambulatory Visit | Attending: General Surgery

## 2016-05-17 DIAGNOSIS — Z79899 Other long term (current) drug therapy: Secondary | ICD-10-CM | POA: Insufficient documentation

## 2016-05-17 DIAGNOSIS — C139 Malignant neoplasm of hypopharynx, unspecified: Secondary | ICD-10-CM | POA: Insufficient documentation

## 2016-05-17 DIAGNOSIS — T8182XA Emphysema (subcutaneous) resulting from a procedure, initial encounter: Secondary | ICD-10-CM | POA: Diagnosis not present

## 2016-05-17 DIAGNOSIS — Y838 Other surgical procedures as the cause of abnormal reaction of the patient, or of later complication, without mention of misadventure at the time of the procedure: Secondary | ICD-10-CM | POA: Diagnosis not present

## 2016-05-17 DIAGNOSIS — J939 Pneumothorax, unspecified: Secondary | ICD-10-CM

## 2016-05-17 DIAGNOSIS — K7460 Unspecified cirrhosis of liver: Secondary | ICD-10-CM | POA: Diagnosis not present

## 2016-05-17 DIAGNOSIS — E46 Unspecified protein-calorie malnutrition: Secondary | ICD-10-CM | POA: Diagnosis not present

## 2016-05-17 DIAGNOSIS — Z8619 Personal history of other infectious and parasitic diseases: Secondary | ICD-10-CM | POA: Diagnosis not present

## 2016-05-17 DIAGNOSIS — F329 Major depressive disorder, single episode, unspecified: Secondary | ICD-10-CM | POA: Diagnosis not present

## 2016-05-17 DIAGNOSIS — J95811 Postprocedural pneumothorax: Secondary | ICD-10-CM | POA: Insufficient documentation

## 2016-05-17 DIAGNOSIS — F419 Anxiety disorder, unspecified: Secondary | ICD-10-CM | POA: Insufficient documentation

## 2016-05-17 DIAGNOSIS — Z87891 Personal history of nicotine dependence: Secondary | ICD-10-CM | POA: Diagnosis not present

## 2016-05-17 DIAGNOSIS — Z95828 Presence of other vascular implants and grafts: Secondary | ICD-10-CM

## 2016-05-17 HISTORY — PX: PEG PLACEMENT: SHX5437

## 2016-05-17 HISTORY — PX: PORTACATH PLACEMENT: SHX2246

## 2016-05-17 HISTORY — PX: ESOPHAGOGASTRODUODENOSCOPY (EGD) WITH PROPOFOL: SHX5813

## 2016-05-17 SURGERY — INSERTION, TUNNELED CENTRAL VENOUS DEVICE, WITH PORT
Anesthesia: Monitor Anesthesia Care | Site: Mouth

## 2016-05-17 MED ORDER — FENTANYL CITRATE (PF) 100 MCG/2ML IJ SOLN
INTRAMUSCULAR | Status: AC
  Filled 2016-05-17: qty 2

## 2016-05-17 MED ORDER — LIDOCAINE HCL (PF) 1 % IJ SOLN
INTRAMUSCULAR | Status: DC | PRN
  Administered 2016-05-17: 10 mL
  Administered 2016-05-17: 6 mL

## 2016-05-17 MED ORDER — PROPOFOL 10 MG/ML IV BOLUS
INTRAVENOUS | Status: AC
  Filled 2016-05-17: qty 20

## 2016-05-17 MED ORDER — FENTANYL CITRATE (PF) 100 MCG/2ML IJ SOLN
25.0000 ug | Freq: Once | INTRAMUSCULAR | Status: AC
  Administered 2016-05-17: 25 ug via INTRAVENOUS

## 2016-05-17 MED ORDER — SODIUM CHLORIDE 0.9 % IV SOLN
INTRAVENOUS | Status: DC | PRN
  Administered 2016-05-17: 500 mL via INTRAMUSCULAR

## 2016-05-17 MED ORDER — GLYCOPYRROLATE 0.2 MG/ML IJ SOLN
INTRAMUSCULAR | Status: AC
Start: 2016-05-17 — End: 2016-05-17
  Filled 2016-05-17: qty 1

## 2016-05-17 MED ORDER — LIDOCAINE HCL (PF) 1 % IJ SOLN
INTRAMUSCULAR | Status: AC
  Filled 2016-05-17: qty 30

## 2016-05-17 MED ORDER — LACTATED RINGERS IV SOLN
INTRAVENOUS | Status: DC
  Administered 2016-05-17 (×2): via INTRAVENOUS

## 2016-05-17 MED ORDER — BUTAMBEN-TETRACAINE-BENZOCAINE 2-2-14 % EX AERO
INHALATION_SPRAY | CUTANEOUS | Status: DC | PRN
  Administered 2016-05-17: 2 via TOPICAL

## 2016-05-17 MED ORDER — LIDOCAINE HCL (PF) 1 % IJ SOLN
INTRAMUSCULAR | Status: AC
  Filled 2016-05-17: qty 5

## 2016-05-17 MED ORDER — CEFAZOLIN SODIUM-DEXTROSE 2-4 GM/100ML-% IV SOLN
2.0000 g | INTRAVENOUS | Status: AC
  Administered 2016-05-17: 2 g via INTRAVENOUS
  Filled 2016-05-17: qty 100

## 2016-05-17 MED ORDER — POVIDONE-IODINE 10 % EX OINT
TOPICAL_OINTMENT | CUTANEOUS | Status: AC
Start: 2016-05-17 — End: 2016-05-17
  Filled 2016-05-17: qty 1

## 2016-05-17 MED ORDER — FENTANYL CITRATE (PF) 100 MCG/2ML IJ SOLN
INTRAMUSCULAR | Status: DC | PRN
  Administered 2016-05-17: 25 ug via INTRAVENOUS

## 2016-05-17 MED ORDER — MIDAZOLAM HCL 5 MG/5ML IJ SOLN
INTRAMUSCULAR | Status: DC | PRN
  Administered 2016-05-17 (×2): 1 mg via INTRAVENOUS

## 2016-05-17 MED ORDER — MIDAZOLAM HCL 2 MG/2ML IJ SOLN
INTRAMUSCULAR | Status: AC
  Filled 2016-05-17: qty 2

## 2016-05-17 MED ORDER — MIDAZOLAM HCL 2 MG/2ML IJ SOLN
1.0000 mg | INTRAMUSCULAR | Status: AC
  Administered 2016-05-17: 2 mg via INTRAVENOUS
  Filled 2016-05-17: qty 2

## 2016-05-17 MED ORDER — SUCCINYLCHOLINE CHLORIDE 20 MG/ML IJ SOLN
INTRAMUSCULAR | Status: AC
  Filled 2016-05-17: qty 1

## 2016-05-17 MED ORDER — GLYCOPYRROLATE 0.2 MG/ML IJ SOLN
0.2000 mg | Freq: Once | INTRAMUSCULAR | Status: AC
  Administered 2016-05-17: 0.2 mg via INTRAVENOUS

## 2016-05-17 MED ORDER — PHENYLEPHRINE 40 MCG/ML (10ML) SYRINGE FOR IV PUSH (FOR BLOOD PRESSURE SUPPORT)
PREFILLED_SYRINGE | INTRAVENOUS | Status: AC
  Filled 2016-05-17: qty 10

## 2016-05-17 MED ORDER — CHLORHEXIDINE GLUCONATE CLOTH 2 % EX PADS: 6.0000 | MEDICATED_PAD | Freq: Once | CUTANEOUS | Status: DC

## 2016-05-17 MED ORDER — LIDOCAINE VISCOUS 2 % MT SOLN
OROMUCOSAL | Status: AC
  Filled 2016-05-17: qty 15

## 2016-05-17 MED ORDER — PROPOFOL 500 MG/50ML IV EMUL
INTRAVENOUS | Status: DC | PRN
  Administered 2016-05-17: 25 ug/kg/min via INTRAVENOUS

## 2016-05-17 MED ORDER — HEPARIN SOD (PORK) LOCK FLUSH 100 UNIT/ML IV SOLN
INTRAVENOUS | Status: AC
  Filled 2016-05-17: qty 5

## 2016-05-17 MED ORDER — HEPARIN SOD (PORK) LOCK FLUSH 100 UNIT/ML IV SOLN
INTRAVENOUS | Status: DC | PRN
Start: 2016-05-17 — End: 2016-05-17
  Administered 2016-05-17: 500 [IU]

## 2016-05-17 MED ORDER — ARTIFICIAL TEARS OP OINT
TOPICAL_OINTMENT | OPHTHALMIC | Status: AC
  Filled 2016-05-17: qty 7

## 2016-05-17 MED ORDER — ONDANSETRON HCL 4 MG/2ML IJ SOLN
4.0000 mg | Freq: Once | INTRAMUSCULAR | Status: AC
Start: 2016-05-17 — End: 2016-05-17
  Administered 2016-05-17: 4 mg via INTRAVENOUS
  Filled 2016-05-17: qty 2

## 2016-05-17 SURGICAL SUPPLY — 41 items
APPLIER CLIP 9.375 SM OPEN (CLIP)
BAG DECANTER FOR FLEXI CONT (MISCELLANEOUS) ×5 IMPLANT
BAG HAMPER (MISCELLANEOUS) ×5 IMPLANT
CATH HICKMAN DUAL 12.0 (CATHETERS) IMPLANT
CHLORAPREP W/TINT 10.5 ML (MISCELLANEOUS) ×10 IMPLANT
CLIP APPLIE 9.375 SM OPEN (CLIP) IMPLANT
CLOTH BEACON ORANGE TIMEOUT ST (SAFETY) ×5 IMPLANT
COVER LIGHT HANDLE STERIS (MISCELLANEOUS) ×10 IMPLANT
DECANTER SPIKE VIAL GLASS SM (MISCELLANEOUS) ×5 IMPLANT
DERMABOND ADVANCED (GAUZE/BANDAGES/DRESSINGS) ×2
DERMABOND ADVANCED .7 DNX12 (GAUZE/BANDAGES/DRESSINGS) ×3 IMPLANT
DRAPE C-ARM FOLDED MOBILE STRL (DRAPES) ×5 IMPLANT
DRAPE LAPAROTOMY TRNSV 102X78 (DRAPE) ×5 IMPLANT
DRAPE PROXIMA HALF (DRAPES) ×5 IMPLANT
ELECT REM PT RETURN 9FT ADLT (ELECTROSURGICAL) ×5
ELECTRODE REM PT RTRN 9FT ADLT (ELECTROSURGICAL) ×3 IMPLANT
GLOVE BIOGEL PI IND STRL 6.5 (GLOVE) ×3 IMPLANT
GLOVE BIOGEL PI IND STRL 7.0 (GLOVE) ×9 IMPLANT
GLOVE BIOGEL PI INDICATOR 6.5 (GLOVE) ×2
GLOVE BIOGEL PI INDICATOR 7.0 (GLOVE) ×6
GLOVE SURG SS PI 6.5 STRL IVOR (GLOVE) ×5 IMPLANT
GLOVE SURG SS PI 7.5 STRL IVOR (GLOVE) ×5 IMPLANT
GOWN STRL REUS W/TWL LRG LVL3 (GOWN DISPOSABLE) ×10 IMPLANT
IV NS 500ML (IV SOLUTION) ×2
IV NS 500ML BAXH (IV SOLUTION) ×3 IMPLANT
KIT PORT POWER 8FR ISP MRI (Port) ×5 IMPLANT
KIT ROOM TURNOVER APOR (KITS) ×5 IMPLANT
MANIFOLD NEPTUNE II (INSTRUMENTS) ×5 IMPLANT
NEEDLE HYPO 25X1 1.5 SAFETY (NEEDLE) ×5 IMPLANT
PACK MINOR (CUSTOM PROCEDURE TRAY) ×5 IMPLANT
PAD ARMBOARD 7.5X6 YLW CONV (MISCELLANEOUS) ×5 IMPLANT
SET BASIN LINEN APH (SET/KITS/TRAYS/PACK) ×5 IMPLANT
SET INTRODUCER 12FR PACEMAKER (SHEATH) IMPLANT
SHEATH COOK PEEL AWAY SET 8F (SHEATH) IMPLANT
SUT PROLENE 3 0 PS 2 (SUTURE) IMPLANT
SUT VIC AB 3-0 SH 27 (SUTURE) ×2
SUT VIC AB 3-0 SH 27X BRD (SUTURE) ×3 IMPLANT
SUT VIC AB 4-0 PS2 27 (SUTURE) ×5 IMPLANT
SYR 20CC LL (SYRINGE) ×5 IMPLANT
SYR CONTROL 10ML LL (SYRINGE) ×5 IMPLANT
TUBE ENDOVIVE SAFETY PEG KIT (TUBING) ×5 IMPLANT

## 2016-05-17 NOTE — Anesthesia Preprocedure Evaluation (Signed)
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 Dental  (+) Edentulous Upper, Edentulous Lower   Pulmonary asthma , former smoker,    breath sounds clear to auscultation       Cardiovascular negative cardio ROS   Rhythm:Regular Rate:Normal     Neuro/Psych PSYCHIATRIC DISORDERS Anxiety Depression    GI/Hepatic GERD  ,(+) Cirrhosis   Esophageal Varices    , Hepatitis -, A, C  Endo/Other    Renal/GU      Musculoskeletal  (+) Arthritis ,   Abdominal   Peds  Hematology   Anesthesia Other Findings   Reproductive/Obstetrics                             Anesthesia Physical Anesthesia Plan  ASA: IV  Anesthesia Plan: MAC   Post-op Pain Management:    Induction: Intravenous  Airway Management Planned: Simple Face Mask  Additional Equipment:   Intra-op Plan:   Post-operative Plan:   Informed Consent: I have reviewed the patients History and Physical, chart, labs and discussed the procedure including the risks, benefits and alternatives for the proposed anesthesia with the patient or authorized representative who has indicated his/her understanding and acceptance.     Plan Discussed with:   Anesthesia Plan Comments:         Anesthesia Quick Evaluation

## 2016-05-17 NOTE — OR Nursing (Signed)
Patient ID: Tiffany Hale, female   DOB: 08-31-1955, 61 y.o.   MRN: [id number]   Patient's oxygen saturation maintains at 94-96% on 4lpm of oxygen Drops to 79% on room air. Placed back on oxygen at 2 lpm and saturation holding at 93%.  Dr. Arnoldo Morale notified. He informed patient of a small pneumothorax on the left.

## 2016-05-17 NOTE — Anesthesia Procedure Notes (Signed)
Procedure Name: MAC Date/Time: 05/17/2016 7:26 AM Performed by: Andree Elk, AMY A Pre-anesthesia Checklist: Patient identified, Timeout performed, Emergency Drugs available, Suction available and Patient being monitored Oxygen Delivery Method: Simple face mask

## 2016-05-17 NOTE — Progress Notes (Signed)
Patient tolerated procedure well. Sats maintain in 90's except when sleeping sats decrease to upper 80/s. Sats rebound to 90's on arousal and coughing and deep breathing. Dr. Arnoldo Morale aware.

## 2016-05-17 NOTE — Discharge Instructions (Signed)
Heimlich Valve Home Care Introduction A Heimlich valve is a device that helps to drain extra air and fluid from the chest cavity. It connects to a chest tube that is inserted into the area surrounding your lungs (pleural space). The valve lets the extra air out of the chest cavity and stops new air from getting in through the chest tube. The other end of the Heimlich valve is sometimes connected to a drainage bag or container used to collect fluid that drains from the chest cavity. A Heimlich valve is often used to manage pneumothorax, a condition in which extra air leaks into the pleuralspace and prevents the lungs from fully expanding. This valve may also be used to help drain any leakage of air or fluid after surgery involving the lungs. If you are going home with a chest tube and Heimlich valve in place, follow the instructions below. How to care for the valve and chest tube  Keep the valve and tube secured to your skin as directed by your health care provider. This prevents kinks from developing in the tube and prevents the tube from being pulled out of place.  Make sure the valve opening is always clear so that air can escape. Do not block the opening with anything (such as tape) that would stop the flow of air. If the valve is connected to a drainage bag or container, make sure the air vent for the bag or container does not become blocked.  Keep in mind that you will hear the valve make noises as air or fluid passes through it.  Wear loose, comfortable clothing.  Remove or change bandages (dressings) around the insertion site only as directed by your health care provider.  Gently clean the skin near the insertion site with mild soap and water as directed by your health care provider. Avoid powders, creams, and strong soaps.  Do not take baths, swim, or use a hot tub until your health care provider approves. Take showers instead. When you shower, make sure the tube, valve, and dressings are  covered with a waterproof covering.  If you have a drainage bag or container, follow your health care providers instructions for keeping track of the amount of fluid that drains. You may be asked to write down the day, time, and amount of fluid each time you empty the drainage bag or container.  Empty the fluid from the drainage bag or container into the toilet as directed by your health care provider.  Limit activities as directed by your health care provider. You may need to avoid strenuous activity and heavy lifting for several weeks.  Avoid air travel until your health care provider says it is okay.  Keep all follow-up visits as directed by your health care provider. This is important. Contact a health care provider if:  You are having trouble caring for your chest tube and Heimlich valve.  Your Heimlich valve becomes disconnected from the chest tube. If this occurs, reconnect the valve and call your health care provider.  Your chest tube comes loose or gets pulled out.  You have a fever.  You have chills.  You have drainage, redness, swelling, or pain at the tube insertion site.  You have pain, pressure, or cramping in the chest.  You have continued shortness of breath.  You have other new symptoms. Get help right away if:  You have sudden, severe chest pain.  You have trouble breathing. This information is not intended to replace advice given to  you by your health care provider. Make sure you discuss any questions you have with your health care provider. Document Released: 10/22/2013 Document Revised: 09/02/2015 Document Reviewed: 05/08/2013  2017 Elsevier PEG Tube Home Guide A percutaneous endoscopic gastrostomy (PEG) tube is used to deliver food and fluids directly into the stomach. The tube has a clamp, a cap, and two anchors (bolsters). One bolster keeps the tube from coming out of the stomach. The other bolster holds the tube against the abdomen. You will be  taught how to use and adjust your PEG tube before you leave the hospital. You will also be taught how to care for the opening (stoma) in your abdomen. Make sure that you understand:  How to care for your PEG tube.  How to care for your stoma.  How to give yourself feedings and medicines.  When to call your health care provider for help. How do I care for my peg tube? Check your PEG tube every day. Make sure:  It is not too tight. The bolster should rest gently over the stoma.  It is in the right position. There is a Monnie Gudgel on the tube that shows when it is in the right position. Adjust the tube if you need to. How do I care for my stoma? Clean your stoma every day. Follow these steps: 1. Wash your hands with soap and water. 2. Check your stoma for redness, leaking, or skin irritation. 3. Wash the stoma gently with warm, soapy water. 4. Rinse the stoma with warm water. 5. Pat the stoma area dry. You may place a gauze pad over the opening between the outer bolster and your stoma. How do I give myself nutritional formula? Your health care provider will give you instructions about:  How much nutrition and fluid you will need for each feeding.  How often to have a feeding.  Whether to take medicine in the tube by itself or with a feeding. To give yourself a feeding, follow these steps: 1. Lay out all of the equipment that you will need. 2. Make sure that the nutritional formula is at room temperature. 3. Wash your hands with soap and water. 4. Position yourself so that you are upright. You will need to stay upright throughout the feeding and for at least 30 minutes after the feeding. 5. Make sure the syringe plunger is pushed in. Place the tip of the syringe in clear water, and slowly pull the plunger to bring (draw up) the water into the syringe. 6. Remove the clamp and the cap from the PEG tube. 7. Push the water out of the syringe to clean (flush) the tube. 8. If the tube is clear,  draw up the formula into the syringe. Make sure to use the right amount for each feeding and add water if necessary. 9. Slowly push the formula from the syringe through the tube. 10. After the feeding, flush the tube with water. 11. Put the clamp and the cap on the tube. How do I give myself medicine? To give yourself medicine, follow these steps: 1. Lay out all of the equipment that you will need. 2. If your medicine is in tablet form, crush the tablet and dissolve it in water. 3. Wash your hands with soap and water. 4. Position yourself so that you are upright. You will need to stay upright while you give yourself medicine and for at least 30 minutes afterward. 5. Make sure the syringe plunger is pushed in. Place the tip of  the syringe in clear water, and slowly pull the plunger to bring (draw up) the water into the syringe. 6. Remove the clamp and the cap from the PEG tube. 7. Push the water out of the syringe to clean (flush) the tube. 8. If the tube is clear, draw up the medicine into the syringe. 9. Slowly push the medicine from the syringe through the tube. 10. Flush the tube with water. 11. Put the clamp and the cap on the tube. You should not take sustained release (SR) medicines through your tube. If you are unsure if your medicine is a SR medicine, ask your health care provider or pharmacist. Contact a health care provider if:  You have soreness, redness, or irritation around your stoma.  You have abdominal pain or bloating during or after your feedings.  You have nausea, constipation, or diarrhea that will not go away.  You have a fever.  You have problems with your PEG tube. Get help right away if:  Your tube is blocked.  Your tube falls out.  You have pain around your tube.  You are bleeding from your tube.  Your tube is leaking.  You choke or you have trouble breathing during or after a feeding. This information is not intended to replace advice given to you  by your health care provider. Make sure you discuss any questions you have with your health care provider. Document Released: 08/11/2014 Document Revised: 08/30/2015 Document Reviewed: 04/01/2014 Elsevier Interactive Patient Education  2017 [place] Insertion, Care After Refer to this sheet in the next few weeks. These instructions provide you with information on caring for yourself after your procedure. Your health care provider may also give you more specific instructions. Your treatment has been planned according to current medical practices, but problems sometimes occur. Call your health care provider if you have any problems or questions after your procedure. WHAT TO EXPECT AFTER THE PROCEDURE After your procedure, it is typical to have the following:   Discomfort at the port insertion site. Ice packs to the area will help.  Bruising on the skin over the port. This will subside in 3-4 days. HOME CARE INSTRUCTIONS  After your port is placed, you will get a manufacturer's information card. The card has information about your port. Keep this card with you at all times.   Know what kind of port you have. There are many types of ports available.   Wear a medical alert bracelet in case of an emergency. This can help alert health care workers that you have a port.   The port can stay in for as long as your health care provider believes it is necessary.   A home health care nurse may give medicines and take care of the port.   You or a family member can get special training and directions for giving medicine and taking care of the port at home.  SEEK MEDICAL CARE IF:   Your port does not flush or you are unable to get a blood return.   You have a fever or chills. SEEK IMMEDIATE MEDICAL CARE IF:  You have new fluid or pus coming from your incision.   You notice a bad smell coming from your incision site.   You have swelling, pain, or more redness at the  incision or port site.   You have chest pain or shortness of breath. This information is not intended to replace advice given to you by your health care provider.  Make sure you discuss any questions you have with your health care provider. Document Released: 01/15/2013 Document Revised: 04/01/2013 Document Reviewed: 01/15/2013 Elsevier Interactive Patient Education  2017 [place] An implanted port is a type of central line that is placed under the skin. Central lines are used to provide IV access when treatment or nutrition needs to be given through a person's veins. Implanted ports are used for long-term IV access. An implanted port may be placed because:   You need IV medicine that would be irritating to the small veins in your hands or arms.   You need long-term IV medicines, such as antibiotics.   You need IV nutrition for a long period.   You need frequent blood draws for lab tests.   You need dialysis.  Implanted ports are usually placed in the chest area, but they can also be placed in the upper arm, the abdomen, or the leg. An implanted port has two main parts:   Reservoir. The reservoir is round and will appear as a small, raised area under your skin. The reservoir is the part where a needle is inserted to give medicines or draw blood.   Catheter. The catheter is a thin, flexible tube that extends from the reservoir. The catheter is placed into a large vein. Medicine that is inserted into the reservoir goes into the catheter and then into the vein.  HOW WILL I CARE FOR MY INCISION SITE? Do not get the incision site wet. Bathe or shower as directed by your health care provider.  HOW IS MY PORT ACCESSED? Special steps must be taken to access the port:   Before the port is accessed, a numbing cream can be placed on the skin. This helps numb the skin over the port site.   Your health care provider uses a sterile technique to access the  port.  Your health care provider must put on a mask and sterile gloves.  The skin over your port is cleaned carefully with an antiseptic and allowed to dry.  The port is gently pinched between sterile gloves, and a needle is inserted into the port.  Only "non-coring" port needles should be used to access the port. Once the port is accessed, a blood return should be checked. This helps ensure that the port is in the vein and is not clogged.   If your port needs to remain accessed for a constant infusion, a clear (transparent) bandage will be placed over the needle site. The bandage and needle will need to be changed every week, or as directed by your health care provider.   Keep the bandage covering the needle clean and dry. Do not get it wet. Follow your health care provider's instructions on how to take a shower or bath while the port is accessed.   If your port does not need to stay accessed, no bandage is needed over the port.  WHAT IS FLUSHING? Flushing helps keep the port from getting clogged. Follow your health care provider's instructions on how and when to flush the port. Ports are usually flushed with saline solution or a medicine called heparin. The need for flushing will depend on how the port is used.   If the port is used for intermittent medicines or blood draws, the port will need to be flushed:   After medicines have been given.   After blood has been drawn.   As part of routine maintenance.   If a constant infusion  is running, the port may not need to be flushed.  HOW LONG WILL MY PORT STAY IMPLANTED? The port can stay in for as long as your health care provider thinks it is needed. When it is time for the port to come out, surgery will be done to remove it. The procedure is similar to the one performed when the port was put in.  WHEN SHOULD I SEEK IMMEDIATE MEDICAL CARE? When you have an implanted port, you should seek immediate medical care if:   You notice  a bad smell coming from the incision site.   You have swelling, redness, or drainage at the incision site.   You have more swelling or pain at the port site or the surrounding area.   You have a fever that is not controlled with medicine. This information is not intended to replace advice given to you by your health care provider. Make sure you discuss any questions you have with your health care provider. Document Released: 03/27/2005 Document Revised: 01/15/2013 Document Reviewed: 12/02/2012 Elsevier Interactive Patient Education  2017 [employer].

## 2016-05-17 NOTE — OR Nursing (Signed)
Patient ID: Tiffany Hale, female   DOB: [birth year], 61 y.o.   MRN: [id number] Patient to PACU for chest tube insertiion.

## 2016-05-17 NOTE — Progress Notes (Signed)
Foam tape to L lower chest with Heimlich valve and tube.No drainage to dressing. Dressing C/D/I. Heimlich valve and tube secured with tape.

## 2016-05-17 NOTE — Interval H&P Note (Signed)
History and Physical Interval Note:  05/17/2016 7:19 AM  Tiffany Hale  has presented today for surgery, with the diagnosis of hypopharynx carcinoma  The various methods of treatment have been discussed with the patient and family. After consideration of risks, benefits and other options for treatment, the patient has consented to  Procedure(s): INSERTION PORT-A-CATH (N/A) PERCUTANEOUS ENDOSCOPIC GASTROSTOMY (PEG) PLACEMENT (N/A) as a surgical intervention .  The patient's history has been reviewed, patient examined, no change in status, stable for surgery.  I have reviewed the patient's chart and labs.  Questions were answered to the patient's satisfaction.     Aviva Signs A

## 2016-05-17 NOTE — Progress Notes (Signed)
Follow-up chest x-ray revealed a worsening left pneumothorax with subcutaneous emphysema. I did discuss this with the patient and son. Both fully understood that this was a possible complication of the Port-A-Cath and she needed an urgent chest tube placed. A timeout was performed in the PACU. A small bore left chest tube was placed without difficulty. A follow-up chest x-ray revealed resolution of the left pneumothorax. Patient will be discharged home with the Heimlich tube. She'll get a follow-up chest x-ray in a.m.

## 2016-05-17 NOTE — Anesthesia Postprocedure Evaluation (Signed)
Anesthesia Post Note  Patient: Tiffany Hale  Procedure(s) Performed: Procedure(s) (LRB): INSERTION PORT-A-CATH (procedure #1) (N/A) PERCUTANEOUS ENDOSCOPIC GASTROSTOMY (PEG) PLACEMENT (procedure #2) (N/A) ESOPHAGOGASTRODUODENOSCOPY (EGD) WITH PROPOFOL (procedure #2) (N/A)  Patient location during evaluation: PACU Anesthesia Type: MAC Level of consciousness: awake and alert and oriented Pain management: pain level controlled Vital Signs Assessment: post-procedure vital signs reviewed and stable Respiratory status: patient connected to nasal cannula oxygen Cardiovascular status: stable Postop Assessment: no signs of nausea or vomiting Anesthetic complications: no     Last Vitals:  Vitals:   05/17/16 0718 05/17/16 0720  BP: (!) 91/53   Pulse:    Resp: (!) 27 (!) 28  Temp:      Last Pain:  Vitals:   05/17/16 0639  TempSrc: Oral  PainSc: 5                  Keisi Eckford A

## 2016-05-17 NOTE — Transfer of Care (Signed)
Immediate Anesthesia Transfer of Care Note  Patient: Tiffany Hale  Procedure(s) Performed: Procedure(s): INSERTION PORT-A-CATH (procedure #1) (N/A) PERCUTANEOUS ENDOSCOPIC GASTROSTOMY (PEG) PLACEMENT (procedure #2) (N/A) ESOPHAGOGASTRODUODENOSCOPY (EGD) WITH PROPOFOL (procedure #2) (N/A)  Patient Location: PACU  Anesthesia Type:MAC  Level of Consciousness: awake, alert , oriented and patient cooperative  Airway & Oxygen Therapy: Patient Spontanous Breathing and Patient connected to nasal cannula oxygen  Post-op Assessment: Report given to RN and Post -op Vital signs reviewed and stable  Post vital signs: Reviewed and stable  Last Vitals:  Vitals:   05/17/16 0718 05/17/16 0720  BP: (!) 91/53   Pulse:    Resp: (!) 27 (!) 28  Temp:      Last Pain:  Vitals:   05/17/16 0639  TempSrc: Oral  PainSc: 5       Patients Stated Pain Goal: 8 (73/56/70 1410)  Complications: No apparent anesthesia complications

## 2016-05-17 NOTE — Op Note (Signed)
Patient:  Tiffany Hale  DOB:  1956-01-16  MRN:  [id number]   Preop Diagnosis:  Hypopharyngeal carcinoma, malnutrition  Postop Diagnosis:  Same  Procedure:  Port-A-Cath insertion, EGD with PEG placement  Surgeon:  Aviva Signs, M.D.  Anes:  Mac  Indications:  Patient is a 61 year old white female who is about to undergo chemotherapy and radiation therapy for hypopharyngeal carcinoma. She also was malnourished and needs a PEG placed. The risks and benefits of both procedures including bleeding, infection, and pneumothorax were fully explained to the patient, who gave informed consent.  Procedure note:  The patient was placed in supine position. After monitored anesthesia care was given, the left upper chest was prepped and draped using the usual sterile technique with ChloraPrep. Surgical site confirmation was performed. One percent Xylocaine was used for local anesthesia.  An incision was made below the left clavicle. The patient was placed in the Trendelenburg position. A subcutaneous pocket was formed. A needle was advanced into the left subclavian vein using the Seldinger technique with difficulty. Localization of the left subclavian vein was finally made and a guidewire was advanced into the right atrium under fluoroscopic guidance. An introducer and peel-away sheath were placed over the guidewire. The catheter was then inserted through the peel-away sheath and the peel-away sheath was removed. The catheter was then attached to the port and the port placed in subcutaneous pocket. Adequate positioning was confirmed by fluoroscopy. Good backflow of blood was noted on aspiration of the port. The port was flushed with heparin flush. The PowerPort was inserted. The subcutaneous layer was reapproximated using a 3-0 Vicryl interrupted suture. The skin was closed using a 4-0 Vicryl subcuticular suture. Dermabond was applied.  Next, an EGD with PEG placement was performed. Cetacaine spray was  used on the oral pharynx. The patient had a large fungating hypopharyngeal mass present. Care was taken to avoid this region. The endoscope was advanced down into the first portion of the duodenum without difficulty. The pylorus was noted to be widely patent. The abdomen was prepped with Betadine. One percent Xylocaine was used for local anesthesia. A needle was advanced into the antrum under direct visualization without difficulty. A guidewire was then advanced into the stomach and was grasped using the snare. The endoscope was then retracted with the guidewire. A 20 French gastrostomy tube was then attached to the wire and the wire pulled through the abdominal wall. A pull technique was utilized. The gastrostomy tube was noted to be at the 3 cm Toba Claudio at the skin level. I did not reinsert the endoscope as I did not want to disrupt the friable hypopharyngeal mass. A bolster was then placed. Betadine ointment and a dressing were applied to the PEG site.  All tape and needle counts were correct at the end of both procedures. The patient was transferred to PACU in stable condition. A chest x-ray performed at that time does not reveal any overt left pneumothorax. A repeat chest x-ray will be performed in day surgery.  Complications:  None  EBL:  Minimal  Specimen:  None

## 2016-05-17 NOTE — Progress Notes (Signed)
Patient prepared for left chest tube insertion. Dr. Arnoldo Morale, [facility] OR present, time out called for at 1129. Procedure, laterality and patient identified with two identifiers. Site marked prior and site prepped with CHG. 8Fr chest tube Heimlich valve inserted in in left lateral chest at the 5th ICS.Marland Kitchen Suction attached to verify functionality of valve. Expansion and contraction of valve noted on exhalation. Patient tolerated the procedure well.

## 2016-05-18 ENCOUNTER — Ambulatory Visit (INDEPENDENT_AMBULATORY_CARE_PROVIDER_SITE_OTHER): Payer: Medicaid Other | Admitting: Otolaryngology

## 2016-05-18 ENCOUNTER — Ambulatory Visit (HOSPITAL_COMMUNITY)
Admission: RE | Admit: 2016-05-18 | Discharge: 2016-05-18 | Disposition: A | Discharge status: Home / Self Care | Payer: Medicaid Other | Source: Ambulatory Visit | Attending: General Surgery | Admitting: General Surgery

## 2016-05-18 ENCOUNTER — Encounter (HOSPITAL_COMMUNITY): Payer: Self-pay | Admitting: General Surgery

## 2016-05-18 ENCOUNTER — Other Ambulatory Visit (HOSPITAL_COMMUNITY): Payer: Self-pay | Admitting: General Surgery

## 2016-05-18 ENCOUNTER — Telehealth: Payer: Self-pay

## 2016-05-18 DIAGNOSIS — J939 Pneumothorax, unspecified: Secondary | ICD-10-CM | POA: Insufficient documentation

## 2016-05-18 NOTE — Telephone Encounter (Signed)
Lets try Movantik 25 once a day only while she's on pain medications. Can leave samples for the patient to try and call in a week, if it's effective can send in an Rx

## 2016-05-18 NOTE — Telephone Encounter (Signed)
Pt's son, [name], came the office to say the Amitiza 8 mcg is not working. Pt would like to know can you send in a prescription for something stronger.  She is on a lot of pain meds and can't drink/eat a lot. OK to send the prescription to [facility] in [place].  Nathan's phone number is [phone].  Randall Hiss, please advise!

## 2016-05-18 NOTE — Telephone Encounter (Signed)
I called and informed Ivin Booty. She said one of them will come by tomorrow before noon to pick up the samples.  Movantik 25 mg #9 given. Take one daily one hour before breakfast.

## 2016-05-19 ENCOUNTER — Encounter (HOSPITAL_COMMUNITY): Payer: Self-pay | Admitting: Emergency Medicine

## 2016-05-19 ENCOUNTER — Other Ambulatory Visit: Payer: Self-pay

## 2016-05-19 ENCOUNTER — Ambulatory Visit (HOSPITAL_COMMUNITY): Payer: Medicaid Other | Admitting: Occupational Therapy

## 2016-05-19 ENCOUNTER — Inpatient Hospital Stay (HOSPITAL_COMMUNITY)
Admission: EM | Admit: 2016-05-19 | Discharge: 2016-05-25 | DRG: 199 | Disposition: A | Discharge status: Home / Self Care | Payer: Medicaid Other | Attending: Family Medicine | Admitting: Family Medicine

## 2016-05-19 ENCOUNTER — Emergency Department (HOSPITAL_COMMUNITY): Payer: Medicaid Other

## 2016-05-19 ENCOUNTER — Observation Stay (HOSPITAL_COMMUNITY): Payer: Medicaid Other

## 2016-05-19 DIAGNOSIS — D696 Thrombocytopenia, unspecified: Secondary | ICD-10-CM

## 2016-05-19 DIAGNOSIS — Z888 Allergy status to other drugs, medicaments and biological substances status: Secondary | ICD-10-CM

## 2016-05-19 DIAGNOSIS — R4182 Altered mental status, unspecified: Secondary | ICD-10-CM

## 2016-05-19 DIAGNOSIS — M19012 Primary osteoarthritis, left shoulder: Secondary | ICD-10-CM | POA: Diagnosis present

## 2016-05-19 DIAGNOSIS — Z809 Family history of malignant neoplasm, unspecified: Secondary | ICD-10-CM

## 2016-05-19 DIAGNOSIS — Z90710 Acquired absence of both cervix and uterus: Secondary | ICD-10-CM

## 2016-05-19 DIAGNOSIS — M549 Dorsalgia, unspecified: Secondary | ICD-10-CM | POA: Diagnosis present

## 2016-05-19 DIAGNOSIS — K219 Gastro-esophageal reflux disease without esophagitis: Secondary | ICD-10-CM | POA: Diagnosis present

## 2016-05-19 DIAGNOSIS — Z681 Body mass index (BMI) 19 or less, adult: Secondary | ICD-10-CM

## 2016-05-19 DIAGNOSIS — Z931 Gastrostomy status: Secondary | ICD-10-CM

## 2016-05-19 DIAGNOSIS — E43 Unspecified severe protein-calorie malnutrition: Secondary | ICD-10-CM | POA: Insufficient documentation

## 2016-05-19 DIAGNOSIS — Z79899 Other long term (current) drug therapy: Secondary | ICD-10-CM

## 2016-05-19 DIAGNOSIS — C321 Malignant neoplasm of supraglottis: Secondary | ICD-10-CM | POA: Diagnosis not present

## 2016-05-19 DIAGNOSIS — C139 Malignant neoplasm of hypopharynx, unspecified: Secondary | ICD-10-CM | POA: Diagnosis present

## 2016-05-19 DIAGNOSIS — Z886 Allergy status to analgesic agent status: Secondary | ICD-10-CM

## 2016-05-19 DIAGNOSIS — Z9889 Other specified postprocedural states: Secondary | ICD-10-CM

## 2016-05-19 DIAGNOSIS — D638 Anemia in other chronic diseases classified elsewhere: Secondary | ICD-10-CM

## 2016-05-19 DIAGNOSIS — G894 Chronic pain syndrome: Secondary | ICD-10-CM | POA: Diagnosis present

## 2016-05-19 DIAGNOSIS — G9340 Encephalopathy, unspecified: Secondary | ICD-10-CM | POA: Diagnosis present

## 2016-05-19 DIAGNOSIS — Z87891 Personal history of nicotine dependence: Secondary | ICD-10-CM

## 2016-05-19 DIAGNOSIS — J939 Pneumothorax, unspecified: Secondary | ICD-10-CM | POA: Diagnosis present

## 2016-05-19 DIAGNOSIS — J9601 Acute respiratory failure with hypoxia: Secondary | ICD-10-CM

## 2016-05-19 DIAGNOSIS — R64 Cachexia: Secondary | ICD-10-CM | POA: Diagnosis present

## 2016-05-19 DIAGNOSIS — J95811 Postprocedural pneumothorax: Principal | ICD-10-CM | POA: Diagnosis present

## 2016-05-19 LAB — COMPREHENSIVE METABOLIC PANEL
ALT: 17 U/L (ref 14–54)
ANION GAP: 5 (ref 5–15)
AST: 23 U/L (ref 15–41)
Albumin: 2.7 g/dL — ABNORMAL LOW (ref 3.5–5.0)
Alkaline Phosphatase: 43 U/L (ref 38–126)
BUN: 12 mg/dL (ref 6–20)
CO2: 34 mmol/L — AB (ref 22–32)
Calcium: 8.5 mg/dL — ABNORMAL LOW (ref 8.9–10.3)
Chloride: 94 mmol/L — ABNORMAL LOW (ref 101–111)
Creatinine, Ser: 0.56 mg/dL (ref 0.44–1.00)
GFR calc non Af Amer: >60 mL/min (ref >60)
Glucose, Bld: 99 mg/dL (ref 65–99)
POTASSIUM: 4.1 mmol/L (ref 3.5–5.1)
SODIUM: 133 mmol/L — AB (ref 135–145)
Total Bilirubin: 0.4 mg/dL (ref 0.3–1.2)
Total Protein: 6.7 g/dL (ref 6.5–8.1)

## 2016-05-19 LAB — CBC WITH DIFFERENTIAL/PLATELET
Basophils Absolute: 0 10*3/uL (ref 0.0–0.1)
Basophils Relative: 0 %
EOS ABS: 0 10*3/uL (ref 0.0–0.7)
EOS PCT: 0 %
HCT: 26.6 % — ABNORMAL LOW (ref 36.0–46.0)
Hemoglobin: 8.2 g/dL — ABNORMAL LOW (ref 12.0–15.0)
LYMPHS ABS: 0.8 10*3/uL (ref 0.7–4.0)
Lymphocytes Relative: 12 %
MCH: 25.4 pg — AB (ref 26.0–34.0)
MCHC: 30.8 g/dL (ref 30.0–36.0)
MCV: 82.4 fL (ref 78.0–100.0)
MONO ABS: 0.8 10*3/uL (ref 0.1–1.0)
MONOS PCT: 12 %
Neutro Abs: 4.9 10*3/uL (ref 1.7–7.7)
Neutrophils Relative %: 76 %
PLATELETS: 154 10*3/uL (ref 150–400)
RBC: 3.23 MIL/uL — AB (ref 3.87–5.11)
RDW: 14.7 % (ref 11.5–15.5)
WBC: 6.5 10*3/uL (ref 4.0–10.5)

## 2016-05-19 MED ORDER — METHYLPHENIDATE HCL ER (OSM) 36 MG PO TBCR: 36.0000 mg | EXTENDED_RELEASE_TABLET | Freq: Every day | ORAL | Status: DC

## 2016-05-19 MED ORDER — SODIUM CHLORIDE 0.9 % IV SOLN: Freq: Once | INTRAVENOUS | Status: DC

## 2016-05-19 MED ORDER — ESCITALOPRAM OXALATE 10 MG PO TABS
30.0000 mg | ORAL_TABLET | Freq: Every day | ORAL | Status: DC
  Administered 2016-05-20 – 2016-05-25 (×6): 30 mg via ORAL
  Filled 2016-05-19 (×6): qty 3

## 2016-05-19 MED ORDER — ENOXAPARIN SODIUM 40 MG/0.4ML ~~LOC~~ SOLN
40.0000 mg | SUBCUTANEOUS | Status: DC
  Administered 2016-05-20 – 2016-05-24 (×5): 40 mg via SUBCUTANEOUS
  Filled 2016-05-19 (×5): qty 0.4

## 2016-05-19 MED ORDER — OXYCODONE HCL ER 15 MG PO T12A
15.0000 mg | EXTENDED_RELEASE_TABLET | Freq: Two times a day (BID) | ORAL | Status: DC
  Administered 2016-05-20 – 2016-05-21 (×4): 15 mg via ORAL
  Filled 2016-05-19 (×4): qty 1

## 2016-05-19 MED ORDER — ACETAMINOPHEN 325 MG PO TABS: 650.0000 mg | ORAL_TABLET | Freq: Four times a day (QID) | ORAL | Status: DC | PRN

## 2016-05-19 MED ORDER — ALPRAZOLAM 0.5 MG PO TABS
0.5000 mg | ORAL_TABLET | Freq: Three times a day (TID) | ORAL | Status: DC | PRN
  Administered 2016-05-20 – 2016-05-25 (×10): 0.5 mg via ORAL
  Filled 2016-05-19 (×11): qty 1

## 2016-05-19 MED ORDER — SODIUM CHLORIDE 0.9 % IV SOLN
INTRAVENOUS | Status: DC
  Administered 2016-05-20 – 2016-05-24 (×3): via INTRAVENOUS

## 2016-05-19 MED ORDER — ACETAMINOPHEN 650 MG RE SUPP: 650.0000 mg | Freq: Four times a day (QID) | RECTAL | Status: DC | PRN

## 2016-05-19 MED ORDER — ZOLPIDEM TARTRATE 5 MG PO TABS
5.0000 mg | ORAL_TABLET | Freq: Every day | ORAL | Status: DC
  Administered 2016-05-20 – 2016-05-24 (×6): 5 mg via ORAL
  Filled 2016-05-19 (×6): qty 1

## 2016-05-19 MED ORDER — SODIUM CHLORIDE 0.9 % IV SOLN
Freq: Once | INTRAVENOUS | Status: AC
  Administered 2016-05-20: via INTRAVENOUS

## 2016-05-19 MED ORDER — OXYBUTYNIN CHLORIDE 5 MG PO TABS
5.0000 mg | ORAL_TABLET | Freq: Three times a day (TID) | ORAL | Status: DC
  Administered 2016-05-20 – 2016-05-25 (×18): 5 mg via ORAL
  Filled 2016-05-19 (×19): qty 1

## 2016-05-19 MED ORDER — ARIPIPRAZOLE 10 MG PO TABS
15.0000 mg | ORAL_TABLET | Freq: Every day | ORAL | Status: DC
  Administered 2016-05-20 – 2016-05-21 (×2): 15 mg via ORAL
  Filled 2016-05-19 (×5): qty 2

## 2016-05-19 MED ORDER — OXYCODONE HCL 5 MG PO TABS
5.0000 mg | ORAL_TABLET | ORAL | Status: DC | PRN
  Administered 2016-05-20 – 2016-05-25 (×17): 5 mg via ORAL
  Filled 2016-05-19 (×18): qty 1

## 2016-05-19 NOTE — ED Triage Notes (Signed)
Cancer pt who had line and feeding tube placement was on Weds, put in by Dr Arnoldo Morale- all right last night - awakened today incoherent per her son with shaking and difficulty ambulating

## 2016-05-19 NOTE — ED Provider Notes (Addendum)
[facility] DEPT Provider Note   CSN: [id number] Arrival date & time: 05/19/16  1837  By signing my name below, I, Neta Mends, attest that this documentation has been prepared under the direction and in the presence of Fredia Sorrow, MD . Electronically Signed: Neta Mends, ED Scribe. 05/19/2016. 9:23 PM.    History   Chief Complaint Chief Complaint  Patient presents with  . Altered Mental Status   LEVEL 5 CAVEAT DUE TO ACUITY OF CONDITION  The history is provided by a relative. No language interpreter was used.   HPI Comments:  Tiffany Hale is a 61 y.o. female who presents to the Emergency Department, here due to altered mental status since this morning. Per pt's son, pt woke up this morning and was incoherent, confused, shaking, and had trouble ambulating. Son states that she slept from 0800 to 1700, and then he fed her through her feeding tube. Son reports associated abdominal pain after feeding today. Son also repots a fluctuating temperature between 98 and 101. Per son, pt was baseline last night. She is a throat cancer pt who had a port and a feeding tube placed 2 days ago by Dr. Arnoldo Morale. Son reports that pt had a pneumothorax this week and had surgery 2 days ago, and was told yesterday by her doctor that there was no improvement. Son states that pt does not use O2 at home, and when her oxygen level gets low she gets more confused. Pt quit smoking 2 years ago. No alleviating factors noted. Pt has no other complaints at this time.   Past Medical History:  Diagnosis Date  . Anxiety   . Arthritis    OA left shoulder  . Asthma   . BMI (body mass index) 20.0-29.9 2010 155 LBS  . Chronic back pain   . Cirrhosis ([place]) NOV 2011   MRI LIVER-5 SML DYSPLASTIC NODULES; 2009: TBILI 0.7 ALK PHOS 128 AST 56 ALT 53 ALB 3.8 INR 1.3  . 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 GERD (gastroesophageal reflux disease)   . HBV (hepatitis B virus) infection  2009: HBsAb POS  . HCV (hepatitis C virus) 2009   COMPLETED RX 2015-OCT 2015 VL UNDETECTABLE  . Hepatitis A 2009 Ab POS  . 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 Tobacco abuse     Patient Active Problem List   Diagnosis Date Noted  . Pneumothorax on left 05/19/2016  . Loss of weight 05/11/2016  . 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 Dysphagia 11/09/2015  . IBS (irritable bowel syndrome) 05/12/2015  . Constipation 10/07/2014  . Gastritis 10/07/2014  . Bilateral foot pain 09/09/2014  . Colon adenomas 04/01/2014  . Postmenopausal atrophic vaginitis 07/18/2012  . Dyspareunia 07/18/2012  . 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 BIOPSY N/A 07/21/2014   Procedure: BIOPSY;  Surgeon: Danie Binder, MD;  Location: [facility];  Service: Endoscopy;  Laterality: N/A;  Gastric  . COLONOSCOPY  OCT 2011    6 MM SIMPLE ADENOMA, BENIGN COLONIC MUCOSA  . ESOPHAGOGASTRODUODENOSCOPY  MAR 2011   Grade 1 varices, GASTRITIS  . ESOPHAGOGASTRODUODENOSCOPY (EGD) WITH PROPOFOL N/A 07/09/2012   SLF:The mucosa of the esophagus appeared normal/Small hiatal hernia/MODERATE Non-erosive gastritis (inflammation) with hemorrhage was found in the gastric antrum; multiple biopsies (reactive gastropathy). No H.pylori. No varices.Next  EGD 07/2014.  Marland Kitchen ESOPHAGOGASTRODUODENOSCOPY (EGD) WITH PROPOFOL N/A 07/21/2014   SLF: 1. No esophageal , gastric, or duodanl varices seen. 2. Mild non-erosive gastritis.  Marland Kitchen ESOPHAGOGASTRODUODENOSCOPY (EGD) WITH PROPOFOL N/A 11/16/2015   Procedure: ESOPHAGOGASTRODUODENOSCOPY (EGD) WITH PROPOFOL;  Surgeon: Danie Binder, MD;  [facility];  Service: Endoscopy;  Laterality: N/A;  815  . ESOPHAGOGASTRODUODENOSCOPY (EGD) WITH PROPOFOL N/A 05/17/2016   Procedure: ESOPHAGOGASTRODUODENOSCOPY (EGD) WITH  PROPOFOL (procedure #2);  Surgeon: Aviva Signs, MD;  Location: [facility];  Service: General;  Laterality: N/A;  . Extremity Surgery     and Pelvic, after fall  . FOOT FRACTURE SURGERY    . FOOT SURGERY Right 2009   [facility]-crushed foot with fall and it was repaired  . LARYNGOSCOPY Left 05/08/2016   Procedure: DIRECT LARYNGOSCOPY WITH LARYNGEAL MASS BIOPSY;  Surgeon: Leta Baptist, MD;  Location: [facility];  Service: ENT;  Laterality: Left;  . PEG PLACEMENT N/A 05/17/2016   Procedure: PERCUTANEOUS ENDOSCOPIC GASTROSTOMY (PEG) PLACEMENT (procedure #2);  Surgeon: Aviva Signs, MD;  Location: [facility];  Service: General;  Laterality: N/A;  . PORTACATH PLACEMENT N/A 05/17/2016   Procedure: INSERTION PORT-A-CATH (procedure #1);  Surgeon: Aviva Signs, MD;  Location: [facility];  Service: General;  Laterality: N/A;  . SAVORY DILATION N/A 11/16/2015   Procedure: SAVORY DILATION;  Surgeon: Danie Binder, MD;  [facility];  Service: Endoscopy;  Laterality: N/A;  . SHOULDER ACROMIOPLASTY Left 03/01/2016   Procedure: SHOULDER ACROMIOPLASTY;  Surgeon: Earlie Server, MD;  Location: [facility];  Service: Orthopedics;  Laterality: Left;  . SHOULDER ARTHROSCOPY WITH OPEN ROTATOR CUFF REPAIR AND DISTAL CLAVICLE ACROMINECTOMY Left 03/01/2016   Procedure: SHOULDER ARTHROSCOPY WITH SUBACROMIAL DECOMPRESSION and debridement with acromioplasty,OPEN ROTATOR CUFF REPAIR AND DISTAL CLAVICULECTOMY;  Surgeon: Earlie Server, MD;  Location: [facility];  Service: Orthopedics;  Laterality: Left;  Pre/Post Op scalene block  . TOTAL ABDOMINAL HYSTERECTOMY  1995 DUB    OB History    No data available       Home Medications    Prior to Admission medications   Medication Sig Start Date End Date Taking? Authorizing Provider  ALPRAZolam Duanne Moron) 0.5 MG tablet Take 1 tablet (0.5 mg total) by mouth 3 (three) times daily as needed for anxiety or sleep. 05/10/16  Yes Twana First, MD    ARIPiprazole (ABILIFY) 15 MG tablet Take 15 mg by mouth daily.     Yes Historical Provider, MD  dicyclomine (BENTYL) 10 MG capsule Take 1 capsule (10 mg total) by mouth 4 (four) times daily -  before meals and at bedtime. As needed Patient taking differently: Take 10 mg by mouth 4 (four) times daily as needed for spasms. As needed 11/24/15  Yes Carlis Stable, NP  escitalopram (LEXAPRO) 20 MG tablet Take 30 mg by mouth daily. 1.5 tablet   Yes Historical Provider, MD  methylphenidate (CONCERTA) 36 MG CR tablet Take 36 mg by mouth daily.    Yes Historical Provider, MD  oxybutynin (DITROPAN) 5 MG tablet Take 5 mg by mouth 3 (three) times daily.   Yes Historical Provider, MD  oxyCODONE (OXY IR/ROXICODONE) 5 MG immediate release tablet Take 1 tablet (5 mg total) by mouth every 4 (four) hours as needed for severe pain. 05/02/16  Yes Patrici Ranks, MD  oxyCODONE (OXYCONTIN) 15 mg 12 hr tablet Take 15 mg by mouth every 12 (twelve) hours. For pain levels between 5 or 6   Yes Historical  Provider, MD  oxyCODONE (OXYCONTIN) 40 mg 12 hr tablet Take 1 tablet (40 mg total) by mouth every 12 (twelve) hours. 05/10/16  Yes Twana First, MD  traZODone (DESYREL) 150 MG tablet Take 150 mg by mouth at bedtime.    Yes Historical Provider, MD  zolpidem (AMBIEN) 10 MG tablet Take 10 mg by mouth at bedtime.   Yes Historical Provider, MD  lubiprostone (AMITIZA) 8 MCG capsule Take 1 capsule (8 mcg total) by mouth 2 (two) times daily with a meal. 05/11/16   Carlis Stable, NP    Family History Family History  Problem Relation Age of Onset  . Birth defects Brother   . Cancer Maternal Grandmother   . Cancer Maternal Grandfather   . Cirrhosis Father   . Colon cancer Neg Hx   . Colon polyps Neg Hx     Social History Social History  Substance Use Topics  . Smoking status: Former Smoker    Packs/day: 0.50    Years: 40.00    Types: Cigarettes    Quit date: 05/25/2014  . Smokeless tobacco: Never Used     Comment: 3 days to go  through a pack   . Alcohol use No     Comment: history of ETOH abuse, no alcohol X 10 years      Allergies   Aspirin; Codeine; and Nsaids   Review of Systems Review of Systems  Unable to perform ROS: Acuity of condition     Physical Exam Updated Vital Signs BP 110/59 (BP Location: Left Arm)   Pulse 79   Temp 98.9 F (37.2 C) (Oral)   Resp 15   Ht 5' 4"  (1.626 m)   Wt 45.4 kg   SpO2 97%   BMI 17.16 kg/m   Physical Exam  Constitutional: She appears well-developed and well-nourished. No distress.  HENT:  Head: Normocephalic and atraumatic.  Mouth/Throat: Oropharynx is clear and moist. No oropharyngeal exudate.  Moist mucous membranes.   Eyes: Conjunctivae and EOM are normal. Pupils are equal, round, and reactive to light.  Sclera clear, eyes slow to follow but tracking normally  Neck: Neck supple.  Cardiovascular: Normal rate, regular rhythm and normal heart sounds.   No murmur heard. Pulmonary/Chest: Effort normal and breath sounds normal. No respiratory distress.  Decreased breath sounds sounds on left side, normal on right side. Port catheter on right side. G tube on LUQ area.   Abdominal: Soft. She exhibits no distension. There is no tenderness.  Musculoskeletal: She exhibits no edema.  Neurological: She is alert.  Skin: Skin is warm and dry.  Psychiatric: She has a normal mood and affect.  Nursing note and vitals reviewed.    ED Treatments / Results  DIAGNOSTIC STUDIES:  Oxygen Saturation is 100% on 5L NCO2, normal by my interpretation.    COORDINATION OF CARE:  9:23 PM Discussed treatment plan with pt at bedside and pt agreed to plan.   Labs (all labs ordered are listed, but only abnormal results are displayed) Labs Reviewed  CBC WITH DIFFERENTIAL/PLATELET - Abnormal; Notable for the following:       Result Value   RBC 3.23 (*)    Hemoglobin 8.2 (*)    HCT 26.6 (*)    MCH 25.4 (*)    All other components within normal limits  COMPREHENSIVE  METABOLIC PANEL - Abnormal; Notable for the following:    Sodium 133 (*)    Chloride 94 (*)    CO2 34 (*)    Calcium  8.5 (*)    Albumin 2.7 (*)    All other components within normal limits    EKG  EKG Interpretation None       Radiology Dg Chest 2 View  Result Date: 05/19/2016 CLINICAL DATA:  Decreased breath sounds EXAM: CHEST  2 VIEW COMPARISON:  05/18/2016 FINDINGS: Cardiac shadow is within normal limits. Left chest wall port is again seen and stable. Small bore left chest tube is noted. Recurrent pneumothorax is seen particularly inferiorly and laterally on the left. Small apical component is noted as well. The right lying remains well aerated. Subcutaneous emphysema is seen. No bony abnormality is noted. IMPRESSION: Recurrent left pneumothorax. Left chest wall port and small bore chest tube on the left are seen and stable. Critical Value/emergent results were called by telephone at the time of interpretation on 05/19/2016 at 9:07 pm to Dr. Fredia Sorrow , who verbally acknowledged these results. Electronically Signed   By: Inez Catalina M.D.   On: 05/19/2016 21:08   Dg Chest 2 View  Result Date: 05/18/2016 CLINICAL DATA:  LEFT pneumothorax, followup EXAM: CHEST  2 VIEW COMPARISON:  05/17/2016 FINDINGS: LEFT subclavian Port-A-Cath with tip projecting over SVC. Small bore LEFT thoracostomy tube unchanged. Normal heart size, mediastinal contours, and pulmonary vascularity. Atherosclerotic calcification aorta. Small residual LEFT apical pneumothorax not significantly changed from previous exam. Chronic bronchitic and interstitial prominence unchanged. Remaining lungs clear. No pleural effusion or acute osseous findings. Again identified soft tissue emphysema at the cervical region and projecting over the LEFT axilla. IMPRESSION: Persistent small LEFT apical pneumothorax not significantly changed. Findings discussed with Dr. Arnoldo Morale on 05/18/2016 at 0940 hours. Electronically Signed   By: Lavonia Dana M.D.   On: 05/18/2016 09:41    Procedures Procedures (including critical care time)  Medications Ordered in ED Medications - No data to display   Initial Impression / Assessment and Plan / ED Course  I have reviewed the triage vital signs and the nursing notes.  Pertinent labs & imaging results that were available during my care of the patient were reviewed by me and considered in my medical decision making (see chart for details).     Patient with increased confusion and hypoxia predominantly today. Chest x-ray shows worsening left pneumothorax. Patient still has chest tube in place. Discussed with Dr. Arnoldo Morale from general surgery. Patient hooked back up to Pleur-evac suctioning. Discussed with the hospitalist she will be admitted. Patient seems to be doing better on her percent nonrebreather with oxygen sats up in the night upper 90s. Patient's labs without any significant abnormalities. Temporary middle orders completed for observation MedSurg admission. Patient's chest x-ray will be repeated parsley 1 hour after being hooked back up to suctioning.  Repeat chest x-ray shows improvement in the pneumothorax. Patient clinically also more alert. Patient nontoxic. Arm percent nonrebreather options sats remained in the upper 90s. Patient the seems to be improving.  Final Clinical Impressions(s) / ED Diagnoses   Final diagnoses:  Altered mental status, unspecified altered mental status type  Pneumothorax on left    New Prescriptions New Prescriptions   No medications on file  I personally performed the services described in this documentation, which was scribed in my presence. The recorded information has been reviewed and is accurate.       Fredia Sorrow, MD 05/19/16 2227    Fredia Sorrow, MD 05/19/16 [phone]

## 2016-05-19 NOTE — H&P (Signed)
TRH H&P    Patient Demographics:    Tiffany Hale, is a 61 y.o. female  MRN: [id number]  DOB - 1955/04/23  Admit Date - 05/19/2016  Referring MD/NP/PA: Dr. Bobby Rumpf  Outpatient Primary MD for the patient is [name], NP  Patient coming from: Home  Chief Complaint  Patient presents with  . Altered Mental Status      HPI:    Tiffany Hale  is a 61 y.o. female, With recent diagnosis of hypopharyngeal squamous cell carcinoma, who had Port-A-Cath placement and PEG placement on 10/01/7626 with complication of pneumothorax. Chest tube was placed by surgery and patient was discharged home. Patient had chest x-ray from yesterday which showed persistent small left apical pneumothorax. This morning patient was confused, incoherent had trouble and bloating. She slept from 8 AM to 5 PM. She had fluctuant in temperature between 98 and 101. She denies chest pain at this time. No shortness of breath. No cough. No nausea vomiting or diarrhea. Repeat chest x-ray after she was pulled back to Pleur-evac suctioning, showed improvement in pneumothorax. Dr. Arnoldo Morale was consulted by the ED physician and he will see the patient in a.m.    Review of systems:    In addition to the HPI above,  No Fever-chills, No Headache, No changes with Vision or hearing, No problems swallowing food or Liquids, No Blood in stool or Urine, No dysuria, No new skin rashes or bruises, No new joints pains-aches,  No new weakness, tingling, numbness in any extremity, No recent weight gain or loss, No polyuria, polydypsia or polyphagia, No significant Mental Stressors.  A full 10 point Review of Systems was done, except as stated above, all other Review of Systems were negative.   With Past History of the following :    Past Medical History:  Diagnosis Date  . Anxiety   . Arthritis    OA left shoulder  . Asthma   . BMI  (body mass index) 20.0-29.9 2010 155 LBS  . Chronic back pain   . Cirrhosis ([place]) NOV 2011   MRI LIVER-5 SML DYSPLASTIC NODULES; 2009: TBILI 0.7 ALK PHOS 128 AST 56 ALT 53 ALB 3.8 INR 1.3  . 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 GERD (gastroesophageal reflux disease)   . HBV (hepatitis B virus) infection 2009: HBsAb POS  . HCV (hepatitis C virus) 2009   COMPLETED RX 2015-OCT 2015 VL UNDETECTABLE  . Hepatitis A 2009 Ab POS  . 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 Tobacco abuse       Past Surgical History:  Procedure Laterality Date  . BIOPSY N/A 07/21/2014   Procedure: BIOPSY;  Surgeon: Danie Binder, MD;  Location: [facility];  Service: Endoscopy;  Laterality: N/A;  Gastric  . COLONOSCOPY  OCT 2011    6 MM SIMPLE ADENOMA, BENIGN COLONIC MUCOSA  . ESOPHAGOGASTRODUODENOSCOPY  MAR 2011   Grade 1 varices, GASTRITIS  . ESOPHAGOGASTRODUODENOSCOPY (EGD) WITH PROPOFOL N/A 07/09/2012   SLF:The mucosa of the esophagus appeared normal/Small hiatal hernia/MODERATE  Non-erosive gastritis (inflammation) with hemorrhage was found in the gastric antrum; multiple biopsies (reactive gastropathy). No H.pylori. No varices.Next EGD 07/2014.  Marland Kitchen ESOPHAGOGASTRODUODENOSCOPY (EGD) WITH PROPOFOL N/A 07/21/2014   SLF: 1. No esophageal , gastric, or duodanl varices seen. 2. Mild non-erosive gastritis.  Marland Kitchen ESOPHAGOGASTRODUODENOSCOPY (EGD) WITH PROPOFOL N/A 11/16/2015   Procedure: ESOPHAGOGASTRODUODENOSCOPY (EGD) WITH PROPOFOL;  Surgeon: Danie Binder, MD;  [facility];  Service: Endoscopy;  Laterality: N/A;  815  . ESOPHAGOGASTRODUODENOSCOPY (EGD) WITH PROPOFOL N/A 05/17/2016   Procedure: ESOPHAGOGASTRODUODENOSCOPY (EGD) WITH PROPOFOL (procedure #2);  Surgeon: Aviva Signs, MD;  Location: [facility];  Service: General;  Laterality: N/A;  . Extremity Surgery     and Pelvic, after fall  . FOOT FRACTURE SURGERY    . FOOT SURGERY Right 2009   [facility]-crushed foot with fall and it was  repaired  . LARYNGOSCOPY Left 05/08/2016   Procedure: DIRECT LARYNGOSCOPY WITH LARYNGEAL MASS BIOPSY;  Surgeon: Leta Baptist, MD;  Location: [place];  Service: ENT;  Laterality: Left;  . PEG PLACEMENT N/A 05/17/2016   Procedure: PERCUTANEOUS ENDOSCOPIC GASTROSTOMY (PEG) PLACEMENT (procedure #2);  Surgeon: Aviva Signs, MD;  Location: [facility];  Service: General;  Laterality: N/A;  . PORTACATH PLACEMENT N/A 05/17/2016   Procedure: INSERTION PORT-A-CATH (procedure #1);  Surgeon: Aviva Signs, MD;  Location: [facility];  Service: General;  Laterality: N/A;  . SAVORY DILATION N/A 11/16/2015   Procedure: SAVORY DILATION;  Surgeon: Danie Binder, MD;  [facility];  Service: Endoscopy;  Laterality: N/A;  . SHOULDER ACROMIOPLASTY Left 03/01/2016   Procedure: SHOULDER ACROMIOPLASTY;  Surgeon: Earlie Server, MD;  Location: [place];  Service: Orthopedics;  Laterality: Left;  . SHOULDER ARTHROSCOPY WITH OPEN ROTATOR CUFF REPAIR AND DISTAL CLAVICLE ACROMINECTOMY Left 03/01/2016   Procedure: SHOULDER ARTHROSCOPY WITH SUBACROMIAL DECOMPRESSION and debridement with acromioplasty,OPEN ROTATOR CUFF REPAIR AND DISTAL CLAVICULECTOMY;  Surgeon: Earlie Server, MD;  Location: [place];  Service: Orthopedics;  Laterality: Left;  Pre/Post Op scalene block  . TOTAL ABDOMINAL HYSTERECTOMY  1995 DUB      Social History:      Social History  Substance Use Topics  . Smoking status: Former Smoker    Packs/day: 0.50    Years: 40.00    Types: Cigarettes    Quit date: 05/25/2014  . Smokeless tobacco: Never Used     Comment: 3 days to go through a pack   . Alcohol use No     Comment: history of ETOH abuse, no alcohol X 10 years        Family History :     Family History  Problem Relation Age of Onset  . Birth defects Brother   . Cancer Maternal Grandmother   . Cancer Maternal Grandfather   . Cirrhosis Father   . Colon cancer Neg Hx   . Colon polyps Neg Hx         Home Medications:   Prior to Admission medications   Medication Sig Start Date End Date Taking? Authorizing Provider  ALPRAZolam Duanne Moron) 0.5 MG tablet Take 1 tablet (0.5 mg total) by mouth 3 (three) times daily as needed for anxiety or sleep. 05/10/16  Yes Twana First, MD  ARIPiprazole (ABILIFY) 15 MG tablet Take 15 mg by mouth daily.     Yes Historical Provider, MD  dicyclomine (BENTYL) 10 MG capsule Take 1 capsule (10 mg total) by mouth 4 (four) times daily -  before meals and at bedtime. As  needed Patient taking differently: Take 10 mg by mouth 4 (four) times daily as needed for spasms. As needed 11/24/15  Yes Carlis Stable, NP  escitalopram (LEXAPRO) 20 MG tablet Take 30 mg by mouth daily. 1.5 tablet   Yes Historical Provider, MD  methylphenidate (CONCERTA) 36 MG CR tablet Take 36 mg by mouth daily.    Yes Historical Provider, MD  oxybutynin (DITROPAN) 5 MG tablet Take 5 mg by mouth 3 (three) times daily.   Yes Historical Provider, MD  oxyCODONE (OXY IR/ROXICODONE) 5 MG immediate release tablet Take 1 tablet (5 mg total) by mouth every 4 (four) hours as needed for severe pain. 05/02/16  Yes Patrici Ranks, MD  oxyCODONE (OXYCONTIN) 15 mg 12 hr tablet Take 15 mg by mouth every 12 (twelve) hours. For pain levels between 5 or 6   Yes Historical Provider, MD  oxyCODONE (OXYCONTIN) 40 mg 12 hr tablet Take 1 tablet (40 mg total) by mouth every 12 (twelve) hours. 05/10/16  Yes Twana First, MD  traZODone (DESYREL) 150 MG tablet Take 150 mg by mouth at bedtime.    Yes Historical Provider, MD  zolpidem (AMBIEN) 10 MG tablet Take 10 mg by mouth at bedtime.   Yes Historical Provider, MD  lubiprostone (AMITIZA) 8 MCG capsule Take 1 capsule (8 mcg total) by mouth 2 (two) times daily with a meal. 05/11/16   Carlis Stable, NP     Allergies:     Allergies  Allergen Reactions  . Aspirin Nausea And Vomiting  . Codeine Other (See Comments)    Nervousness  . Nsaids Nausea And Vomiting     Physical  Exam:   Vitals  Blood pressure 110/59, pulse 79, temperature 98.9 F (37.2 C), temperature source Oral, resp. rate 15, height 5' 4"  (1.626 m), weight 45.4 kg (100 lb), SpO2 97 %.  1.  General: Appears in no acute distress  2. Psychiatric:  Intact judgement and  insight, awake alert, oriented x 3.  3. Neurologic: No focal neurological deficits, all cranial nerves intact.Strength 5/5 all 4 extremities, sensation intact all 4 extremities, plantars down going.  4. Eyes :  anicteric sclerae, moist conjunctivae with no lid lag. PERRLA.  5. ENMT:  Oropharynx clear with moist mucous membranes and good dentition  6. Neck:  supple, no cervical lymphadenopathy appriciated, No thyromegaly  7. Respiratory : Normal respiratory effort, good air movement bilaterally, chest tube in place  8. Cardiovascular : RRR, no gallops, rubs or murmurs, no leg edema  9. Gastrointestinal:  Positive bowel sounds, abdomen soft, non-tender to palpation,no hepatosplenomegaly, no rigidity or guarding       10. Skin:  No cyanosis, normal texture and turgor, no rash, lesions or ulcers  11.Musculoskeletal:  Good muscle tone,  joints appear normal , no effusions,  normal range of motion    Data Review:    CBC  Recent Labs Lab 05/15/16 1235 05/19/16 2005  WBC 4.3 6.5  HGB 8.9* 8.2*  HCT 28.8* 26.6*  PLT 139* 154  MCV 80.9 82.4  MCH 25.0* 25.4*  MCHC 30.9* 30.8  RDW 15.3* 14.7  LYMPHSABS 602* 0.8  MONOABS 602 0.8  EOSABS 43 0.0  BASOSABS 0 0.0   ------------------------------------------------------------------------------------------------------------------  Chemistries   Recent Labs Lab 05/15/16 1235 05/19/16 2005  NA 138 133*  K 4.4 4.1  CL 99 94*  CO2 33* 34*  GLUCOSE 101* 99  BUN 10 12  CREATININE 0.57 0.56  CALCIUM 9.2 8.5*  AST 20 23  ALT 17 17  ALKPHOS 56 43  BILITOT 0.5 0.4    ------------------------------------------------------------------------------------------------------------------  ------------------------------------------------------------------------------------------------------------------ GFR: Estimated Creatinine Clearance: 51.6 mL/min (by C-G formula based on SCr of 0.56 mg/dL). Liver Function Tests:  Recent Labs Lab 05/15/16 1235 05/19/16 2005  AST 20 23  ALT 17 17  ALKPHOS 56 43  BILITOT 0.5 0.4  PROT 7.0 6.7  ALBUMIN 3.3* 2.7*   No results for input(s): LIPASE, AMYLASE in the last 168 hours. No results for input(s): AMMONIA in the last 168 hours. Coagulation Profile:  Recent Labs Lab 05/15/16 1235  INR 1.1    --------------------------------------------------------------------------------------------------------------- Urine analysis:    Component Value Date/Time   COLORURINE YELLOW 09/25/2007 1437   APPEARANCEUR CLEAR 09/25/2007 1437   LABSPEC 1.015 09/25/2007 1437   PHURINE 6.5 09/25/2007 1437   GLUCOSEU NEGATIVE 09/25/2007 1437   HGBUR NEGATIVE 09/25/2007 1437   BILIRUBINUR NEGATIVE 09/25/2007 1437   [place] 09/25/2007 1437   PROTEINUR NEGATIVE 09/25/2007 1437   UROBILINOGEN 1.0 09/25/2007 1437   NITRITE NEGATIVE 09/25/2007 1437   LEUKOCYTESUR  09/25/2007 1437    NEGATIVE MICROSCOPIC NOT DONE ON URINES WITH NEGATIVE PROTEIN, BLOOD, LEUKOCYTES, NITRITE, OR GLUCOSE <1000 mg/dL.      Imaging Results:    Dg Chest 2 View  Result Date: 05/19/2016 CLINICAL DATA:  Decreased breath sounds EXAM: CHEST  2 VIEW COMPARISON:  05/18/2016 FINDINGS: Cardiac shadow is within normal limits. Left chest wall port is again seen and stable. Small bore left chest tube is noted. Recurrent pneumothorax is seen particularly inferiorly and laterally on the left. Small apical component is noted as well. The right lying remains well aerated. Subcutaneous emphysema is seen. No bony abnormality is noted. IMPRESSION: Recurrent left  pneumothorax. Left chest wall port and small bore chest tube on the left are seen and stable. Critical Value/emergent results were called by telephone at the time of interpretation on 05/19/2016 at 9:07 pm to Dr. Fredia Sorrow , who verbally acknowledged these results. Electronically Signed   By: Inez Hale M.D.   On: 05/19/2016 21:08   Dg Chest 2 View  Result Date: 05/18/2016 CLINICAL DATA:  LEFT pneumothorax, followup EXAM: CHEST  2 VIEW COMPARISON:  05/17/2016 FINDINGS: LEFT subclavian Port-A-Cath with tip projecting over SVC. Small bore LEFT thoracostomy tube unchanged. Normal heart size, mediastinal contours, and pulmonary vascularity. Atherosclerotic calcification aorta. Small residual LEFT apical pneumothorax not significantly changed from previous exam. Chronic bronchitic and interstitial prominence unchanged. Remaining lungs clear. No pleural effusion or acute osseous findings. Again identified soft tissue emphysema at the cervical region and projecting over the LEFT axilla. IMPRESSION: Persistent small LEFT apical pneumothorax not significantly changed. Findings discussed with Dr. Arnoldo Morale on 05/18/2016 at 0940 hours. Electronically Signed   By: Lavonia Dana M.D.   On: 05/18/2016 09:41   Dg Chest Port 1 View  Result Date: 05/19/2016 CLINICAL DATA:  Recurrent pneumothorax EXAM: PORTABLE CHEST 1 VIEW COMPARISON:  02/08/ 2018, 05/19/2016 FINDINGS: Left-sided central venous port tip overlies the SVC. The right lung is grossly clear. A left-sided chest tube has been slightly retracted. Decreased size of previously noted left pneumothorax. Tiny apical residual is suspected. Increased atelectasis at the left lung base. Stable cardiomediastinal silhouette. Possible tiny right effusion. IMPRESSION: 1. Slight retraction of left lower chest tube. Decreased left-sided pneumothorax with tiny apical residual wall visualized. 2. Increased basilar atelectasis on the left. 3. Possible tiny right effusion  Electronically Signed   By: Donavan Foil M.D.   On: 05/19/2016 22:55  My personal review of EKG: Rhythm NSR   Assessment & Plan:    Active Problems:   Squamous cell cancer of hypopharynx (HCC)   Cancer of epiglottis (suprahyoid portion) (HCC)   Pneumothorax on left   Pneumothorax   1. Left pneumothorax- continue chest tube with Pleur-evac suctioning, will monitor with continues pulse ox. General surgery to see the patient in a.m. 2. Squamous cell cancer of hypopharynx- patient followed by oncology in [place]. 3. Chronic pain syndrome-continue OxyContin 15 mg every 12 hours, oxycodone 5 mg every 6 hours when necessary.   DVT Prophylaxis-   Lovenox  AM Labs Ordered, also please review Full Orders  Family Communication: Admission, patients condition and plan of care including tests being ordered have been discussed with the patient and her son at bedside who indicate understanding and agree with the plan and Code Status.  Code Status:  Full code  Admission status: Observation    Time spent in minutes : 60 minutes   Mayreli Alden S M.D on 05/19/2016 at 11:08 PM  Between 7am to 7pm - Pager - [phone]. After 7pm go to [email or web address] - password [facility]  [facility] - Office  [phone]

## 2016-05-19 NOTE — ED Notes (Signed)
Son states that patient has not had her pain medication since 6 am today. Also states that patient has been having abdominal pain. Son wanting to know if she can have liquid medicine through her feeding tube. Will let EDP know of these concerns.

## 2016-05-20 DIAGNOSIS — Z886 Allergy status to analgesic agent status: Secondary | ICD-10-CM | POA: Diagnosis not present

## 2016-05-20 DIAGNOSIS — Z681 Body mass index (BMI) 19 or less, adult: Secondary | ICD-10-CM | POA: Diagnosis not present

## 2016-05-20 DIAGNOSIS — Z87891 Personal history of nicotine dependence: Secondary | ICD-10-CM | POA: Diagnosis not present

## 2016-05-20 DIAGNOSIS — E43 Unspecified severe protein-calorie malnutrition: Secondary | ICD-10-CM | POA: Diagnosis present

## 2016-05-20 DIAGNOSIS — C139 Malignant neoplasm of hypopharynx, unspecified: Secondary | ICD-10-CM | POA: Diagnosis present

## 2016-05-20 DIAGNOSIS — Z90710 Acquired absence of both cervix and uterus: Secondary | ICD-10-CM | POA: Diagnosis not present

## 2016-05-20 DIAGNOSIS — Z79899 Other long term (current) drug therapy: Secondary | ICD-10-CM | POA: Diagnosis not present

## 2016-05-20 DIAGNOSIS — M549 Dorsalgia, unspecified: Secondary | ICD-10-CM | POA: Diagnosis present

## 2016-05-20 DIAGNOSIS — D638 Anemia in other chronic diseases classified elsewhere: Secondary | ICD-10-CM | POA: Diagnosis present

## 2016-05-20 DIAGNOSIS — Z9889 Other specified postprocedural states: Secondary | ICD-10-CM | POA: Diagnosis not present

## 2016-05-20 DIAGNOSIS — G9340 Encephalopathy, unspecified: Secondary | ICD-10-CM | POA: Diagnosis present

## 2016-05-20 DIAGNOSIS — Z809 Family history of malignant neoplasm, unspecified: Secondary | ICD-10-CM | POA: Diagnosis not present

## 2016-05-20 DIAGNOSIS — Z888 Allergy status to other drugs, medicaments and biological substances status: Secondary | ICD-10-CM | POA: Diagnosis not present

## 2016-05-20 DIAGNOSIS — R64 Cachexia: Secondary | ICD-10-CM | POA: Diagnosis present

## 2016-05-20 DIAGNOSIS — Z931 Gastrostomy status: Secondary | ICD-10-CM | POA: Diagnosis not present

## 2016-05-20 DIAGNOSIS — D696 Thrombocytopenia, unspecified: Secondary | ICD-10-CM | POA: Diagnosis present

## 2016-05-20 DIAGNOSIS — G894 Chronic pain syndrome: Secondary | ICD-10-CM | POA: Diagnosis present

## 2016-05-20 DIAGNOSIS — C321 Malignant neoplasm of supraglottis: Secondary | ICD-10-CM | POA: Diagnosis present

## 2016-05-20 DIAGNOSIS — K219 Gastro-esophageal reflux disease without esophagitis: Secondary | ICD-10-CM | POA: Diagnosis present

## 2016-05-20 DIAGNOSIS — J9601 Acute respiratory failure with hypoxia: Secondary | ICD-10-CM | POA: Diagnosis present

## 2016-05-20 DIAGNOSIS — J939 Pneumothorax, unspecified: Secondary | ICD-10-CM | POA: Diagnosis present

## 2016-05-20 DIAGNOSIS — J95811 Postprocedural pneumothorax: Secondary | ICD-10-CM | POA: Diagnosis present

## 2016-05-20 DIAGNOSIS — M19012 Primary osteoarthritis, left shoulder: Secondary | ICD-10-CM | POA: Diagnosis present

## 2016-05-20 LAB — COMPREHENSIVE METABOLIC PANEL
ALT: 15 U/L (ref 14–54)
AST: 20 U/L (ref 15–41)
Albumin: 2.5 g/dL — ABNORMAL LOW (ref 3.5–5.0)
Alkaline Phosphatase: 41 U/L (ref 38–126)
Anion gap: 5 (ref 5–15)
BUN: 10 mg/dL (ref 6–20)
CHLORIDE: 96 mmol/L — AB (ref 101–111)
CO2: 35 mmol/L — AB (ref 22–32)
Calcium: 8.7 mg/dL — ABNORMAL LOW (ref 8.9–10.3)
Creatinine, Ser: 0.45 mg/dL (ref 0.44–1.00)
Glucose, Bld: 108 mg/dL — ABNORMAL HIGH (ref 65–99)
POTASSIUM: 4 mmol/L (ref 3.5–5.1)
SODIUM: 136 mmol/L (ref 135–145)
Total Bilirubin: 0.4 mg/dL (ref 0.3–1.2)
Total Protein: 6.3 g/dL — ABNORMAL LOW (ref 6.5–8.1)

## 2016-05-20 LAB — CBC
HCT: 26.5 % — ABNORMAL LOW (ref 36.0–46.0)
Hemoglobin: 8.2 g/dL — ABNORMAL LOW (ref 12.0–15.0)
MCH: 25.5 pg — AB (ref 26.0–34.0)
MCHC: 30.9 g/dL (ref 30.0–36.0)
MCV: 82.6 fL (ref 78.0–100.0)
PLATELETS: 129 10*3/uL — AB (ref 150–400)
RBC: 3.21 MIL/uL — ABNORMAL LOW (ref 3.87–5.11)
RDW: 14.7 % (ref 11.5–15.5)
WBC: 4.4 10*3/uL (ref 4.0–10.5)

## 2016-05-20 MED ORDER — PRO-STAT SUGAR FREE PO LIQD
30.0000 mL | Freq: Every day | ORAL | Status: DC
  Administered 2016-05-20 – 2016-05-25 (×6): 30 mL
  Filled 2016-05-20 (×6): qty 30

## 2016-05-20 MED ORDER — OSMOLITE 1.5 CAL PO LIQD
360.0000 mL | Freq: Three times a day (TID) | ORAL | Status: DC
  Administered 2016-05-20: 360 mL
  Administered 2016-05-20: 16:00:00
  Administered 2016-05-20 – 2016-05-23 (×4): 360 mL
  Filled 2016-05-20 (×13): qty 474

## 2016-05-20 MED ORDER — CHLORHEXIDINE GLUCONATE CLOTH 2 % EX PADS
6.0000 | MEDICATED_PAD | Freq: Every day | CUTANEOUS | Status: AC
  Administered 2016-05-20 – 2016-05-24 (×5): 6 via TOPICAL

## 2016-05-20 MED ORDER — MUPIROCIN 2 % EX OINT
1.0000 "application " | TOPICAL_OINTMENT | Freq: Two times a day (BID) | CUTANEOUS | Status: AC
  Administered 2016-05-20 – 2016-05-24 (×10): 1 via NASAL
  Filled 2016-05-20: qty 22

## 2016-05-20 MED ORDER — DICYCLOMINE HCL 10 MG PO CAPS
10.0000 mg | ORAL_CAPSULE | Freq: Four times a day (QID) | ORAL | Status: DC | PRN
  Administered 2016-05-21 – 2016-05-22 (×2): 10 mg via ORAL
  Filled 2016-05-20 (×2): qty 1

## 2016-05-20 MED ORDER — BOOST HIGH PROTEIN PO LIQD
237.0000 mL | Freq: Three times a day (TID) | ORAL | Status: DC
  Administered 2016-05-21 – 2016-05-25 (×11): 237 mL via ORAL
  Filled 2016-05-20 (×17): qty 237

## 2016-05-20 MED ORDER — MORPHINE SULFATE (PF) 2 MG/ML IV SOLN
1.0000 mg | INTRAVENOUS | Status: DC | PRN
  Filled 2016-05-20: qty 1

## 2016-05-20 MED ORDER — LUBIPROSTONE 24 MCG PO CAPS
24.0000 ug | ORAL_CAPSULE | Freq: Two times a day (BID) | ORAL | Status: DC
  Administered 2016-05-20 – 2016-05-25 (×10): 24 ug via ORAL
  Filled 2016-05-20 (×11): qty 1

## 2016-05-20 NOTE — Progress Notes (Signed)
PROGRESS NOTE    Tiffany Hale  [id number] DOB: [birth year] DOA: 05/19/2016 PCP: [name], NP    Brief Narrative:  61 year old female with a history of squamous cell cancer of hypopharynx, recently had Port-A-Cath and PEG tube placed on 05/17/16. She had a small pneumothorax developed after Port-A-Cath placement. She underwent chest tube placement by surgery with improvement of pneumothorax. The patient was discharged home, but became increasingly drowsy. She came back to the emergency room where imaging indicated recurrent left pneumothorax. Chest tube was placed to suction with resolution of pneumothorax. General surgery following for chest tube management.   Assessment & Plan:   Active Problems:   Squamous cell cancer of hypopharynx (HCC)   Cancer of epiglottis (suprahyoid portion) (HCC)   Pneumothorax on left   Pneumothorax   Protein-calorie malnutrition, severe   1. Recurrent left pneumothorax. Status post chest tube. Follow-up imaging indicate improvement of pneumothorax. Genera surgery following and will be managing chest tube.  2. Squamous cell cancer of hypopharynx. Followed by oncology.  3. Acute encephalopathy. Patient was very lethargic prior to admission. Possibly related to polypharmacy and multiple sedating medications. No signs of infection.  4. Severe malnutrition. Patient has PEG tube placed for nutrition. She is on tube feeding, but has been reluctant to take tube feeds since it causes her pain. May benefit from continuous feeding. Will discuss with nutrition.  5. Anemia. Likely related to chronic disease from underlying malignancy. Hemoglobin has been stable.    DVT prophylaxis: Lovenox Code Status: Full code Family Communication: No family present Disposition Plan: Discharge home once improved   Consultants:   Gen. surgery  Procedures:     Antimicrobials:       Subjective: Does not feel short of breath. No chest  pain.  Objective: Vitals:   05/19/16 2343 05/20/16 0003 05/20/16 0534 05/20/16 1322  BP:  (!) 123/51 123/60 118/60  Pulse:  77 76 81  Resp:  16 16 16   Temp:  98.9 F (37.2 C) 98.4 F (36.9 C) 98.5 F (36.9 C)  TempSrc:  Oral Oral Oral  SpO2: 100% 100% 100% 99%  Weight:  49.3 kg (108 lb 11 oz)    Height:        Intake/Output Summary (Last 24 hours) at 05/20/16 1741 Last data filed at 05/20/16 1708  Gross per 24 hour  Intake          2064.34 ml  Output              650 ml  Net          1414.34 ml   Filed Weights   05/19/16 1855 05/20/16 0003  Weight: 45.4 kg (100 lb) 49.3 kg (108 lb 11 oz)    Examination:  General exam: Appears calm and comfortable, cachetic  Respiratory system: Clear to auscultation. Respiratory effort normal. Left chest tube Cardiovascular system: S1 & S2 heard, RRR. No JVD, murmurs, rubs, gallops or clicks. No pedal edema. Gastrointestinal system: Abdomen is nondistended, soft and nontender. No organomegaly or masses felt. Normal bowel sounds heard. PEG tube in place Central nervous system: Alert and oriented. No focal neurological deficits. Extremities: Symmetric 5 x 5 power. Skin: No rashes, lesions or ulcers Psychiatry: Judgement and insight appear normal. Mood & affect appropriate.     Data Reviewed: I have personally reviewed following labs and imaging studies  CBC:  Recent Labs Lab 05/15/16 1235 05/19/16 2005 05/20/16 0552  WBC 4.3 6.5 4.4  NEUTROABS 3,053 4.9  --  HGB 8.9* 8.2* 8.2*  HCT 28.8* 26.6* 26.5*  MCV 80.9 82.4 82.6  PLT 139* 154 [id number]*   Basic Metabolic Panel:  Recent Labs Lab 05/15/16 1235 05/19/16 2005 05/20/16 0552  NA 138 133* 136  K 4.4 4.1 4.0  CL 99 94* 96*  CO2 33* 34* 35*  GLUCOSE 101* 99 108*  BUN 10 12 10   CREATININE 0.57 0.56 0.45  CALCIUM 9.2 8.5* 8.7*   GFR: Estimated Creatinine Clearance: 56 mL/min (by C-G formula based on SCr of 0.45 mg/dL). Liver Function Tests:  Recent Labs Lab  05/15/16 1235 05/19/16 2005 05/20/16 0552  AST 20 23 20   ALT 17 17 15   ALKPHOS 56 43 41  BILITOT 0.5 0.4 0.4  PROT 7.0 6.7 6.3*  ALBUMIN 3.3* 2.7* 2.5*   No results for input(s): LIPASE, AMYLASE in the last 168 hours. No results for input(s): AMMONIA in the last 168 hours. Coagulation Profile:  Recent Labs Lab 05/15/16 1235  INR 1.1   Cardiac Enzymes: No results for input(s): CKTOTAL, CKMB, CKMBINDEX, TROPONINI in the last 168 hours. BNP (last 3 results) No results for input(s): PROBNP in the last 8760 hours. HbA1C: No results for input(s): HGBA1C in the last 72 hours. CBG: No results for input(s): GLUCAP in the last 168 hours. Lipid Profile: No results for input(s): CHOL, HDL, LDLCALC, TRIG, CHOLHDL, LDLDIRECT in the last 72 hours. Thyroid Function Tests: No results for input(s): TSH, T4TOTAL, FREET4, T3FREE, THYROIDAB in the last 72 hours. Anemia Panel: No results for input(s): VITAMINB12, FOLATE, FERRITIN, TIBC, IRON, RETICCTPCT in the last 72 hours. Sepsis Labs: No results for input(s): PROCALCITON, LATICACIDVEN in the last 168 hours.  Recent Results (from the past 240 hour(s))  Surgical pcr screen     Status: Abnormal   Collection Time: 05/15/16  1:02 PM  Result Value Ref Range Status   MRSA, PCR POSITIVE (A) NEGATIVE Final    Comment: RESULT CALLED TO, READ BACK BY AND VERIFIED WITH: LEFT MESSAGE SHORT STAY AT 1600 BY THOMPSON S. RESULT CALLED TO, READ BACK BY AND VERIFIED WITH: LEFT MESSAGE SHORT STAY AT 1600 ON [id number] BY THOMPSON S.    Staphylococcus aureus POSITIVE (A) NEGATIVE Final    Comment:        The Xpert SA Assay (FDA approved for NASAL specimens in patients over 60 years of age), is one component of a comprehensive surveillance program.  Test performance has been validated by [facility] for patients greater than or equal to 65 year old. It is not intended to diagnose infection nor to guide or monitor treatment. RESULT CALLED TO, READ BACK  BY AND VERIFIED WITH: LEFT MESSAGE SHORT STAY AT 1600 ON [id number] BY THOMPSON S.          Radiology Studies: Dg Chest 2 View  Result Date: 05/19/2016 CLINICAL DATA:  Decreased breath sounds EXAM: CHEST  2 VIEW COMPARISON:  05/18/2016 FINDINGS: Cardiac shadow is within normal limits. Left chest wall port is again seen and stable. Small bore left chest tube is noted. Recurrent pneumothorax is seen particularly inferiorly and laterally on the left. Small apical component is noted as well. The right lying remains well aerated. Subcutaneous emphysema is seen. No bony abnormality is noted. IMPRESSION: Recurrent left pneumothorax. Left chest wall port and small bore chest tube on the left are seen and stable. Critical Value/emergent results were called by telephone at the time of interpretation on 05/19/2016 at 9:07 pm to Dr. Fredia Sorrow , who verbally acknowledged these  results. Electronically Signed   By: Inez Catalina M.D.   On: 05/19/2016 21:08   Dg Chest Port 1 View  Result Date: 05/19/2016 CLINICAL DATA:  Recurrent pneumothorax EXAM: PORTABLE CHEST 1 VIEW COMPARISON:  02/08/ 2018, 05/19/2016 FINDINGS: Left-sided central venous port tip overlies the SVC. The right lung is grossly clear. A left-sided chest tube has been slightly retracted. Decreased size of previously noted left pneumothorax. Tiny apical residual is suspected. Increased atelectasis at the left lung base. Stable cardiomediastinal silhouette. Possible tiny right effusion. IMPRESSION: 1. Slight retraction of left lower chest tube. Decreased left-sided pneumothorax with tiny apical residual wall visualized. 2. Increased basilar atelectasis on the left. 3. Possible tiny right effusion Electronically Signed   By: Donavan Foil M.D.   On: 05/19/2016 22:55        Scheduled Meds: . ARIPiprazole  15 mg Oral Daily  . Chlorhexidine Gluconate Cloth  6 each Topical Q0600  . enoxaparin (LOVENOX) injection  40 mg Subcutaneous Q24H  .  escitalopram  30 mg Oral Daily  . feeding supplement (OSMOLITE 1.5 CAL)  360 mL Per Tube TID  . feeding supplement (PRO-STAT SUGAR FREE 64)  30 mL Per Tube Daily  . [START ON 05/21/2016] lactose free nutrition  237 mL Oral TID WC  . lubiprostone  24 mcg Oral BID WC  . methylphenidate  36 mg Oral Daily  . mupirocin ointment  1 application Nasal BID  . oxybutynin  5 mg Oral TID  . oxyCODONE  15 mg Oral Q12H  . zolpidem  5 mg Oral QHS   Continuous Infusions: . sodium chloride 10 mL/hr at 05/20/16 0235     LOS: 0 days    Time spent: 65mins    Yaresly Menzel, MD [facility] Pager [phone]  If 7PM-7AM, please contact night-coverage [email or web address] Password [facility] 05/20/2016, 5:41 PM

## 2016-05-20 NOTE — Progress Notes (Signed)
Subjective: Patient well known to me. Status post PEG/Port-A-Cath placement on [id number] complicated by postprocedure left pneumothorax. Small bore chest tube was placed and patient insisted on going home. She had a follow-up chest x-ray on 05/18/2016 which showed no significant left pneumothorax. Yesterday, they contacted me and said that she was groggy and difficult to arouse. I told them the come to the emergency room. In the emergency room, she was noted to have a recurrent left pneumothorax. He was placed to suction which resulted in resolution of the left pneumothorax. She was admitted to the hospital for ongoing treatment.  Objective: Vital signs in last 24 hours: Temp:  [98.4 F (36.9 C)-98.9 F (37.2 C)] 98.4 F (36.9 C) (02/10 0534) Pulse Rate:  [73-86] 76 (02/10 0534) Resp:  [10-20] 16 (02/10 0534) BP: (98-123)/(51-64) 123/60 (02/10 0534) SpO2:  [77 %-100 %] 100 % (02/10 0534) FiO2 (%):  [0 %] 0 % (02/09 2343) Weight:  [45.4 kg (100 lb)-49.3 kg (108 lb 11 oz)] 49.3 kg (108 lb 11 oz) (02/10 0003)    Intake/Output from previous day: 02/09 0701 - 02/10 0700 In: 1306.7 [P.O.:240; I.V.:1066.7] Out: 400 [Urine:400] Intake/Output this shift: No intake/output data recorded.  General appearance: alert, cooperative, cachectic and no distress Resp: clear to auscultation bilaterally and Pleuro-Vac has no air leak. It was disconnected but I reconnected it.  Lab Results:   Recent Labs  05/19/16 2005 05/20/16 0552  WBC 6.5 4.4  HGB 8.2* 8.2*  HCT 26.6* 26.5*  PLT 154 129*   BMET  Recent Labs  05/19/16 2005 05/20/16 0552  NA 133* 136  K 4.1 4.0  CL 94* 96*  CO2 34* 35*  GLUCOSE 99 108*  BUN 12 10  CREATININE 0.56 0.45  CALCIUM 8.5* 8.7*   PT/INR No results for input(s): LABPROT, INR in the last 72 hours.  Studies/Results: Dg Chest 2 View  Result Date: 05/19/2016 CLINICAL DATA:  Decreased breath sounds EXAM: CHEST  2 VIEW COMPARISON:  05/18/2016 FINDINGS:  Cardiac shadow is within normal limits. Left chest wall port is again seen and stable. Small bore left chest tube is noted. Recurrent pneumothorax is seen particularly inferiorly and laterally on the left. Small apical component is noted as well. The right lying remains well aerated. Subcutaneous emphysema is seen. No bony abnormality is noted. IMPRESSION: Recurrent left pneumothorax. Left chest wall port and small bore chest tube on the left are seen and stable. Critical Value/emergent results were called by telephone at the time of interpretation on 05/19/2016 at 9:07 pm to Dr. Fredia Sorrow , who verbally acknowledged these results. Electronically Signed   By: Inez Catalina M.D.   On: 05/19/2016 21:08   Dg Chest 2 View  Result Date: 05/18/2016 CLINICAL DATA:  LEFT pneumothorax, followup EXAM: CHEST  2 VIEW COMPARISON:  05/17/2016 FINDINGS: LEFT subclavian Port-A-Cath with tip projecting over SVC. Small bore LEFT thoracostomy tube unchanged. Normal heart size, mediastinal contours, and pulmonary vascularity. Atherosclerotic calcification aorta. Small residual LEFT apical pneumothorax not significantly changed from previous exam. Chronic bronchitic and interstitial prominence unchanged. Remaining lungs clear. No pleural effusion or acute osseous findings. Again identified soft tissue emphysema at the cervical region and projecting over the LEFT axilla. IMPRESSION: Persistent small LEFT apical pneumothorax not significantly changed. Findings discussed with Dr. Arnoldo Morale on 05/18/2016 at 0940 hours. Electronically Signed   By: Lavonia Dana M.D.   On: 05/18/2016 09:41   Dg Chest Port 1 View  Result Date: 05/19/2016 CLINICAL DATA:  Recurrent  pneumothorax EXAM: PORTABLE CHEST 1 VIEW COMPARISON:  02/08/ 2018, 05/19/2016 FINDINGS: Left-sided central venous port tip overlies the SVC. The right lung is grossly clear. A left-sided chest tube has been slightly retracted. Decreased size of previously noted left pneumothorax.  Tiny apical residual is suspected. Increased atelectasis at the left lung base. Stable cardiomediastinal silhouette. Possible tiny right effusion. IMPRESSION: 1. Slight retraction of left lower chest tube. Decreased left-sided pneumothorax with tiny apical residual wall visualized. 2. Increased basilar atelectasis on the left. 3. Possible tiny right effusion Electronically Signed   By: Donavan Foil M.D.   On: 05/19/2016 22:55    Anti-infectives: Anti-infectives    None      Assessment/Plan: Impression: Recurrent left pneumothorax, resolved. Patient failed outpatient Heimlich tube management. We will continue -40 cm suction through the Pleur-evac for a few days. Repeat chest x-ray has been ordered for morning. Patient will be nutrition consult to start PEG tube feedings. The port is available to be accessed if needed.  LOS: 0 days    Keaisha Sublette A 05/20/2016

## 2016-05-20 NOTE — Progress Notes (Signed)
Small healed 1cm decubitus on coccyx area Allevyn Life foam pad applied.

## 2016-05-20 NOTE — Progress Notes (Signed)
Patient is c/o of pain unrelieved by medications. Normally takes Oxycontin 40mg  q 12 hours at home. While in the hospital she has Oxycontin 15mg  q 12 hours ordered and Oxy IR 5mg  q 4 hours prn. MD paged and orders given for Morphine 1 mg q 4hours prn. Explained to patient and son about pain medications and adding on Morphine. Pt's son refused for her to have the Morphine and stated that he would address with MD in the AM.

## 2016-05-20 NOTE — Progress Notes (Signed)
Initial Nutrition Assessment  DOCUMENTATION CODES:  Severe malnutrition in context of chronic illness, Underweight   Pt meets criteria for SEVERE MALNUTRITION in the context of Chronic Illness as evidenced by Severe muscle loss and wt loss of >10% bw x 6 months.  INTERVENTION:   Patient has not yet been evaluated by ST. She has trouble swallowing secondary to hypopharyngeal tumour. Was planned to be at outpatient [facility] next Tuesday. Recommend inpatient ST consult.  If deemed safe to swallow. Ensure Enlive po BID, each supplement provides 350 kcal and 20 grams of protein.   To meet 100% needs, Bolus 360 cc Osmolite 1.5 (1.2 not on formulary) TID via Peg. Prostat 30 ml Daily. Flush with 30cc before and after feed. (if being supported with IVF, flush with 150cc before and after if NOT receiving IVF). Provides 1720 kcals, 83 g Pro, 823 cc fluid.   NUTRITION DIAGNOSIS:  Increased nutrient needs related to cancer and cancer related treatments as evidenced by estimated nutritional requirements for the condition  GOAL:  Patient will meet greater than or equal to 90% of their needs  MONITOR:  Vent status, Supplement acceptance, Labs, Weight trends  REASON FOR ASSESSMENT:  Consult Enteral/tube feeding initiation and management  ASSESSMENT:  61 y/o female PMHx Anxiety, depression Hep A, Hep C, Cirrhosis, Tobacco abuse, GERD and recent dx with Squamous cell carcinoma of hypopharynx. Underwent Cath/Peg placement on 05/17/16 with compliaction of pneumothorax. Had Chest tube placed and went home. Represented with confusion   RD operating remotely, but Patient well known to RD. She has been followed closely since her presentation as new H&N cancer at [facility]. Had been scheduled to start tube feeding on Thursday, 24 hours after PEG had been placed.   Per son, since she was initially seen outpatient, her ability to swallow as worsened and pt was essentially relying on  Boost/ensure for all her nutritional needs.   The following tube feed [facility]:   6 Cans Osmolite 1.2, split into 3 bolus feeds, 2 cans each, through PEG. Flush with 90 cc before and after each feed. Total feed volume 654 ml or ~22 oz  Will implement similar TF while admitted.   She has yet to be seen by speech.   Physical Exam: Can not be conducted today, but from previous encounters, exhibits moderate-severe fat/muscle wasting.   Using her dosing weight of 103 lbs. She has lost 16 lbs since August, a loss of 13% bw in 6 months.   Medications: IVF Labs: Albumin: 2.5, H/H:8.2/26.5,    Recent Labs Lab 05/15/16 1235 05/19/16 2005 05/20/16 0552  NA 138 133* 136  K 4.4 4.1 4.0  CL 99 94* 96*  CO2 33* 34* 35*  BUN 10 12 10   CREATININE 0.57 0.56 0.45  CALCIUM 9.2 8.5* 8.7*  GLUCOSE 101* 99 108*   Diet Order:  Diet regular Room service appropriate? Yes; Fluid consistency: Thin  Skin: Chest/throat surgical incision  Last BM:  Unknown  Height:  Ht Readings from Last 1 Encounters:  05/19/16 5\' 4"  (1.626 m)   Weight:  Wt Readings from Last 1 Encounters:  05/20/16 108 lb 11 oz (49.3 kg)   Wt Readings from Last 10 Encounters:  05/20/16 108 lb 11 oz (49.3 kg)  05/15/16 103 lb (46.7 kg)  05/11/16 105 lb 12.8 oz (48 kg)  05/10/16 108 lb 11 oz (49.3 kg)  05/09/16 106 lb (48.1 kg)  05/08/16  107 lb 3.2 oz (48.6 kg)  05/02/16 106 lb 8 oz (48.3 kg)  03/01/16 107 lb (48.5 kg)  11/16/15 119 lb (54 kg)  11/12/15 119 lb (54 kg)   Ideal Body Weight:  54.54 kg  BMI:  Body mass index is 18.66 kg/m.  Dosing weight; 103 lbs (46.82 kg)  Estimated Nutritional Needs:  Kcal:  1650-1850 kcals (35-40 kcal/kg bw) Protein:  70-84 g Pro (1.5-1.8 g/kg bw) Fluid:  1.7-1.9 L fluid (27ml/kcal)  EDUCATION NEEDS:  No education needs identified at this time  Burtis Junes RD, LDN, [facility] Pager: [phone] 05/20/2016 10:42 AM

## 2016-05-21 ENCOUNTER — Inpatient Hospital Stay (HOSPITAL_COMMUNITY): Payer: Medicaid Other

## 2016-05-21 LAB — URINALYSIS, ROUTINE W REFLEX MICROSCOPIC
BILIRUBIN URINE: NEGATIVE
Glucose, UA: NEGATIVE mg/dL
HGB URINE DIPSTICK: NEGATIVE
Ketones, ur: NEGATIVE mg/dL
Leukocytes, UA: NEGATIVE
Nitrite: NEGATIVE
PH: 9 — AB (ref 5.0–8.0)
Protein, ur: NEGATIVE mg/dL
Specific Gravity, Urine: 1.009 (ref 1.005–1.030)

## 2016-05-21 MED ORDER — OXYCODONE HCL ER 15 MG PO T12A
30.0000 mg | EXTENDED_RELEASE_TABLET | Freq: Two times a day (BID) | ORAL | Status: DC
  Administered 2016-05-21 – 2016-05-25 (×8): 30 mg via ORAL
  Filled 2016-05-21 (×8): qty 2

## 2016-05-21 NOTE — Progress Notes (Signed)
PROGRESS NOTE    Tiffany Hale  [id number] DOB: [birth year] DOA: 05/19/2016 PCP: [name], NP    Brief Narrative:  61 year old female with a history of squamous cell cancer of hypopharynx, recently had Port-A-Cath and PEG tube placed on 05/17/16. She had a small pneumothorax developed after Port-A-Cath placement. She underwent chest tube placement by surgery with improvement of pneumothorax. The patient was discharged home, but became increasingly drowsy. She came back to the emergency room where imaging indicated recurrent left pneumothorax. Chest tube was placed to suction with resolution of pneumothorax. General surgery following for chest tube management.   Assessment & Plan:   Active Problems:   Squamous cell cancer of hypopharynx (HCC)   Cancer of epiglottis (suprahyoid portion) (HCC)   Pneumothorax on left   Pneumothorax   Protein-calorie malnutrition, severe   1. Recurrent left pneumothorax. Status post chest tube. Follow-up imaging indicate improvement of pneumothorax. General surgery following and is managing chest tube.  2. Squamous cell cancer of hypopharynx. Followed by oncology.  3. Acute encephalopathy. Patient was very lethargic prior to admission. Possibly related to polypharmacy and multiple sedating medications. No signs of infection. Mental status has since improved.  4. Severe malnutrition. Patient has PEG tube placed for nutrition. She is on tube feeding, but has been reluctant to take tube feeds since it causes her pain. May benefit from continuous feeding. Will discuss with nutrition.  5. Anemia. Likely related to chronic disease from underlying malignancy. Hemoglobin has been stable.  6. Chronic pain syndrome. Patient is on OxyContin and oxycodone. Resume home dose of OxyContin at reduced dose, since she appears to be quite uncomfortable.  7. Fever. Patient has low-grade fever of 100.6. Chest x-ray does not show any evidence of pneumonia. Check  urine .    DVT prophylaxis: Lovenox Code Status: Full code Family Communication: No family present Disposition Plan: Discharge home once improved   Consultants:   Gen. surgery  Procedures:     Antimicrobials:       Subjective: Has occasional cough which is nonproductive, no shortness of breath.  Objective: Vitals:   05/20/16 1322 05/20/16 2110 05/21/16 0525 05/21/16 1500  BP: 118/60 137/60 (!) 148/80 131/66  Pulse: 81 76 93 93  Resp: 16 16 16 16   Temp: 98.5 F (36.9 C) 98.5 F (36.9 C) 98.4 F (36.9 C) (!) 100.6 F (38.1 C)  TempSrc: Oral Oral Oral Oral  SpO2: 99% 95% 100% 94%  Weight:      Height:        Intake/Output Summary (Last 24 hours) at 05/21/16 1824 Last data filed at 05/21/16 1700  Gross per 24 hour  Intake           306.33 ml  Output             1550 ml  Net         -1243.67 ml   Filed Weights   05/19/16 1855 05/20/16 0003  Weight: 45.4 kg (100 lb) 49.3 kg (108 lb 11 oz)    Examination:  General exam: Appears calm and comfortable, cachetic  Respiratory system: Clear to auscultation. Respiratory effort normal. Left chest tube Cardiovascular system: S1 & S2 heard, RRR. No JVD, murmurs, rubs, gallops or clicks. No pedal edema. Gastrointestinal system: Abdomen is nondistended, soft and nontender. No organomegaly or masses felt. Normal bowel sounds heard. PEG tube in place Central nervous system: Alert and oriented. No focal neurological deficits. Extremities: Symmetric 5 x 5 power. Skin: No rashes, lesions  or ulcers Psychiatry: Judgement and insight appear normal. Mood & affect appropriate.     Data Reviewed: I have personally reviewed following labs and imaging studies  CBC:  Recent Labs Lab 05/15/16 1235 05/19/16 2005 05/20/16 0552  WBC 4.3 6.5 4.4  NEUTROABS 3,053 4.9  --   HGB 8.9* 8.2* 8.2*  HCT 28.8* 26.6* 26.5*  MCV 80.9 82.4 82.6  PLT 139* 154 [id number]*   Basic Metabolic Panel:  Recent Labs Lab 05/15/16 1235  05/19/16 2005 05/20/16 0552  NA 138 133* 136  K 4.4 4.1 4.0  CL 99 94* 96*  CO2 33* 34* 35*  GLUCOSE 101* 99 108*  BUN 10 12 10   CREATININE 0.57 0.56 0.45  CALCIUM 9.2 8.5* 8.7*   GFR: Estimated Creatinine Clearance: 56 mL/min (by C-G formula based on SCr of 0.45 mg/dL). Liver Function Tests:  Recent Labs Lab 05/15/16 1235 05/19/16 2005 05/20/16 0552  AST 20 23 20   ALT 17 17 15   ALKPHOS 56 43 41  BILITOT 0.5 0.4 0.4  PROT 7.0 6.7 6.3*  ALBUMIN 3.3* 2.7* 2.5*   No results for input(s): LIPASE, AMYLASE in the last 168 hours. No results for input(s): AMMONIA in the last 168 hours. Coagulation Profile:  Recent Labs Lab 05/15/16 1235  INR 1.1   Cardiac Enzymes: No results for input(s): CKTOTAL, CKMB, CKMBINDEX, TROPONINI in the last 168 hours. BNP (last 3 results) No results for input(s): PROBNP in the last 8760 hours. HbA1C: No results for input(s): HGBA1C in the last 72 hours. CBG: No results for input(s): GLUCAP in the last 168 hours. Lipid Profile: No results for input(s): CHOL, HDL, LDLCALC, TRIG, CHOLHDL, LDLDIRECT in the last 72 hours. Thyroid Function Tests: No results for input(s): TSH, T4TOTAL, FREET4, T3FREE, THYROIDAB in the last 72 hours. Anemia Panel: No results for input(s): VITAMINB12, FOLATE, FERRITIN, TIBC, IRON, RETICCTPCT in the last 72 hours. Sepsis Labs: No results for input(s): PROCALCITON, LATICACIDVEN in the last 168 hours.  Recent Results (from the past 240 hour(s))  Surgical pcr screen     Status: Abnormal   Collection Time: 05/15/16  1:02 PM  Result Value Ref Range Status   MRSA, PCR POSITIVE (A) NEGATIVE Final    Comment: RESULT CALLED TO, READ BACK BY AND VERIFIED WITH: LEFT MESSAGE SHORT STAY AT 1600 BY THOMPSON S. RESULT CALLED TO, READ BACK BY AND VERIFIED WITH: LEFT MESSAGE SHORT STAY AT 1600 ON [id number] BY THOMPSON S.    Staphylococcus aureus POSITIVE (A) NEGATIVE Final    Comment:        The Xpert SA Assay (FDA approved  for NASAL specimens in patients over 42 years of age), is one component of a comprehensive surveillance program.  Test performance has been validated by [facility] for patients greater than or equal to 68 year old. It is not intended to diagnose infection nor to guide or monitor treatment. RESULT CALLED TO, READ BACK BY AND VERIFIED WITH: LEFT MESSAGE SHORT STAY AT 1600 ON [id number] BY THOMPSON S.          Radiology Studies: Dg Chest 2 View  Result Date: 05/19/2016 CLINICAL DATA:  Decreased breath sounds EXAM: CHEST  2 VIEW COMPARISON:  05/18/2016 FINDINGS: Cardiac shadow is within normal limits. Left chest wall port is again seen and stable. Small bore left chest tube is noted. Recurrent pneumothorax is seen particularly inferiorly and laterally on the left. Small apical component is noted as well. The right lying remains well aerated. Subcutaneous emphysema is  seen. No bony abnormality is noted. IMPRESSION: Recurrent left pneumothorax. Left chest wall port and small bore chest tube on the left are seen and stable. Critical Value/emergent results were called by telephone at the time of interpretation on 05/19/2016 at 9:07 pm to Dr. Fredia Sorrow , who verbally acknowledged these results. Electronically Signed   By: Inez Catalina M.D.   On: 05/19/2016 21:08   Dg Chest Port 1 View  Result Date: 05/21/2016 CLINICAL DATA:  Left pneumothorax EXAM: PORTABLE CHEST 1 VIEW COMPARISON:  05/19/2016 FINDINGS: Small left apical pneumothorax again noted, unchanged. Small bore left chest tube is in stable position. There is hyperinflation of the lungs compatible with COPD. Left Port-A-Cath is unchanged. Heart is normal size. No confluent opacities. IMPRESSION: COPD. Stable tiny left apical pneumothorax with small bore left chest tube in place. Electronically Signed   By: Rolm Baptise M.D.   On: 05/21/2016 07:44   Dg Chest Port 1 View  Result Date: 05/19/2016 CLINICAL DATA:  Recurrent pneumothorax EXAM:  PORTABLE CHEST 1 VIEW COMPARISON:  02/08/ 2018, 05/19/2016 FINDINGS: Left-sided central venous port tip overlies the SVC. The right lung is grossly clear. A left-sided chest tube has been slightly retracted. Decreased size of previously noted left pneumothorax. Tiny apical residual is suspected. Increased atelectasis at the left lung base. Stable cardiomediastinal silhouette. Possible tiny right effusion. IMPRESSION: 1. Slight retraction of left lower chest tube. Decreased left-sided pneumothorax with tiny apical residual wall visualized. 2. Increased basilar atelectasis on the left. 3. Possible tiny right effusion Electronically Signed   By: Donavan Foil M.D.   On: 05/19/2016 22:55        Scheduled Meds: . ARIPiprazole  15 mg Oral Daily  . Chlorhexidine Gluconate Cloth  6 each Topical Q0600  . enoxaparin (LOVENOX) injection  40 mg Subcutaneous Q24H  . escitalopram  30 mg Oral Daily  . feeding supplement  237 mL Oral TID WC  . feeding supplement (OSMOLITE 1.5 CAL)  360 mL Per Tube TID  . feeding supplement (PRO-STAT SUGAR FREE 64)  30 mL Per Tube Daily  . lubiprostone  24 mcg Oral BID WC  . mupirocin ointment  1 application Nasal BID  . oxybutynin  5 mg Oral TID  . oxyCODONE  30 mg Oral Q12H  . zolpidem  5 mg Oral QHS   Continuous Infusions: . sodium chloride 10 mL/hr at 05/20/16 0235     LOS: 1 day    Time spent: 10mins    MEMON,JEHANZEB, MD [facility] Pager [phone]  If 7PM-7AM, please contact night-coverage [email or web address] Password [facility] 05/21/2016, 6:24 PM

## 2016-05-21 NOTE — Progress Notes (Signed)
Subjective: Patient wants to go home. She denies any shortness of breath. She does feel full when tube feeds are administered.  Objective: Vital signs in last 24 hours: Temp:  [98.4 F (36.9 C)-98.5 F (36.9 C)] 98.4 F (36.9 C) (02/11 0525) Pulse Rate:  [76-93] 93 (02/11 0525) Resp:  [16] 16 (02/11 0525) BP: (118-148)/(60-80) 148/80 (02/11 0525) SpO2:  [95 %-100 %] 100 % (02/11 0525) Last BM Date: 05/20/16  Intake/Output from previous day: 02/10 0701 - 02/11 0700 In: 924 [P.O.:240; I.V.:274; NG/GT:250] Out: 1250 [Urine:1250] Intake/Output this shift: Total I/O In: -  Out: 200 [Urine:200]  General appearance: cooperative, cachectic and no distress Resp: clear to auscultation bilaterally and Chest tube in place without air leak.  Lab Results:   Recent Labs  05/19/16 2005 05/20/16 0552  WBC 6.5 4.4  HGB 8.2* 8.2*  HCT 26.6* 26.5*  PLT 154 129*   BMET  Recent Labs  05/19/16 2005 05/20/16 0552  NA 133* 136  K 4.1 4.0  CL 94* 96*  CO2 34* 35*  GLUCOSE 99 108*  BUN 12 10  CREATININE 0.56 0.45  CALCIUM 8.5* 8.7*   PT/INR No results for input(s): LABPROT, INR in the last 72 hours.  Studies/Results: Dg Chest 2 View  Result Date: 05/19/2016 CLINICAL DATA:  Decreased breath sounds EXAM: CHEST  2 VIEW COMPARISON:  05/18/2016 FINDINGS: Cardiac shadow is within normal limits. Left chest wall port is again seen and stable. Small bore left chest tube is noted. Recurrent pneumothorax is seen particularly inferiorly and laterally on the left. Small apical component is noted as well. The right lying remains well aerated. Subcutaneous emphysema is seen. No bony abnormality is noted. IMPRESSION: Recurrent left pneumothorax. Left chest wall port and small bore chest tube on the left are seen and stable. Critical Value/emergent results were called by telephone at the time of interpretation on 05/19/2016 at 9:07 pm to Dr. Fredia Sorrow , who verbally acknowledged these results.  Electronically Signed   By: Inez Catalina M.D.   On: 05/19/2016 21:08   Dg Chest Port 1 View  Result Date: 05/21/2016 CLINICAL DATA:  Left pneumothorax EXAM: PORTABLE CHEST 1 VIEW COMPARISON:  05/19/2016 FINDINGS: Small left apical pneumothorax again noted, unchanged. Small bore left chest tube is in stable position. There is hyperinflation of the lungs compatible with COPD. Left Port-A-Cath is unchanged. Heart is normal size. No confluent opacities. IMPRESSION: COPD. Stable tiny left apical pneumothorax with small bore left chest tube in place. Electronically Signed   By: Rolm Baptise M.D.   On: 05/21/2016 07:44   Dg Chest Port 1 View  Result Date: 05/19/2016 CLINICAL DATA:  Recurrent pneumothorax EXAM: PORTABLE CHEST 1 VIEW COMPARISON:  02/08/ 2018, 05/19/2016 FINDINGS: Left-sided central venous port tip overlies the SVC. The right lung is grossly clear. A left-sided chest tube has been slightly retracted. Decreased size of previously noted left pneumothorax. Tiny apical residual is suspected. Increased atelectasis at the left lung base. Stable cardiomediastinal silhouette. Possible tiny right effusion. IMPRESSION: 1. Slight retraction of left lower chest tube. Decreased left-sided pneumothorax with tiny apical residual wall visualized. 2. Increased basilar atelectasis on the left. 3. Possible tiny right effusion Electronically Signed   By: Donavan Foil M.D.   On: 05/19/2016 22:55    Anti-infectives: Anti-infectives    None      Assessment/Plan: Impression: Left pneumothorax resolved with chest tube. Chest x-ray looks good today. Patient has been started on tube feeds. We'll reassess chest tube in  a.m. while on water-seal. I will adjusted in a.m. I have put it back to -20 cm on the Pleur-evac.  LOS: 1 day    Tiffany Hale A 05/21/2016

## 2016-05-22 ENCOUNTER — Inpatient Hospital Stay (HOSPITAL_COMMUNITY): Payer: Medicaid Other

## 2016-05-22 ENCOUNTER — Ambulatory Visit: Payer: Medicaid Other | Admitting: Radiation Oncology

## 2016-05-22 LAB — BASIC METABOLIC PANEL
Anion gap: 6 (ref 5–15)
BUN: 7 mg/dL (ref 6–20)
CALCIUM: 8.8 mg/dL — AB (ref 8.9–10.3)
CO2: 31 mmol/L (ref 22–32)
CREATININE: 0.4 mg/dL — AB (ref 0.44–1.00)
Chloride: 99 mmol/L — ABNORMAL LOW (ref 101–111)
GFR calc non Af Amer: >60 mL/min (ref >60)
Glucose, Bld: 103 mg/dL — ABNORMAL HIGH (ref 65–99)
Potassium: 3.7 mmol/L (ref 3.5–5.1)
SODIUM: 136 mmol/L (ref 135–145)

## 2016-05-22 LAB — CBC
HCT: 25.9 % — ABNORMAL LOW (ref 36.0–46.0)
Hemoglobin: 8.5 g/dL — ABNORMAL LOW (ref 12.0–15.0)
MCH: 26.4 pg (ref 26.0–34.0)
MCHC: 32.8 g/dL (ref 30.0–36.0)
MCV: 80.4 fL (ref 78.0–100.0)
PLATELETS: 123 10*3/uL — AB (ref 150–400)
RBC: 3.22 MIL/uL — AB (ref 3.87–5.11)
RDW: 15.3 % (ref 11.5–15.5)
WBC: 3.2 10*3/uL — AB (ref 4.0–10.5)

## 2016-05-22 NOTE — Progress Notes (Signed)
  Subjective: While on waterseal, repeat chest x-ray showed recurrent pneumothorax. Patient was asymptomatic.  Objective: Vital signs in last 24 hours: Temp:  [98.7 F (37.1 C)-100.6 F (38.1 C)] 98.7 F (37.1 C) (02/12 0436) Pulse Rate:  [75-93] 75 (02/12 0436) Resp:  [16] 16 (02/12 0436) BP: (119-141)/(61-66) 119/62 (02/12 0436) SpO2:  [91 %-94 %] 93 % (02/12 0436) Last BM Date: 05/20/16  Intake/Output from previous day: 02/11 0701 - 02/12 0700 In: 1467.5 [P.O.:480; I.V.:927.5; NG/GT:60] Out: 2100 [Urine:2100] Intake/Output this shift: No intake/output data recorded.  General appearance: alert, cooperative, cachectic and no distress Resp: clear to auscultation bilaterally and Chest tube without air leak, back on -20 cm suction  Lab Results:   Recent Labs  05/20/16 0552 05/22/16 0504  WBC 4.4 3.2*  HGB 8.2* 8.5*  HCT 26.5* 25.9*  PLT 129* 123*   BMET  Recent Labs  05/20/16 0552 05/22/16 0504  NA 136 136  K 4.0 3.7  CL 96* 99*  CO2 35* 31  GLUCOSE 108* 103*  BUN 10 7  CREATININE 0.45 0.40*  CALCIUM 8.7* 8.8*   PT/INR No results for input(s): LABPROT, INR in the last 72 hours.  Studies/Results: Dg Chest Port 1 View  Result Date: 05/22/2016 CLINICAL DATA:  Pneumothorax, followup EXAM: PORTABLE CHEST 1 VIEW COMPARISON:  Portable exam 0904 hours compared to 05/21/2016 FINDINGS: Small bore LEFT thoracostomy tube unchanged. Normal heart size, mediastinal contours, pulmonary vascularity. Atherosclerotic calcification aorta. Moderate LEFT pneumothorax greatest at base but increased at apex as well. Associated LEFT basilar atelectasis. No mediastinal shift. RIGHT lung clear. No pleural effusion or acute osseous findings. IMPRESSION: Moderate LEFT pneumothorax despite thoracostomy tube most significantly increased at LEFT lung base. No mediastinal shift. Associated LEFT basilar atelectasis. Findings called to Dr. Arnoldo Morale on 05/22/2016 at 0927 hours. Electronically  Signed   By: Lavonia Dana M.D.   On: 05/22/2016 09:27   Dg Chest Port 1 View  Result Date: 05/21/2016 CLINICAL DATA:  Left pneumothorax EXAM: PORTABLE CHEST 1 VIEW COMPARISON:  05/19/2016 FINDINGS: Small left apical pneumothorax again noted, unchanged. Small bore left chest tube is in stable position. There is hyperinflation of the lungs compatible with COPD. Left Port-A-Cath is unchanged. Heart is normal size. No confluent opacities. IMPRESSION: COPD. Stable tiny left apical pneumothorax with small bore left chest tube in place. Electronically Signed   By: Rolm Baptise M.D.   On: 05/21/2016 07:44    Anti-infectives: Anti-infectives    None      Assessment/Plan: Impression: Patient failed water-sealed. Suction is back on the chest tube. No air leak present. We will try again tomorrow.  LOS: 2 days    Egbert Seidel A 05/22/2016

## 2016-05-22 NOTE — Care Management Note (Addendum)
Case Management Note  Patient Details  Name: Tiffany Hale MRN: [id number] Date of Birth: [birth year]  Subjective/Objective:                  Pt is from home, lives with friend. Pt has sister and mother who are supportive. Pt scheduled to begin chemo and radiation tx's soon. Pt's sister who will be providing most of her care concerned with her ability to care for pt. Pt currently able to perform ADL's ind. She has PEG tube, she uses Boost that her son provides. She has PCP and is followed at the [facility] cancer center , her sister is her transportation. Her medicaid pays for her medications. Her sister wants for pt to be placed while undergoing her cancer treatments. Medicaid placement discussed with pt and sister. Pt is not interested in placement at this point. Pt will cont to discuss with family. Pt plans to return home. Will order [place] and SW. Pt has no preference over [facility] agency. [facility] has availability and has been given referral. Romualdo Bolk, of [facility], will obtain pt info from chart.   Action/Plan: CM will cont to follow for DC planning.   Expected Discharge Date:  05/21/16               Expected Discharge Plan:  [place]  In-House Referral:    N/A  Discharge planning Services  CM Consult  Post Acute Care Choice:  Home Health Choice offered to:  Patient  DME Arranged:    DME Agency:     HH Arranged:    [place] Agency:    RN, social work  Status of Service:  In process, will continue to follow  If discussed at Long Length of Stay Meetings, dates discussed:    Additional Comments: Per Romualdo Bolk, of [facility], pt has been active with them for the past week. Pt will need order to resume services at DC.  Sherald Barge, RN 05/22/2016, 1:46 PM

## 2016-05-22 NOTE — Progress Notes (Signed)
PROGRESS NOTE    Tiffany Hale  [id number] DOB: Aug 26, 1955 DOA: 05/19/2016 PCP: [name], NP    Brief Narrative:  61 year old female with a history of squamous cell cancer of hypopharynx, recently had Port-A-Cath and PEG tube placed on 05/17/16. She had a small pneumothorax developed after Port-A-Cath placement. She underwent chest tube placement by surgery with improvement of pneumothorax. The patient was discharged home, but became increasingly drowsy. She came back to the emergency room where imaging indicated recurrent left pneumothorax. Chest tube was placed to suction with resolution of pneumothorax. General surgery following for chest tube management.   Assessment & Plan:   Active Problems:   Squamous cell cancer of hypopharynx (HCC)   Cancer of epiglottis (suprahyoid portion) (HCC)   Pneumothorax on left   Pneumothorax   Protein-calorie malnutrition, severe   1. Recurrent left pneumothorax. Status post chest tube. Follow-up imaging indicate improvement of pneumothorax. Patient was placed on waterseal today and developed recurrent pneumothorax. General surgery following and is managing chest tube.  2. Squamous cell cancer of hypopharynx. Followed by oncology.  3. Acute encephalopathy. Patient was very lethargic prior to admission. Possibly related to polypharmacy and multiple sedating medications. No signs of infection. Mental status has since improved.  4. Severe malnutrition. Patient has PEG tube placed for nutrition. She is on tube feeding, .  5. Anemia. Likely related to chronic disease from underlying malignancy. Hemoglobin has been stable.  6. Chronic pain syndrome. Patient is on OxyContin and oxycodone. Resume home dose of OxyContin at reduced dose  7. Fever. Patient has low-grade fever of 100.6. Chest x-ray does not show any evidence of pneumonia. Urinalysis does not show any signs of infection. Continue to monitor.    DVT prophylaxis: Lovenox Code  Status: Full code Family Communication: No family present Disposition Plan: Discharge home once improved   Consultants:   Gen. surgery  Procedures:     Antimicrobials:       Subjective: Denies any shortness of breath. She is tearful and appears depressed.  Objective: Vitals:   05/21/16 1500 05/21/16 2121 05/22/16 0436 05/22/16 1501  BP: 131/66 (!) 141/61 119/62 (!) 150/65  Pulse: 93 86 75 77  Resp: 16 16 16 20   Temp: (!) 100.6 F (38.1 C) 99.2 F (37.3 C) 98.7 F (37.1 C) 98.2 F (36.8 C)  TempSrc: Oral Oral Oral Oral  SpO2: 94% 91% 93% 97%  Weight:      Height:        Intake/Output Summary (Last 24 hours) at 05/22/16 1833 Last data filed at 05/22/16 1800  Gross per 24 hour  Intake           1807.5 ml  Output             2300 ml  Net           -492.5 ml   Filed Weights   05/19/16 1855 05/20/16 0003  Weight: 45.4 kg (100 lb) 49.3 kg (108 lb 11 oz)    Examination:  General exam: Appears calm and comfortable, cachetic  Respiratory system: Clear to auscultation. Respiratory effort normal. Left chest tube Cardiovascular system: S1 & S2 heard, RRR. No JVD, murmurs, rubs, gallops or clicks. No pedal edema. Gastrointestinal system: Abdomen is nondistended, soft and nontender. No organomegaly or masses felt. Normal bowel sounds heard. PEG tube in place Central nervous system: Alert and oriented. No focal neurological deficits. Extremities: Symmetric 5 x 5 power. Skin: No rashes, lesions or ulcers Psychiatry: Judgement and insight  appear normal. Mood & affect appropriate.     Data Reviewed: I have personally reviewed following labs and imaging studies  CBC:  Recent Labs Lab 05/19/16 2005 05/20/16 0552 05/22/16 0504  WBC 6.5 4.4 3.2*  NEUTROABS 4.9  --   --   HGB 8.2* 8.2* 8.5*  HCT 26.6* 26.5* 25.9*  MCV 82.4 82.6 80.4  PLT 154 129* [id number]*   Basic Metabolic Panel:  Recent Labs Lab 05/19/16 2005 05/20/16 0552 05/22/16 0504  NA 133* 136 136  K  4.1 4.0 3.7  CL 94* 96* 99*  CO2 34* 35* 31  GLUCOSE 99 108* 103*  BUN 12 10 7   CREATININE 0.56 0.45 0.40*  CALCIUM 8.5* 8.7* 8.8*   GFR: Estimated Creatinine Clearance: 56 mL/min (by C-G formula based on SCr of 0.4 mg/dL (L)). Liver Function Tests:  Recent Labs Lab 05/19/16 2005 05/20/16 0552  AST 23 20  ALT 17 15  ALKPHOS 43 41  BILITOT 0.4 0.4  PROT 6.7 6.3*  ALBUMIN 2.7* 2.5*   No results for input(s): LIPASE, AMYLASE in the last 168 hours. No results for input(s): AMMONIA in the last 168 hours. Coagulation Profile: No results for input(s): INR, PROTIME in the last 168 hours. Cardiac Enzymes: No results for input(s): CKTOTAL, CKMB, CKMBINDEX, TROPONINI in the last 168 hours. BNP (last 3 results) No results for input(s): PROBNP in the last 8760 hours. HbA1C: No results for input(s): HGBA1C in the last 72 hours. CBG: No results for input(s): GLUCAP in the last 168 hours. Lipid Profile: No results for input(s): CHOL, HDL, LDLCALC, TRIG, CHOLHDL, LDLDIRECT in the last 72 hours. Thyroid Function Tests: No results for input(s): TSH, T4TOTAL, FREET4, T3FREE, THYROIDAB in the last 72 hours. Anemia Panel: No results for input(s): VITAMINB12, FOLATE, FERRITIN, TIBC, IRON, RETICCTPCT in the last 72 hours. Sepsis Labs: No results for input(s): PROCALCITON, LATICACIDVEN in the last 168 hours.  Recent Results (from the past 240 hour(s))  Surgical pcr screen     Status: Abnormal   Collection Time: 05/15/16  1:02 PM  Result Value Ref Range Status   MRSA, PCR POSITIVE (A) NEGATIVE Final    Comment: RESULT CALLED TO, READ BACK BY AND VERIFIED WITH: LEFT MESSAGE SHORT STAY AT 1600 BY THOMPSON S. RESULT CALLED TO, READ BACK BY AND VERIFIED WITH: LEFT MESSAGE SHORT STAY AT 1600 ON [id number] BY THOMPSON S.    Staphylococcus aureus POSITIVE (A) NEGATIVE Final    Comment:        The Xpert SA Assay (FDA approved for NASAL specimens in patients over 59 years of age), is one  component of a comprehensive surveillance program.  Test performance has been validated by [facility] for patients greater than or equal to 56 year old. It is not intended to diagnose infection nor to guide or monitor treatment. RESULT CALLED TO, READ BACK BY AND VERIFIED WITH: LEFT MESSAGE SHORT STAY AT 1600 ON [id number] BY THOMPSON S.          Radiology Studies: Dg Chest 1v Repeat Same Day  Result Date: 05/22/2016 CLINICAL DATA:  Left pneumothorax. EXAM: CHEST - 1 VIEW SAME DAY 4:29 p.m. COMPARISON:  05/22/2016 at 2:06 p.m. FINDINGS: Small left chest tube is been inserted in the left pneumothorax has almost completely resolved with a small residual apical component remaining. There is slight atelectasis at the left lung base. Right lung is clear. Power port in place. Heart size and vascularity are normal. IMPRESSION: Almost complete relief of the left  pneumothorax of the tiny residual apical component. Electronically Signed   By: Lorriane Shire M.D.   On: 05/22/2016 16:51   Dg Chest Port 1 View  Result Date: 05/22/2016 CLINICAL DATA:  Pneumothorax, followup EXAM: PORTABLE CHEST 1 VIEW COMPARISON:  Portable exam 0904 hours compared to 05/21/2016 FINDINGS: Small bore LEFT thoracostomy tube unchanged. Normal heart size, mediastinal contours, pulmonary vascularity. Atherosclerotic calcification aorta. Moderate LEFT pneumothorax greatest at base but increased at apex as well. Associated LEFT basilar atelectasis. No mediastinal shift. RIGHT lung clear. No pleural effusion or acute osseous findings. IMPRESSION: Moderate LEFT pneumothorax despite thoracostomy tube most significantly increased at LEFT lung base. No mediastinal shift. Associated LEFT basilar atelectasis. Findings called to Dr. Arnoldo Morale on 05/22/2016 at 0927 hours. Electronically Signed   By: Lavonia Dana M.D.   On: 05/22/2016 09:27   Dg Chest Port 1 View  Result Date: 05/21/2016 CLINICAL DATA:  Left pneumothorax EXAM: PORTABLE CHEST  1 VIEW COMPARISON:  05/19/2016 FINDINGS: Small left apical pneumothorax again noted, unchanged. Small bore left chest tube is in stable position. There is hyperinflation of the lungs compatible with COPD. Left Port-A-Cath is unchanged. Heart is normal size. No confluent opacities. IMPRESSION: COPD. Stable tiny left apical pneumothorax with small bore left chest tube in place. Electronically Signed   By: Rolm Baptise M.D.   On: 05/21/2016 07:44   Dg Chest Port 1v Same Day  Result Date: 05/22/2016 CLINICAL DATA:  LEFT pneumothorax, followup EXAM: PORTABLE CHEST 1 VIEW COMPARISON:  Portable exam 1406 hours compared to 0904 hours FINDINGS: LEFT thoracostomy tube unchanged. LEFT pneumothorax is perhaps minimally larger at the upper LEFT hemithorax, unchanged inferiorly. Partial atelectasis of LEFT lung base. Central peribronchial thickening again seen. RIGHT lung clear. Stable heart size and mediastinal shift contours without mediastinal shift. IMPRESSION: Persistent LEFT pneumothorax, perhaps minimally larger at the upper LEFT hemithorax versus previous exam though the basilar portion appears unchanged. Partial atelectasis LEFT lung base. Findings discussed with Dr. Arnoldo Morale on 05/22/2016 at 1430 hours. Electronically Signed   By: Lavonia Dana M.D.   On: 05/22/2016 14:32        Scheduled Meds: . ARIPiprazole  15 mg Oral Daily  . Chlorhexidine Gluconate Cloth  6 each Topical Q0600  . enoxaparin (LOVENOX) injection  40 mg Subcutaneous Q24H  . escitalopram  30 mg Oral Daily  . feeding supplement  237 mL Oral TID WC  . feeding supplement (OSMOLITE 1.5 CAL)  360 mL Per Tube TID  . feeding supplement (PRO-STAT SUGAR FREE 64)  30 mL Per Tube Daily  . lubiprostone  24 mcg Oral BID WC  . mupirocin ointment  1 application Nasal BID  . oxybutynin  5 mg Oral TID  . oxyCODONE  30 mg Oral Q12H  . zolpidem  5 mg Oral QHS   Continuous Infusions: . sodium chloride 75 mL/hr at 05/22/16 0529     LOS: 2 days     Time spent: 62mins    MEMON,JEHANZEB, MD [facility] Pager [phone]  If 7PM-7AM, please contact night-coverage [email or web address] Password [facility] 05/22/2016, 6:33 PM

## 2016-05-22 NOTE — Progress Notes (Signed)
Patient failed waterseal.    Chest tube placed back on suction. Will recheck chest x-ray in am.

## 2016-05-22 NOTE — Progress Notes (Signed)
Pt is aware.  

## 2016-05-22 NOTE — Progress Notes (Signed)
Suction to chest tube from wall unit increased to high as ordered. Also waterseal increased to 40. Pt. Sitting up in chair. Tolerated well.

## 2016-05-23 ENCOUNTER — Telehealth: Payer: Self-pay | Admitting: *Deleted

## 2016-05-23 ENCOUNTER — Ambulatory Visit (HOSPITAL_COMMUNITY): Payer: Medicaid Other | Admitting: Speech Pathology

## 2016-05-23 ENCOUNTER — Inpatient Hospital Stay (HOSPITAL_COMMUNITY): Payer: Medicaid Other

## 2016-05-23 ENCOUNTER — Encounter: Payer: Medicaid Other | Admitting: Nutrition

## 2016-05-23 ENCOUNTER — Ambulatory Visit (HOSPITAL_COMMUNITY): Payer: Medicaid Other

## 2016-05-23 ENCOUNTER — Encounter (HOSPITAL_COMMUNITY): Payer: Self-pay | Admitting: Emergency Medicine

## 2016-05-23 ENCOUNTER — Ambulatory Visit: Payer: Medicaid Other

## 2016-05-23 ENCOUNTER — Ambulatory Visit: Payer: Medicaid Other | Admitting: Physical Therapy

## 2016-05-23 MED ORDER — OSMOLITE 1.5 CAL PO LIQD
360.0000 mL | Freq: Three times a day (TID) | ORAL | Status: DC
  Administered 2016-05-23: 360 mL
  Filled 2016-05-23 (×4): qty 1000

## 2016-05-23 MED ORDER — OSMOLITE 1.5 CAL PO LIQD
240.0000 mL | Freq: Three times a day (TID) | ORAL | Status: DC
  Administered 2016-05-23 – 2016-05-25 (×3): 240 mL
  Filled 2016-05-23 (×9): qty 1000

## 2016-05-23 NOTE — Progress Notes (Signed)
Subjective: No complaints.  Objective: Vital signs in last 24 hours: Temp:  [98.2 F (36.8 C)-98.4 F (36.9 C)] 98.3 F (36.8 C) (02/13 0453) Pulse Rate:  [75-77] 75 (02/13 0453) Resp:  [18-20] 18 (02/13 0453) BP: (134-150)/(61-65) 134/63 (02/13 0453) SpO2:  [93 %-97 %] 94 % (02/13 0453) Last BM Date: 05/22/16  Intake/Output from previous day: 02/12 0701 - 02/13 0700 In: 840 [P.O.:840] Out: 2100 [Urine:2100] Intake/Output this shift: No intake/output data recorded.  General appearance: alert, cooperative and no distress Resp: clear to auscultation bilaterally and No air leak and chest tube  Lab Results:   Recent Labs  05/22/16 0504  WBC 3.2*  HGB 8.5*  HCT 25.9*  PLT 123*   BMET  Recent Labs  05/22/16 0504  NA 136  K 3.7  CL 99*  CO2 31  GLUCOSE 103*  BUN 7  CREATININE 0.40*  CALCIUM 8.8*   PT/INR No results for input(s): LABPROT, INR in the last 72 hours.  Studies/Results: Dg Chest 1v Repeat Same Day  Result Date: 05/22/2016 CLINICAL DATA:  Left pneumothorax. EXAM: CHEST - 1 VIEW SAME DAY 4:29 p.m. COMPARISON:  05/22/2016 at 2:06 p.m. FINDINGS: Small left chest tube is been inserted in the left pneumothorax has almost completely resolved with a small residual apical component remaining. There is slight atelectasis at the left lung base. Right lung is clear. Power port in place. Heart size and vascularity are normal. IMPRESSION: Almost complete relief of the left pneumothorax of the tiny residual apical component. Electronically Signed   By: Lorriane Shire M.D.   On: 05/22/2016 16:51   Dg Chest Port 1 View  Result Date: 05/23/2016 CLINICAL DATA:  Left pneumothorax. EXAM: PORTABLE CHEST 1 VIEW COMPARISON:  Radiograph of May 22, 2016. FINDINGS: The heart size and mediastinal contours are within normal limits. Right lung is clear. Minimal left apical pneumothorax is noted which is slightly decreased in size compared to prior exam. Left-sided chest tube  is again noted. Stable left basilar opacity is noted consistent with pneumonia or atelectasis and associated minimal pleural effusion. The visualized skeletal structures are unremarkable. IMPRESSION: Minimal left apical pneumothorax which is slightly decreased compared to prior exam. Left-sided chest tube is again noted. Stable left basilar subsegmental atelectasis or pneumonia with minimal associated pleural effusion. Electronically Signed   By: Marijo Conception, M.D.   On: 05/23/2016 07:25   Dg Chest Port 1 View  Result Date: 05/22/2016 CLINICAL DATA:  Pneumothorax, followup EXAM: PORTABLE CHEST 1 VIEW COMPARISON:  Portable exam 0904 hours compared to 05/21/2016 FINDINGS: Small bore LEFT thoracostomy tube unchanged. Normal heart size, mediastinal contours, pulmonary vascularity. Atherosclerotic calcification aorta. Moderate LEFT pneumothorax greatest at base but increased at apex as well. Associated LEFT basilar atelectasis. No mediastinal shift. RIGHT lung clear. No pleural effusion or acute osseous findings. IMPRESSION: Moderate LEFT pneumothorax despite thoracostomy tube most significantly increased at LEFT lung base. No mediastinal shift. Associated LEFT basilar atelectasis. Findings called to Dr. Arnoldo Morale on 05/22/2016 at 0927 hours. Electronically Signed   By: Lavonia Dana M.D.   On: 05/22/2016 09:27   Dg Chest Port 1v Same Day  Result Date: 05/22/2016 CLINICAL DATA:  LEFT pneumothorax, followup EXAM: PORTABLE CHEST 1 VIEW COMPARISON:  Portable exam 1406 hours compared to 0904 hours FINDINGS: LEFT thoracostomy tube unchanged. LEFT pneumothorax is perhaps minimally larger at the upper LEFT hemithorax, unchanged inferiorly. Partial atelectasis of LEFT lung base. Central peribronchial thickening again seen. RIGHT lung clear. Stable heart size and mediastinal  shift contours without mediastinal shift. IMPRESSION: Persistent LEFT pneumothorax, perhaps minimally larger at the upper LEFT hemithorax versus  previous exam though the basilar portion appears unchanged. Partial atelectasis LEFT lung base. Findings discussed with Dr. Arnoldo Morale on 05/22/2016 at 1430 hours. Electronically Signed   By: Lavonia Dana M.D.   On: 05/22/2016 14:32    Anti-infectives: Anti-infectives    None      Assessment/Plan: Impression: Chest x-ray looks good today. We will continue suction for today. May try to go to water seal tomorrow in a.m.  LOS: 3 days    Jaycie Kregel A 05/23/2016

## 2016-05-23 NOTE — Progress Notes (Signed)
Nutrition Follow-up  DOCUMENTATION CODES:  Severe malnutrition in context of chronic illness   Pt meets criteria for SEVERE MALNUTRITION   INTERVENTION:  Pt reports only having  Small BM yesterday and still is constipated, on substantial pain meds. Per 2/8 Encounter, GI had left samples of movantic for pt to try as Baker Pierini was not working, unsure if she ever procured these.   Pts PO intake is substantial and she has complained of pain/fullness with TF regimen. Reduce TF to 240 cc Osmolite 1.5 TID via Peg. + Prostat 30 ml Daily. Flush with 30cc before and after feed. Provides 1180 kcals, 60 g Pro, 549 cc fluid.   Patient has not yet been evaluated by ST. She has trouble swallowing secondary to hypopharyngeal tumour. Recommend inpatient ST consult.  Continue Choc Boost High Protein TID each supplement provides 240 kcal and 15 grams of protein. (pt reports drinking 2 per day)  Food preferences will be passed to dietary  NUTRITION DIAGNOSIS:  Increased nutrient needs related to cancer and cancer related treatments as evidenced by estimated nutritional requirements for the condition  GOAL:  Patient will meet greater than or equal to 90% of their needs  MONITOR:  PO intake, Supplement acceptance, Labs, TF tolerance, I & O's  ASSESSMENT:  61 y/o female PMHx Anxiety, depression Hep A, Hep C, Cirrhosis, Tobacco abuse, GERD and recent dx with Squamous cell carcinoma of hypopharynx. Underwent Cath/Peg placement on 05/17/16 with compliaction of pneumothorax. Had Chest tube placed and went home. Represented with confusion   Interval history since 2/10: Per MD note 2/11 Pt voiced reluctance to receive tube feedings due to "causing pain". Feels full when TF is administered  Pt eating ~50% meals.   Pt seen sitting up with other family members/friends present. She says that when the TF was initially started, the tf infusions were "painful". She reports this being due to the TF being pushed instead of  letting gravity drip. Since letting infuse with gravity, much more tolerable. Denies any nausea since that time.   Though meal intake documented as ~50%, the patient says she is eating much less than this. She has trouble chewing. Did not mention any dysphagia. She will eat the jello off the trays, but not much more. She says the hospital food is not good and she does not like almost any of it. RD was able to find some foods she would like on her trays: Jello, pudding, applesauce, banana, cold cereal.   She is drinking fluids well. She has been drinking 2 Boost Original Choc supplements/day as well as a glass of juice.  At this time, pt's over-sense of fullness may be due to receiving a TF regimen that meets 100% of needs and significant PO intake. Will reduce TF amount.   She has significant constipation. She had a small BM yesterday, but it was hard. She feels like she still is very constipated. She says what she is receiving now "isnt working"-is on amitiza now.   Per 2/8 Encounter, She had GI appointment w/ PA regarding her cirrhosis and  was supposed to be placed on movantic due to constipation. Samples were supposed to be left, unsure if pt picked up  Upon exiting room, pt's sister stopped RD and voiced that "pt is hiding food in her room and not drinking the Boost supplements". She claims pt is anorexic and is "not eating or drinking anything".    Physical Exam: severe fat/muscle wasting.   Using her dosing weight of 103  lbs. She has lost 16 lbs since August, a loss of 13% bw in 6 months.   Medications: Boost high protein, osmolite. Oxycodone, amitiza  Labs: Albumin: 2.5   Recent Labs Lab 05/19/16 2005 05/20/16 0552 05/22/16 0504  NA 133* 136 136  K 4.1 4.0 3.7  CL 94* 96* 99*  CO2 34* 35* 31  BUN 12 10 7   CREATININE 0.56 0.45 0.40*  CALCIUM 8.5* 8.7* 8.8*  GLUCOSE 99 108* 103*   Diet Order:  Diet regular Room service appropriate? Yes; Fluid consistency: Thin  Skin:  Chest/throat surgical incision  Last BM:  2/12  Height:  Ht Readings from Last 1 Encounters:  05/19/16 5\' 4"  (1.626 m)   Weight:  Wt Readings from Last 1 Encounters:  05/20/16 108 lb 11 oz (49.3 kg)   Wt Readings from Last 10 Encounters:  05/20/16 108 lb 11 oz (49.3 kg)  05/15/16 103 lb (46.7 kg)  05/11/16 105 lb 12.8 oz (48 kg)  05/10/16 108 lb 11 oz (49.3 kg)  05/09/16 106 lb (48.1 kg)  05/08/16 107 lb 3.2 oz (48.6 kg)  05/02/16 106 lb 8 oz (48.3 kg)  03/01/16 107 lb (48.5 kg)  11/16/15 119 lb (54 kg)  11/12/15 119 lb (54 kg)   Ideal Body Weight:  54.54 kg  BMI:  Body mass index is 18.66 kg/m.  Dosing weight; 103 lbs (46.82 kg)  Estimated Nutritional Needs:  Kcal:  1650-1850 kcals (35-40 kcal/kg bw) Protein:  70-84 g Pro (1.5-1.8 g/kg bw) Fluid:  1.7-1.9 L fluid (103ml/kcal)  EDUCATION NEEDS:  No education needs identified at this time  Burtis Junes RD, LDN, CNSC Clinical Nutrition Pager: [phone] 05/23/2016 1:10 PM

## 2016-05-23 NOTE — Progress Notes (Signed)
Per Kirby Crigler PA we will hold chemo for 1 week since hospitalization.  I have pt rescheduled for 05/31/16.

## 2016-05-23 NOTE — Progress Notes (Signed)
PROGRESS NOTE    Tiffany Hale  [id number] DOB: 05-21-1955 DOA: 05/19/2016 PCP: [name], NP    Brief Narrative:  61 year old female with a history of squamous cell cancer of hypopharynx, recently had Port-A-Cath and PEG tube placed on 05/17/16. She had a small pneumothorax developed after Port-A-Cath placement. She underwent chest tube placement by surgery with improvement of pneumothorax. The patient was discharged home, but became increasingly drowsy. She came back to the emergency room where imaging indicated recurrent left pneumothorax. Chest tube was placed to suction with resolution of pneumothorax. General surgery following for chest tube management.   Assessment & Plan:   Active Problems:   Squamous cell cancer of hypopharynx (HCC)   Cancer of epiglottis (suprahyoid portion) (HCC)   Pneumothorax on left   Pneumothorax   Protein-calorie malnutrition, severe   1. Recurrent left pneumothorax. Status post chest tube. Follow-up imaging indicate improvement of pneumothorax. Possibly place him on water seal a.m. General surgery following and is managing chest tube.  2. Squamous cell cancer of hypopharynx. Followed by oncology.  3. Acute encephalopathy. Patient was very lethargic prior to admission. Possibly related to polypharmacy and multiple sedating medications. No signs of infection. Mental status has since improved.  4. Severe malnutrition. Patient has PEG tube placed for nutrition. She is on tube feeding, .  5. Anemia. Likely related to chronic disease from underlying malignancy. Hemoglobin has been stable.  6. Chronic pain syndrome. Patient is on OxyContin and oxycodone. Resume home dose of OxyContin at reduced dose  7. Fever. Patient has low-grade fever of 100.6. Chest x-ray does not show any evidence of pneumonia. Urinalysis does not show any signs of infection. Continue to monitor.    DVT prophylaxis: Lovenox Code Status: Full code Family  Communication: No family present Disposition Plan: Discharge home once improved   Consultants:   Gen. surgery  Procedures:     Antimicrobials:       Subjective: No shortness of breath. Minimal cough.  Objective: Vitals:   05/22/16 2000 05/22/16 2114 05/23/16 0453 05/23/16 1529  BP:  135/61 134/63 133/67  Pulse:  76 75 81  Resp:  18 18 18   Temp:  98.4 F (36.9 C) 98.3 F (36.8 C) 98.9 F (37.2 C)  TempSrc:  Oral Oral Oral  SpO2: 93% 95% 94% 99%  Weight:      Height:        Intake/Output Summary (Last 24 hours) at 05/23/16 1728 Last data filed at 05/23/16 1300  Gross per 24 hour  Intake              600 ml  Output             1750 ml  Net            -1150 ml   Filed Weights   05/19/16 1855 05/20/16 0003  Weight: 45.4 kg (100 lb) 49.3 kg (108 lb 11 oz)    Examination:  General exam: Appears calm and comfortable, cachetic  Respiratory system: Clear to auscultation. Respiratory effort normal. Left chest tube Cardiovascular system: S1 & S2 heard, RRR. No JVD, murmurs, rubs, gallops or clicks. No pedal edema. Gastrointestinal system: Abdomen is nondistended, soft and nontender. No organomegaly or masses felt. Normal bowel sounds heard. PEG tube in place Central nervous system: Alert and oriented. No focal neurological deficits. Extremities: Symmetric 5 x 5 power. Skin: No rashes, lesions or ulcers Psychiatry: Judgement and insight appear normal. Mood & affect appropriate.  Data Reviewed: I have personally reviewed following labs and imaging studies  CBC:  Recent Labs Lab 05/19/16 2005 05/20/16 0552 05/22/16 0504  WBC 6.5 4.4 3.2*  NEUTROABS 4.9  --   --   HGB 8.2* 8.2* 8.5*  HCT 26.6* 26.5* 25.9*  MCV 82.4 82.6 80.4  PLT 154 129* [id number]*   Basic Metabolic Panel:  Recent Labs Lab 05/19/16 2005 05/20/16 0552 05/22/16 0504  NA 133* 136 136  K 4.1 4.0 3.7  CL 94* 96* 99*  CO2 34* 35* 31  GLUCOSE 99 108* 103*  BUN 12 10 7   CREATININE 0.56  0.45 0.40*  CALCIUM 8.5* 8.7* 8.8*   GFR: Estimated Creatinine Clearance: 56 mL/min (by C-G formula based on SCr of 0.4 mg/dL (L)). Liver Function Tests:  Recent Labs Lab 05/19/16 2005 05/20/16 0552  AST 23 20  ALT 17 15  ALKPHOS 43 41  BILITOT 0.4 0.4  PROT 6.7 6.3*  ALBUMIN 2.7* 2.5*   No results for input(s): LIPASE, AMYLASE in the last 168 hours. No results for input(s): AMMONIA in the last 168 hours. Coagulation Profile: No results for input(s): INR, PROTIME in the last 168 hours. Cardiac Enzymes: No results for input(s): CKTOTAL, CKMB, CKMBINDEX, TROPONINI in the last 168 hours. BNP (last 3 results) No results for input(s): PROBNP in the last 8760 hours. HbA1C: No results for input(s): HGBA1C in the last 72 hours. CBG: No results for input(s): GLUCAP in the last 168 hours. Lipid Profile: No results for input(s): CHOL, HDL, LDLCALC, TRIG, CHOLHDL, LDLDIRECT in the last 72 hours. Thyroid Function Tests: No results for input(s): TSH, T4TOTAL, FREET4, T3FREE, THYROIDAB in the last 72 hours. Anemia Panel: No results for input(s): VITAMINB12, FOLATE, FERRITIN, TIBC, IRON, RETICCTPCT in the last 72 hours. Sepsis Labs: No results for input(s): PROCALCITON, LATICACIDVEN in the last 168 hours.  Recent Results (from the past 240 hour(s))  Surgical pcr screen     Status: Abnormal   Collection Time: 05/15/16  1:02 PM  Result Value Ref Range Status   MRSA, PCR POSITIVE (A) NEGATIVE Final    Comment: RESULT CALLED TO, READ BACK BY AND VERIFIED WITH: LEFT MESSAGE SHORT STAY AT 1600 BY THOMPSON S. RESULT CALLED TO, READ BACK BY AND VERIFIED WITH: LEFT MESSAGE SHORT STAY AT 1600 ON [id number] BY THOMPSON S.    Staphylococcus aureus POSITIVE (A) NEGATIVE Final    Comment:        The Xpert SA Assay (FDA approved for NASAL specimens in patients over 40 years of age), is one component of a comprehensive surveillance program.  Test performance has been validated by [facility]  for patients greater than or equal to 65 year old. It is not intended to diagnose infection nor to guide or monitor treatment. RESULT CALLED TO, READ BACK BY AND VERIFIED WITH: LEFT MESSAGE SHORT STAY AT 1600 ON [id number] BY THOMPSON S.          Radiology Studies: Dg Chest 1v Repeat Same Day  Result Date: 05/22/2016 CLINICAL DATA:  Left pneumothorax. EXAM: CHEST - 1 VIEW SAME DAY 4:29 p.m. COMPARISON:  05/22/2016 at 2:06 p.m. FINDINGS: Small left chest tube is been inserted in the left pneumothorax has almost completely resolved with a small residual apical component remaining. There is slight atelectasis at the left lung base. Right lung is clear. Power port in place. Heart size and vascularity are normal. IMPRESSION: Almost complete relief of the left pneumothorax of the tiny residual apical component. Electronically Signed  By: Lorriane Shire M.D.   On: 05/22/2016 16:51   Dg Chest Port 1 View  Result Date: 05/23/2016 CLINICAL DATA:  Left pneumothorax. EXAM: PORTABLE CHEST 1 VIEW COMPARISON:  Radiograph of May 22, 2016. FINDINGS: The heart size and mediastinal contours are within normal limits. Right lung is clear. Minimal left apical pneumothorax is noted which is slightly decreased in size compared to prior exam. Left-sided chest tube is again noted. Stable left basilar opacity is noted consistent with pneumonia or atelectasis and associated minimal pleural effusion. The visualized skeletal structures are unremarkable. IMPRESSION: Minimal left apical pneumothorax which is slightly decreased compared to prior exam. Left-sided chest tube is again noted. Stable left basilar subsegmental atelectasis or pneumonia with minimal associated pleural effusion. Electronically Signed   By: Marijo Conception, M.D.   On: 05/23/2016 07:25   Dg Chest Port 1 View  Result Date: 05/22/2016 CLINICAL DATA:  Pneumothorax, followup EXAM: PORTABLE CHEST 1 VIEW COMPARISON:  Portable exam 0904 hours compared to  05/21/2016 FINDINGS: Small bore LEFT thoracostomy tube unchanged. Normal heart size, mediastinal contours, pulmonary vascularity. Atherosclerotic calcification aorta. Moderate LEFT pneumothorax greatest at base but increased at apex as well. Associated LEFT basilar atelectasis. No mediastinal shift. RIGHT lung clear. No pleural effusion or acute osseous findings. IMPRESSION: Moderate LEFT pneumothorax despite thoracostomy tube most significantly increased at LEFT lung base. No mediastinal shift. Associated LEFT basilar atelectasis. Findings called to Dr. Arnoldo Morale on 05/22/2016 at 0927 hours. Electronically Signed   By: Lavonia Dana M.D.   On: 05/22/2016 09:27   Dg Chest Port 1v Same Day  Result Date: 05/22/2016 CLINICAL DATA:  LEFT pneumothorax, followup EXAM: PORTABLE CHEST 1 VIEW COMPARISON:  Portable exam 1406 hours compared to 0904 hours FINDINGS: LEFT thoracostomy tube unchanged. LEFT pneumothorax is perhaps minimally larger at the upper LEFT hemithorax, unchanged inferiorly. Partial atelectasis of LEFT lung base. Central peribronchial thickening again seen. RIGHT lung clear. Stable heart size and mediastinal shift contours without mediastinal shift. IMPRESSION: Persistent LEFT pneumothorax, perhaps minimally larger at the upper LEFT hemithorax versus previous exam though the basilar portion appears unchanged. Partial atelectasis LEFT lung base. Findings discussed with Dr. Arnoldo Morale on 05/22/2016 at 1430 hours. Electronically Signed   By: Lavonia Dana M.D.   On: 05/22/2016 14:32        Scheduled Meds: . ARIPiprazole  15 mg Oral Daily  . Chlorhexidine Gluconate Cloth  6 each Topical Q0600  . enoxaparin (LOVENOX) injection  40 mg Subcutaneous Q24H  . escitalopram  30 mg Oral Daily  . feeding supplement  237 mL Oral TID WC  . feeding supplement (OSMOLITE 1.5 CAL)  240 mL Per Tube TID  . feeding supplement (PRO-STAT SUGAR FREE 64)  30 mL Per Tube Daily  . lubiprostone  24 mcg Oral BID WC  . mupirocin  ointment  1 application Nasal BID  . oxybutynin  5 mg Oral TID  . oxyCODONE  30 mg Oral Q12H  . zolpidem  5 mg Oral QHS   Continuous Infusions: . sodium chloride 75 mL/hr at 05/22/16 0529     LOS: 3 days    Time spent: 12mins    Gracyn Santillanes, MD [facility] Pager [phone]  If 7PM-7AM, please contact night-coverage [email or web address] Password [facility] 05/23/2016, 5:28 PM

## 2016-05-23 NOTE — Telephone Encounter (Signed)
Oncology Nurse Navigator Documentation  Received IB from AP Navigator Shellia Carwin indicated patient remains hospitalized at [facility] for a pneumothorax, is unlikely to start chemo/RT tomorrow.  I notified Dr. Isidore Moos and Tomo RTTs.  [name], RN, BSN, [place] [facility] Nurse [place] [facility] [place] [phone]

## 2016-05-24 ENCOUNTER — Ambulatory Visit: Payer: Medicaid Other | Admitting: Radiation Oncology

## 2016-05-24 ENCOUNTER — Inpatient Hospital Stay (HOSPITAL_COMMUNITY): Payer: Medicaid Other

## 2016-05-24 ENCOUNTER — Ambulatory Visit: Payer: Medicaid Other | Attending: Radiation Oncology

## 2016-05-24 ENCOUNTER — Ambulatory Visit (HOSPITAL_COMMUNITY): Payer: Medicaid Other

## 2016-05-24 DIAGNOSIS — D638 Anemia in other chronic diseases classified elsewhere: Secondary | ICD-10-CM

## 2016-05-24 DIAGNOSIS — C321 Malignant neoplasm of supraglottis: Secondary | ICD-10-CM

## 2016-05-24 DIAGNOSIS — E43 Unspecified severe protein-calorie malnutrition: Secondary | ICD-10-CM

## 2016-05-24 DIAGNOSIS — D696 Thrombocytopenia, unspecified: Secondary | ICD-10-CM

## 2016-05-24 DIAGNOSIS — J9601 Acute respiratory failure with hypoxia: Secondary | ICD-10-CM

## 2016-05-24 DIAGNOSIS — J939 Pneumothorax, unspecified: Secondary | ICD-10-CM

## 2016-05-24 DIAGNOSIS — C139 Malignant neoplasm of hypopharynx, unspecified: Secondary | ICD-10-CM

## 2016-05-24 LAB — BASIC METABOLIC PANEL
Anion gap: 4 — ABNORMAL LOW (ref 5–15)
BUN: 5 mg/dL — AB (ref 6–20)
CO2: 32 mmol/L (ref 22–32)
CREATININE: 0.35 mg/dL — AB (ref 0.44–1.00)
Calcium: 8.4 mg/dL — ABNORMAL LOW (ref 8.9–10.3)
Chloride: 100 mmol/L — ABNORMAL LOW (ref 101–111)
GFR calc Af Amer: >60 mL/min (ref >60)
GFR calc non Af Amer: >60 mL/min (ref >60)
GLUCOSE: 94 mg/dL (ref 65–99)
POTASSIUM: 3.8 mmol/L (ref 3.5–5.1)
SODIUM: 136 mmol/L (ref 135–145)

## 2016-05-24 LAB — CBC
HCT: 23.8 % — ABNORMAL LOW (ref 36.0–46.0)
Hemoglobin: 7.4 g/dL — ABNORMAL LOW (ref 12.0–15.0)
MCH: 25.3 pg — AB (ref 26.0–34.0)
MCHC: 31.1 g/dL (ref 30.0–36.0)
MCV: 81.2 fL (ref 78.0–100.0)
PLATELETS: 84 10*3/uL — AB (ref 150–400)
RBC: 2.93 MIL/uL — ABNORMAL LOW (ref 3.87–5.11)
RDW: 15.4 % (ref 11.5–15.5)
WBC: 2.4 10*3/uL — ABNORMAL LOW (ref 4.0–10.5)

## 2016-05-24 NOTE — Progress Notes (Signed)
[facility] Hospital Day(s): 4.   Post op day(s):  Marland Kitchen   Interval History: Patient seen and examined, no acute events or new complaints overnight. Patient reports overall controlled Left chest and Left neck pain, denies chest tightness, SOB, N/V, or fever/chills. Patient states that she wishes to attempt increasing her oral nutrition today and says her tube feeds decrease her appetite.  Review of Systems:  Constitutional: denies fever, chills  HEENT: denies cough or congestion Respiratory: denies any shortness of breath  Cardiovascular: denies chest tightness or palpitations  Gastrointestinal: denies abdominal pain, N/V, or diarrhea Genitourinary: denies burning with urination or urinary frequency Musculoskeletal: denies pain except Left neck as per HPI, denies decreased motor or sensation Integumentary: denies any other rashes or skin discolorations Neurological: denies HA or vision/hearing changes   Vital signs in last 24 hours: [min-max] current  Temp:  [98.9 F (37.2 C)-99.9 F (37.7 C)] 99.9 F (37.7 C) (02/13 2300) Pulse Rate:  [75-81] 75 (02/13 2300) Resp:  [18] 18 (02/13 2300) BP: (133-138)/(66-67) 138/66 (02/13 2300) SpO2:  [99 %] 99 % (02/13 2300)     Height: 5\' 4"  (162.6 cm) Weight: 49.3 kg (108 lb 11 oz) BMI (Calculated): 17.2   Intake/Output this shift:  Total I/O In: 3810 [I.V.:3810] Out: -    Intake/Output last 2 shifts:  @IOLAST2SHIFTS @   Physical Exam:  Constitutional: alert, cooperative and no distress  HENT: normocephalic, Large and firm chronic Left neck mass Eyes: PERRL, EOM's grossly intact and symmetric  Neuro: CN II - XII grossly intact and symmetric without deficit  Respiratory: breathing non-labored at rest, Left chest drain well-secured without erythema or drainage Cardiovascular: regular rate and sinus rhythm  Gastrointestinal: soft, non-tender, and non-distended  Musculoskeletal: UE and LE FROM, no edema or wounds  Labs:  CBC:   Lab Results  Component Value Date   WBC 2.4 (L) 05/24/2016   RBC 2.93 (L) 05/24/2016   BMP:  Lab Results  Component Value Date   GLUCOSE 94 05/24/2016   CO2 32 05/24/2016   BUN 5 (L) 05/24/2016   CREATININE 0.35 (L) 05/24/2016   CREATININE 0.57 05/15/2016   CALCIUM 8.4 (L) 05/24/2016     Imaging studies:  Chest X-ray (05/24/2016) Small bore left chest tube is again seen and stable. No definitive pneumothorax is seen. Left chest wall port is noted. Right lung is clear. Mild left basilar atelectasis and small effusion is seen. No other focal abnormality is noted.  Assessment/Plan: (ICD-10's: J35.811) 61 y.o. female with recurrent post-procedural Left pneumothorax s/p insertion of tunneled Left central venous catheter for chemotherapy, complicated by pertinent comorbidities including Left hypopharynx squamous cell carcinoma; malnutrition (BMI 17.2); hepatitis A, B, and C with early cirrhosis; asthma; tobacco abuse; chronic back pain; osteoarthritis; GERD; generalized anxiety disorder; and major depression disorder.   - Left chest tube to water seal   - check repeat chest x-ray at 2 pm today   - if pneumothorax again recurs, transfer to [facility] for thoracic surgery eval, possible pleuradesis  - if no pneumothorax on pm chest x-ray, continue Left chest tube to water seal until tomorrow morning  - patient agrees to receive overnight gastric tube feeds with trial of increased PO nutrition today  - if unable to tolerate adequate PO nutrition today, patient agrees to resume tube feeds  All of the above findings and recommendations were discussed with the patient, Dr. Arnoldo Morale, and the medical team, and all of patient's questions were answered to her expressed satisfaction.  --  Marilynne Drivers. Rosana Hoes, MD, [place]: [facility] Office: [phone]

## 2016-05-24 NOTE — Progress Notes (Signed)
PROGRESS NOTE  Tiffany Hale [id number] DOB: Aug 15, 1955 DOA: 05/19/2016 PCP: [name], NP  Brief Narrative: 61 year old woman PMH hypopharyngeal squamous cell carcinoma, status post Port-A-Cath placement and PEG tube placement 2/7 with complication of pneumothorax. Status post chest tube placement and subsequent discharge home. Chest x-ray 2/8 revealed small left apical pneumothorax. Developed confusion and was seen in the emergency department 2/9 for confusion, found to be hypoxic requiring nonrebreather. Admitted for further management of left pneumothorax and acute hypoxic respiratory failure. 2+ placed on suction with resolution of pneumothorax, however once on waterseal, chest x-ray showed recurrent pneumothorax.  Assessment/Plan 1. Recurrent left pneumothorax status post chest tube placement.  Management per general surgery, repeat chest x-ray reassuring, shows resolution of pneumothorax. 2. Acute hypoxic respiratory failure secondary to left pneumothorax. Improved.  Wean oxygen. Doubt will need home oxygen. 3. Acute encephalopathy, thought to be multifactorial including hypoxia, narcotics, polypharmacy. Resolved. 4. Squamous cell carcinoma of the hypopharynx, likely begin chemotherapy and radiation therapy soon. 5. Anemia of chronic disease, hemoglobin appears to be stable. No evidence of bleeding. 6. Thrombocytopenia, acute on chronic. Significance unclear. Seen January 2009, June 2009, August 2010, March 2011, October 2011, June 2016.  Suspect chronic as above although was normal on admission. Doubt HIT. Stop Lovenox, follow clinically. 7. Chronic pain syndrome. Stable. 8. Severe malnutrition. PEG tube in place. Consider continuous feeding but currently eating well. 9. Underweight 10. Tobacco use disorder in remission   Overall appears stable. Repeat chest x-ray encouraging. Hopefully home 2/15.  DVT prophylaxis: SCDs (Lovenox d/c'd) Code Status: full  code Family Communication: none Disposition Plan: home   Murray Hodgkins, MD  [facility] Direct contact: [phone] --[address] [place]  --[email or web address]; password [email or web address]  7PM-7AM contact night coverage as above 05/24/2016, 1:32 PM  LOS: 4 days   Consultants:  General surgery  Procedures:  S/p chest tube placement, left, present on admission  Antimicrobials:    Interval history/Subjective: Feels better. Breathing fine. No complaints. Eating a lot by mouth today.  Objective: Vitals:   05/23/16 0453 05/23/16 1529 05/23/16 2300 05/24/16 1146  BP: 134/63 133/67 138/66 (!) 114/56  Pulse: 75 81 75 74  Resp: 18 18 18 16   Temp: 98.3 F (36.8 C) 98.9 F (37.2 C) 99.9 F (37.7 C) 99.1 F (37.3 C)  TempSrc: Oral Oral Oral Oral  SpO2: 94% 99% 99% 99%  Weight:      Height:        Intake/Output Summary (Last 24 hours) at 05/24/16 1332 Last data filed at 05/24/16 1100  Gross per 24 hour  Intake             4770 ml  Output              200 ml  Net             4570 ml     Filed Weights   05/19/16 1855 05/20/16 0003  Weight: 45.4 kg (100 lb) 49.3 kg (108 lb 11 oz)    Exam:    Constitutional:   Appears calm, comfortable, sitting in chair Respiratory:   Clear to auscultation bilaterally. No wheezes, rales or rhonchi. Normal respiratory effort. Cardiovascular:   Regular rate and rhythm. No murmur, rub or gallop. No lower extremity edema. Psychiatric:   Alert, speech fluent and clear. Grossly normal mood and affect. Speech fluent and appropriate.   I have personally reviewed following labs and imaging studies:  Platelets 84, seen 2016 as well. Down from 123  48 hrs. Ago.  Hemoglobin 7.4, slightly lower  BMP unremarkable  Scheduled Meds: . ARIPiprazole  15 mg Oral Daily  . escitalopram  30 mg Oral Daily  . feeding supplement  237 mL Oral TID WC  . feeding supplement (OSMOLITE 1.5 CAL)  240 mL Per Tube TID  . feeding supplement (PRO-STAT SUGAR FREE  64)  30 mL Per Tube Daily  . lubiprostone  24 mcg Oral BID WC  . mupirocin ointment  1 application Nasal BID  . oxybutynin  5 mg Oral TID  . oxyCODONE  30 mg Oral Q12H  . zolpidem  5 mg Oral QHS   Continuous Infusions: . sodium chloride 75 mL/hr at 05/22/16 [id number]    Principal Problem:   Pneumothorax on left Active Problems:   Squamous cell cancer of hypopharynx (HCC)   Cancer of epiglottis (suprahyoid portion) (HCC)   Protein-calorie malnutrition, severe   Acute respiratory failure with hypoxia (HCC)   Anemia of chronic disease   Thrombocytopenia (HCC)   LOS: 4 days

## 2016-05-24 NOTE — Plan of Care (Signed)
Problem: Nutrition: Goal: Adequate nutrition will be maintained Outcome: Progressing Patient taking more PO, continue to encourage PO intake.  Importance of nutrition stressed to patient - verbalizes understanding.

## 2016-05-24 NOTE — Progress Notes (Signed)
PEG feeding given this evening.  Patient only request for 2 ounces - states this is the most she has been able to tolerate thus far. Patient consuming most of Boost nutritional supplements through out the day

## 2016-05-24 NOTE — Progress Notes (Signed)
Patient refuses any feeding through PEG this AM.   States she wished to discontinue them altogether, feels that she is able to eat more by mouth now.  Boost given and Po intake encouraged

## 2016-05-25 ENCOUNTER — Ambulatory Visit: Payer: Medicaid Other | Admitting: Radiation Oncology

## 2016-05-25 ENCOUNTER — Inpatient Hospital Stay (HOSPITAL_COMMUNITY): Payer: Medicaid Other

## 2016-05-25 DIAGNOSIS — J95811 Postprocedural pneumothorax: Principal | ICD-10-CM

## 2016-05-25 LAB — CBC
HEMATOCRIT: 27.1 % — AB (ref 36.0–46.0)
Hemoglobin: 8.5 g/dL — ABNORMAL LOW (ref 12.0–15.0)
MCH: 25.2 pg — AB (ref 26.0–34.0)
MCHC: 31.4 g/dL (ref 30.0–36.0)
MCV: 80.4 fL (ref 78.0–100.0)
PLATELETS: 119 10*3/uL — AB (ref 150–400)
RBC: 3.37 MIL/uL — ABNORMAL LOW (ref 3.87–5.11)
RDW: 15.4 % (ref 11.5–15.5)
WBC: 4 10*3/uL (ref 4.0–10.5)

## 2016-05-25 MED ORDER — DICYCLOMINE HCL 10 MG PO CAPS: 10.0000 mg | ORAL_CAPSULE | Freq: Three times a day (TID) | ORAL | 0 refills | Status: AC

## 2016-05-25 MED ORDER — ESCITALOPRAM OXALATE 20 MG PO TABS: 30.0000 mg | ORAL_TABLET | Freq: Every day | ORAL | 0 refills | Status: AC

## 2016-05-25 MED ORDER — OXYBUTYNIN CHLORIDE 5 MG PO TABS: 5.0000 mg | ORAL_TABLET | Freq: Three times a day (TID) | ORAL | 0 refills | Status: AC

## 2016-05-25 NOTE — Care Management Note (Signed)
Case Management Note  Patient Details  Name: Tiffany Hale MRN: [id number] Date of Birth: [birth year]  Expected Discharge Date:  05/21/16               Expected Discharge Plan:  [place]  In-House Referral:  NA  Discharge planning Services  CM Consult  Post Acute Care Choice:  Home Health Choice offered to:  Patient  DME Arranged:    DME Agency:     HH Arranged:  RN, Social Work [employer] Agency:  [place]  Status of Service:  Completed, signed off  If discussed at [employer] of Stay Meetings, dates discussed:  05/25/2016  Additional Comments: Pt discharging home today with resumption of [facility] services. Pt is aware that [facility] has 48hrs to resume services. Romualdo Bolk, of [facility], aware of DC today and will obtain pt info from chart.  Sherald Barge, RN 05/25/2016, 9:38 AM

## 2016-05-25 NOTE — [place] FL2 (Signed)
[place] MEDICAID FL2 LEVEL OF CARE SCREENING TOOL     IDENTIFICATION  Patient Name: Tiffany Hale Birthdate: December 29, 1955 Sex: female Admission Date (Current Location): 05/19/2016  [facility] and [place] Number:  [employer] and Address:  [facility] [address], [place]      Provider Number: [id number]  Attending Physician Name and Address:  Samuella Cota, MD  Relative Name and Phone Number:       Current Level of Care: Hospital Recommended Level of Care: [place] Prior Approval Number:    Date Approved/Denied:   PASRR Number:    Discharge Plan: Other (Comment) (ALF )    Current Diagnoses: Patient Active Problem List   Diagnosis Date Noted  . Acute respiratory failure with hypoxia ([place]) 05/24/2016  . Anemia of chronic disease 05/24/2016  . Thrombocytopenia ([place]) 05/24/2016  . Protein-calorie malnutrition, severe 05/20/2016  . Pneumothorax on left 05/19/2016  . Loss of weight 05/11/2016  . Cancer of epiglottis (suprahyoid portion) ([place]) 05/10/2016  . 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 Dysphagia 11/09/2015  . IBS (irritable bowel syndrome) 05/12/2015  . Constipation 10/07/2014  . Gastritis 10/07/2014  . Bilateral foot pain 09/09/2014  . Colon adenomas 04/01/2014  . Postmenopausal atrophic vaginitis 07/18/2012  . Dyspareunia 07/18/2012  . 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) ([place]) 06/18/2012    Orientation RESPIRATION BLADDER Height & Weight     Self, Time, Situation, Place  Normal Continent Weight: 108 lb 11 oz (49.3 kg) Height:  5\' 4"  (162.6 cm)  BEHAVIORAL SYMPTOMS/MOOD NEUROLOGICAL BOWEL NUTRITION STATUS      Continent Diet (Regular)  AMBULATORY STATUS COMMUNICATION OF NEEDS Skin   Limited Assist Verbally Surgical wounds (05/17/16 Left Chest incision)                       Personal Care Assistance Level of Assistance  Bathing, Feeding, Dressing  Bathing Assistance: Limited assistance Feeding assistance: Independent Dressing Assistance: Limited assistance     Functional Limitations Info  Sight, Hearing, Speech Sight Info: Adequate Hearing Info: Adequate Speech Info: Adequate    SPECIAL CARE FACTORS FREQUENCY                       Contractures Contractures Info: Not present    Additional Factors Info  Code Status, Allergies, Psychotropic Code Status Info: Full Code Allergies Info: Aspirin, Codeine, Nsaids Psychotropic Info: Ablify, Lexapro, Xanax, Concerta, Desyrel         Current Medications (05/25/2016):  This is the current hospital active medication list Current Facility-Administered Medications  Medication Dose Route Frequency Provider Last Rate Last Dose  . 0.9 %  sodium chloride infusion   Intravenous Continuous Kathie Dike, MD 75 mL/hr at 05/24/16 2025    . acetaminophen (TYLENOL) tablet 650 mg  650 mg Oral Q6H PRN Oswald Hillock, MD       Or  . acetaminophen (TYLENOL) suppository 650 mg  650 mg Rectal Q6H PRN Oswald Hillock, MD      . ALPRAZolam Duanne Moron) tablet 0.5 mg  0.5 mg Oral TID PRN Oswald Hillock, MD   0.5 mg at 05/25/16 0843  . ARIPiprazole (ABILIFY) tablet 15 mg  15 mg Oral Daily Oswald Hillock, MD   15 mg at 05/21/16 1147  . dicyclomine (BENTYL) capsule 10 mg  10 mg Oral QID PRN Kathie Dike, MD   10 mg at 05/22/16  2132  . escitalopram (LEXAPRO) tablet 30 mg  30 mg Oral Daily Oswald Hillock, MD   30 mg at 05/25/16 0841  . feeding supplement (BOOST HIGH PROTEIN) liquid 237 mL  237 mL Oral TID WC Kathie Dike, MD   237 mL at 05/25/16 0800  . feeding supplement (OSMOLITE 1.5 CAL) liquid 240 mL  240 mL Per Tube TID Kathie Dike, MD   240 mL at 05/25/16 1000  . feeding supplement (PRO-STAT SUGAR FREE 64) liquid 30 mL  30 mL Per Tube Daily Kathie Dike, MD   30 mL at 05/25/16 0840  . lubiprostone (AMITIZA) capsule 24 mcg  24 mcg Oral BID WC Kathie Dike, MD   24 mcg at 05/25/16 0841  . oxybutynin  (DITROPAN) tablet 5 mg  5 mg Oral TID Oswald Hillock, MD   5 mg at 05/25/16 0842  . oxyCODONE (Oxy IR/ROXICODONE) immediate release tablet 5 mg  5 mg Oral Q4H PRN Oswald Hillock, MD   5 mg at 05/25/16 1054  . oxyCODONE (OXYCONTIN) 12 hr tablet 30 mg  30 mg Oral Q12H Kathie Dike, MD   30 mg at 05/25/16 0841  . zolpidem (AMBIEN) tablet 5 mg  5 mg Oral QHS Oswald Hillock, MD   5 mg at 05/24/16 2112     Discharge Medications: Please see discharge summary for a list of discharge medications.  Relevant Imaging Results:  Relevant Lab Results:   Additional Information SSN [id number] Clydene Pugh, LCSW
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
## 2016-05-25 NOTE — Progress Notes (Signed)
Called to room by patient, chest tube had come out while getting up to [facility]. Patient not experiencing shortness of breath, VSS. Occlusive dressing applied. MD paged and chest xray ordered.

## 2016-05-25 NOTE — Progress Notes (Signed)
PROGRESS NOTE  ELLEANOR ZYWICKI [id number] DOB: January 07, 1956 DOA: 05/19/2016 PCP: [name], NP  Brief Narrative: 61 year old woman PMH hypopharyngeal squamous cell carcinoma, status post Port-A-Cath placement and PEG tube placement 2/7 with complication of pneumothorax. Status post chest tube placement and subsequent discharge home. Chest x-ray 2/8 revealed small left apical pneumothorax. Developed confusion and was seen in the emergency department 2/9 for confusion, found to be hypoxic requiring nonrebreather. Admitted for further management of left pneumothorax and acute hypoxic respiratory failure. 2+ placed on suction with resolution of pneumothorax, however once on waterseal, chest x-ray showed recurrent pneumothorax.  Assessment/Plan 1. Recurrent left pneumothorax status post chest tube placement.  Chest tube is out. Repeat chest x-ray showed no evidence of pneumothorax. 2. Acute hypoxic respiratory failure secondary to left pneumothorax.   Resolved. 3. Acute encephalopathy, thought to be multifactorial including hypoxia, narcotics, polypharmacy. Resolved. 4. Squamous cell carcinoma of the hypopharynx, plan is to begin chemotherapy and radiation therapy next week. 5. Anemia of chronic disease, hemoglobin stable. No evidence of bleeding. 6. Thrombocytopenia, acute on chronic. Significance unclear. Seen January 2009, June 2009, August 2010, March 2011, October 2011, June 2016.  Platelet count improved today. No further evaluation suggested. 7. Chronic pain syndrome.  8. Severe malnutrition. PEG tube in place. Eating. 9. Underweight 10. Tobacco use disorder in remission   Doing well. Discharge home today.  Murray Hodgkins, MD  [facility] Direct contact: [phone] --Via amion app OR  --[email or web address]; password [email or web address]  7PM-7AM contact night coverage as above 05/25/2016, 2:22 PM  LOS: 5 days   Consultants:  General surgery  Procedures:  S/p chest tube  placement, left, present on admission  Antimicrobials:    Interval history/Subjective: Chest tube came out. Feeling better. Breathing fine. Cleared for discharge by surgery.  Objective: Vitals:   05/24/16 1444 05/24/16 2023 05/24/16 2120 05/25/16 0613  BP:   (!) 131/59 117/65  Pulse:   81 75  Resp:   18 18  Temp:   98.9 F (37.2 C) 99 F (37.2 C)  TempSrc:   Oral Oral  SpO2: 99% 95% 96% 95%  Weight:      Height:        Intake/Output Summary (Last 24 hours) at 05/25/16 1422 Last data filed at 05/25/16 1000  Gross per 24 hour  Intake           2237.5 ml  Output              300 ml  Net           1937.5 ml     Filed Weights   05/19/16 1855 05/20/16 0003  Weight: 45.4 kg (100 lb) 49.3 kg (108 lb 11 oz)    Exam:    Constitutional. Appears calm, comfortable.   Respiratory. Clear to auscultation bilaterally. No wheezes, rales or rhonchi. Normal respiratory effort.  Cardiovascular. Regular rate and rhythm. No murmur, rub or gallop.  Psychiatric. Alert. Speech fluent and clear.   I have personally reviewed following labs and imaging studies:  Platelets have increased, 119   Hemoglobin has increased, a 0.5  WBC now normal, 4.0  Scheduled Meds: . ARIPiprazole  15 mg Oral Daily  . escitalopram  30 mg Oral Daily  . feeding supplement  237 mL Oral TID WC  . feeding supplement (OSMOLITE 1.5 CAL)  240 mL Per Tube TID  . feeding supplement (PRO-STAT SUGAR FREE 64)  30 mL Per Tube Daily  . lubiprostone  24 mcg Oral BID  WC  . oxybutynin  5 mg Oral TID  . oxyCODONE  30 mg Oral Q12H  . zolpidem  5 mg Oral QHS   Continuous Infusions: . sodium chloride 75 mL/hr at 05/24/16 2025    Principal Problem:   Pneumothorax on left Active Problems:   Squamous cell cancer of hypopharynx (HCC)   Cancer of epiglottis (suprahyoid portion) (HCC)   Protein-calorie malnutrition, severe   Acute respiratory failure with hypoxia (HCC)   Anemia of chronic disease    Thrombocytopenia (HCC)   LOS: 5 days

## 2016-05-25 NOTE — Discharge Summary (Signed)
Physician Discharge Summary  Tiffany Hale [id number] DOB: 06-14-1955 DOA: 05/19/2016  PCP: [name], NP  Admit date: 05/19/2016 Discharge date: 05/25/2016  Recommendations for Outpatient Follow-up:   Follow-up chronic pain secondary to malignancy  Chemotherapy and radiation therapy are scheduled for next week  Follow-up Information    [facility].   Contact information: [address] [place] [address] [phone]          Discharge Diagnoses:  1. Recurrent pneumothorax post procedure 2. Acute hypoxic respiratory failure secondary to left pneumothorax 3. Acute encephalopathy, multifactorial 4. Squamous cell carcinoma of the hypopharynx 5. Anemia of chronic disease 6. Chronic thrombocytopenia 7. Chronic pain syndrome 8. Severe malnutrition 9. Underweight  Discharge Condition: Improved Disposition: Home  Diet recommendation: Regular  Filed Weights   05/19/16 1855 05/20/16 0003  Weight: 45.4 kg (100 lb) 49.3 kg (108 lb 11 oz)    History of present illness:  61 year old woman PMH hypopharyngeal squamous cell carcinoma, status post Port-A-Cath placement and PEG tube placement 2/7 with complication of pneumothorax. Status post chest tube placement and subsequent discharge home. Chest x-ray 2/8 revealed small left apical pneumothorax. Developed confusion and was seen in the emergency department 2/9 for confusion, found to be hypoxic requiring nonrebreather. Admitted for further management of left pneumothorax and acute hypoxic respiratory failure.   Hospital Course:  Acute hypoxic respiratory failure resolved with resolution of pneumothorax. Pneumothorax was managed by general surgeon. 2+ placed on suction with resolution of pneumothorax, however once on waterseal, chest x-ray showed recurrent pneumothorax. However patient gradually improved and chest tube accidentally removed 2/15. X-ray 2/15 no evidence of  pneumothorax. Clear for discharge by general surgeon.  1. Recurrent left pneumothorax status post chest tube placement.  Chest tube is out. Repeat chest x-ray showed no evidence of pneumothorax. 2. Acute hypoxic respiratory failure secondary to left pneumothorax.   Resolved. 3. Acute encephalopathy, thought to be multifactorial including hypoxia, narcotics, polypharmacy. Resolved. 4. Squamous cell carcinoma of the hypopharynx, plan is to begin chemotherapy and radiation therapy next week. 5. Anemia of chronic disease, hemoglobin stable. No evidence of bleeding. 6. Thrombocytopenia, acute on chronic. Significance unclear. Seen January 2009, June 2009, August 2010, March 2011, October 2011, June 2016.  Platelet count improved today. No further evaluation suggested. 7. Chronic pain syndrome.  8. Severe malnutrition. PEG tube in place. Eating. 9. Underweight 10. Tobacco use disorder in remission  Consultants:  General surgery  Procedures:  S/p chest tube placement, left, present on admission  Discharge Instructions  Discharge Instructions    Activity as tolerated - No restrictions    Complete by:  As directed    Diet - low sodium heart healthy    Complete by:  As directed    Discharge instructions    Complete by:  As directed    Call your physician or seek immediate medical attention for shortness of breath, uncontrolled pain or worsening of condition.     Allergies as of 05/25/2016      Reactions   Aspirin Nausea And Vomiting   Codeine Other (See Comments)   Nervousness   Nsaids Nausea And Vomiting      Medication List    TAKE these medications   ALPRAZolam 0.5 MG tablet Commonly known as:  XANAX Take 1 tablet (0.5 mg total) by mouth 3 (three) times daily as needed for anxiety or sleep.   ARIPiprazole 15 MG tablet Commonly known as:  ABILIFY Take 15 mg by mouth  daily.   dicyclomine 10 MG capsule Commonly known as:  BENTYL Take 1 capsule (10 mg total) by mouth  4 (four) times daily -  before meals and at bedtime. As needed What changed:  when to take this  reasons to take this  additional instructions   escitalopram 20 MG tablet Commonly known as:  LEXAPRO Take 30 mg by mouth daily. 1.5 tablet   lubiprostone 8 MCG capsule Commonly known as:  AMITIZA Take 1 capsule (8 mcg total) by mouth 2 (two) times daily with a meal.   methylphenidate 36 MG CR tablet Commonly known as:  CONCERTA Take 36 mg by mouth daily.   oxybutynin 5 MG tablet Commonly known as:  DITROPAN Take 5 mg by mouth 3 (three) times daily.   oxyCODONE 5 MG immediate release tablet Commonly known as:  Oxy IR/ROXICODONE Take 1 tablet (5 mg total) by mouth every 4 (four) hours as needed for severe pain. What changed:  Another medication with the same name was removed. Continue taking this medication, and follow the directions you see here.   oxyCODONE 40 mg 12 hr tablet Commonly known as:  OXYCONTIN Take 1 tablet (40 mg total) by mouth every 12 (twelve) hours. What changed:  Another medication with the same name was removed. Continue taking this medication, and follow the directions you see here.   traZODone 150 MG tablet Commonly known as:  DESYREL Take 150 mg by mouth at bedtime.   zolpidem 10 MG tablet Commonly known as:  AMBIEN Take 10 mg by mouth at bedtime.      Allergies  Allergen Reactions  . Aspirin Nausea And Vomiting  . Codeine Other (See Comments)    Nervousness  . Nsaids Nausea And Vomiting    The results of significant diagnostics from this hospitalization (including imaging, microbiology, ancillary and laboratory) are listed below for reference.    Significant Diagnostic Studies: Dg Chest 1v Repeat Same Day  Result Date: 05/22/2016 CLINICAL DATA:  Left pneumothorax. EXAM: CHEST - 1 VIEW SAME DAY 4:29 p.m. COMPARISON:  05/22/2016 at 2:06 p.m. FINDINGS: Small left chest tube is been inserted in the left pneumothorax has almost completely  resolved with a small residual apical component remaining. There is slight atelectasis at the left lung base. Right lung is clear. Power port in place. Heart size and vascularity are normal. IMPRESSION: Almost complete relief of the left pneumothorax of the tiny residual apical component. Electronically Signed   By: Lorriane Shire M.D.   On: 05/22/2016 16:51   Dg Chest Port 1 View  Result Date: 05/25/2016 CLINICAL DATA:  Recurrent left pneumothorax. Chest tube came out and was replaced. EXAM: PORTABLE CHEST 1 VIEW COMPARISON:  05/24/2016 FINDINGS: Power port type central venous catheter with tip over the low SVC region. Normal heart size and pulmonary vascularity. Infiltration or atelectasis with small effusion in the left costophrenic angle. This is improving since previous study. I do not see the left chest tube. No visible pneumothorax. Right lung is clear. IMPRESSION: Left basilar atelectasis with small pleural effusion, improved since previous study. Left chest tube is not visualized. No visible pneumothorax. Right lung is clear. Electronically Signed   By: Lucienne Capers M.D.   On: 05/25/2016 00:45   Dg Chest Port 1 View  Result Date: 05/24/2016 CLINICAL DATA:  Followup LEFT pneumothorax following chest tube suction EXAM: PORTABLE CHEST 1 VIEW COMPARISON:  Portable expiratory exam 1404 hours compared to 05/24/2016 FINDINGS: LEFT thoracostomy tube unchanged. LEFT subclavian Port-A-Cath with  tip projecting over SVC. Normal heart size, mediastinal contours, and pulmonary vascularity. Bibasilar atelectasis consistent with expiratory technique. No definite LEFT pneumothorax identified. Atherosclerotic calcification aorta. Bones demineralized. IMPRESSION: Bibasilar atelectasis. LEFT thoracostomy tube without pneumothorax. Electronically Signed   By: Lavonia Dana M.D.   On: 05/24/2016 15:21   Dg Chest Port 1 View  Result Date: 05/24/2016 CLINICAL DATA:  Followup left pneumothorax EXAM: PORTABLE CHEST 1  VIEW COMPARISON:  05/23/2016 FINDINGS: Small bore left chest tube is again seen and stable. No definitive pneumothorax is seen. Left chest wall port is noted. Right lung is clear. Mild left basilar atelectasis and small effusion is seen. No other focal abnormality is noted. IMPRESSION: No pneumothorax is noted on the left. Left basilar atelectasis and small effusion are seen. Electronically Signed   By: Inez Catalina M.D.   On: 05/24/2016 07:54   Dg Chest Port 1 View  Result Date: 05/23/2016 CLINICAL DATA:  Left pneumothorax. EXAM: PORTABLE CHEST 1 VIEW COMPARISON:  Radiograph of May 22, 2016. FINDINGS: The heart size and mediastinal contours are within normal limits. Right lung is clear. Minimal left apical pneumothorax is noted which is slightly decreased in size compared to prior exam. Left-sided chest tube is again noted. Stable left basilar opacity is noted consistent with pneumonia or atelectasis and associated minimal pleural effusion. The visualized skeletal structures are unremarkable. IMPRESSION: Minimal left apical pneumothorax which is slightly decreased compared to prior exam. Left-sided chest tube is again noted. Stable left basilar subsegmental atelectasis or pneumonia with minimal associated pleural effusion. Electronically Signed   By: Marijo Conception, M.D.   On: 05/23/2016 07:25   Dg Chest Port 1 View  Result Date: 05/22/2016 CLINICAL DATA:  Pneumothorax, followup EXAM: PORTABLE CHEST 1 VIEW COMPARISON:  Portable exam 0904 hours compared to 05/21/2016 FINDINGS: Small bore LEFT thoracostomy tube unchanged. Normal heart size, mediastinal contours, pulmonary vascularity. Atherosclerotic calcification aorta. Moderate LEFT pneumothorax greatest at base but increased at apex as well. Associated LEFT basilar atelectasis. No mediastinal shift. RIGHT lung clear. No pleural effusion or acute osseous findings. IMPRESSION: Moderate LEFT pneumothorax despite thoracostomy tube most significantly  increased at LEFT lung base. No mediastinal shift. Associated LEFT basilar atelectasis. Findings called to Dr. Arnoldo Morale on 05/22/2016 at 0927 hours. Electronically Signed   By: Lavonia Dana M.D.   On: 05/22/2016 09:27   Dg Chest Port 1 View  Result Date: 05/21/2016 CLINICAL DATA:  Left pneumothorax EXAM: PORTABLE CHEST 1 VIEW COMPARISON:  05/19/2016 FINDINGS: Small left apical pneumothorax again noted, unchanged. Small bore left chest tube is in stable position. There is hyperinflation of the lungs compatible with COPD. Left Port-A-Cath is unchanged. Heart is normal size. No confluent opacities. IMPRESSION: COPD. Stable tiny left apical pneumothorax with small bore left chest tube in place. Electronically Signed   By: Rolm Baptise M.D.   On: 05/21/2016 07:44   Dg Chest Port 1v Same Day  Result Date: 05/22/2016 CLINICAL DATA:  LEFT pneumothorax, followup EXAM: PORTABLE CHEST 1 VIEW COMPARISON:  Portable exam 1406 hours compared to 0904 hours FINDINGS: LEFT thoracostomy tube unchanged. LEFT pneumothorax is perhaps minimally larger at the upper LEFT hemithorax, unchanged inferiorly. Partial atelectasis of LEFT lung base. Central peribronchial thickening again seen. RIGHT lung clear. Stable heart size and mediastinal shift contours without mediastinal shift. IMPRESSION: Persistent LEFT pneumothorax, perhaps minimally larger at the upper LEFT hemithorax versus previous exam though the basilar portion appears unchanged. Partial atelectasis LEFT lung base. Findings discussed with Dr. Arnoldo Morale on 05/22/2016  at 1430 hours. Electronically Signed   By: Lavonia Dana M.D.   On: 05/22/2016 14:32   Labs: Basic Metabolic Panel:  Recent Labs Lab 05/19/16 2005 05/20/16 0552 05/22/16 0504 05/24/16 0601  NA 133* 136 136 136  K 4.1 4.0 3.7 3.8  CL 94* 96* 99* 100*  CO2 34* 35* 31 32  GLUCOSE 99 108* 103* 94  BUN 12 10 7  5*  CREATININE 0.56 0.45 0.40* 0.35*  CALCIUM 8.5* 8.7* 8.8* 8.4*   Liver Function  Tests:  Recent Labs Lab 05/19/16 2005 05/20/16 0552  AST 23 20  ALT 17 15  ALKPHOS 43 41  BILITOT 0.4 0.4  PROT 6.7 6.3*  ALBUMIN 2.7* 2.5*   CBC:  Recent Labs Lab 05/19/16 2005 05/20/16 0552 05/22/16 0504 05/24/16 0601 05/25/16 0818  WBC 6.5 4.4 3.2* 2.4* 4.0  NEUTROABS 4.9  --   --   --   --   HGB 8.2* 8.2* 8.5* 7.4* 8.5*  HCT 26.6* 26.5* 25.9* 23.8* 27.1*  MCV 82.4 82.6 80.4 81.2 80.4  PLT 154 129* 123* 84* 119*     Principal Problem:   Pneumothorax on left Active Problems:   Squamous cell cancer of hypopharynx (HCC)   Cancer of epiglottis (suprahyoid portion) (HCC)   Protein-calorie malnutrition, severe   Acute respiratory failure with hypoxia (HCC)   Anemia of chronic disease   Thrombocytopenia (HCC)   Time coordinating discharge: 35 minutes  Signed:  Murray Hodgkins, MD [facility] 05/25/2016, 2:32 PM
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
## 2016-05-25 NOTE — Clinical Social Work Placement (Signed)
   CLINICAL SOCIAL WORK PLACEMENT  NOTE  Date:  05/25/2016  Patient Details  Name: Tiffany Hale MRN: [id number] Date of Birth: 06-May-1955  Clinical Social Work is seeking post-discharge placement for this patient at the [place] level of care (*CSW will initial, date and re-position this form in  chart as items are completed):  Yes   Patient/family provided with [place] Work Department's list of facilities offering this level of care within the geographic area requested by the patient (or if unable, by the patient's family).  Yes   Patient/family informed of their freedom to choose among providers that offer the needed level of care, that participate in Medicare, Medicaid or managed care program needed by the patient, have an available bed and are willing to accept the patient.  Yes   Patient/family informed of [facility]'s ownership interest in [facility] and [facility], as well as of the fact that they are under no obligation to receive care at these facilities.  PASRR submitted to EDS on 05/25/16     PASRR number received on       Existing PASRR number confirmed on       FL2 transmitted to all facilities in geographic area requested by pt/family on       FL2 transmitted to all facilities within larger geographic area on       Patient informed that his/her managed care company has contracts with or will negotiate with certain facilities, including the following:            Patient/family informed of bed offers received.  Patient chooses bed at       Physician recommends and patient chooses bed at      Patient to be transferred to   on  .  Patient to be transferred to facility by       Patient family notified on   of transfer.  Name of family member notified:        PHYSICIAN       Additional Comment:    _______________________________________________ Ihor Gully, LCSW 05/25/2016, 11:50 AM

## 2016-05-25 NOTE — Progress Notes (Signed)
Patient pulled out chest tube yesterday evening. Chest x-ray this morning shows no pneumothorax. Patient okay for discharge. Home health will be seeing patient to manage tube feedings.

## 2016-05-25 NOTE — Clinical Social Work Note (Signed)
Clinical Social Work Assessment  Patient Details  Name: Tiffany Hale MRN: [id number] Date of Birth: [birth year]  Date of referral:  05/25/16               Reason for consult:  Discharge Planning                Permission sought to share information with:  Family Supports Permission granted to share information::  Yes, Verbal Permission Granted  Name::     International aid/development worker::     Relationship::  son  Contact Information:     Housing/Transportation Living arrangements for the past 2 months:  Single Family Home Source of Information:  Patient, Adult Children Patient Interpreter Needed:  None Criminal Activity/Legal Involvement Pertinent to Current Situation/Hospitalization:  No - Comment as needed Significant Relationships:  Siblings, Parents Lives with:  Friends Do you feel safe going back to the place where you live?  Yes Need for family participation in patient care:  Yes (Comment)  Care giving concerns:  Pt's son feels that pt cannot manage care on her own.    Social Worker assessment / plan:  CSW met with pt and pt's son, [name] at bedside. [name] reports he lives in [place]. Pt has been living with a friend and has support from her mother and sister. She indicates she has radiation Monday through Friday in [place] and chemo three days a week here in [place]. Pt was refusing placement earlier this week, but agreed to consider it today. Pt's son feels that she cannot manage her care on her own, but pt states she can do everything independently and was seen ambulating in room. He is aware that facilities will not be able to provide extensive transportation needs and that this would be family's responsibility. Son states he will arrange. [facility] has assessed pt and cannot take due to PEG tube. [facility] to assess pt soon. They are both aware that pt is already d/c and pt will have to return home if no bed found. Pt indicates she would prefer this anyway, but will wait and  see.   Employment status:  Disabled (Comment on whether or not currently receiving Disability) Insurance information:  Medicaid [place] PT Recommendations:  Not assessed at this time Information / Referral to community resources:  Other (Comment Required) (ALF)  Patient/Family's Response to care:  Pt agreeable to ALF bed search in [facility] and [place].   Patient/Family's Understanding of and Emotional Response to Diagnosis, Current Treatment, and Prognosis:  Pt and son aware of plan to d/c today.   Emotional Assessment Appearance:  Appears stated age Attitude/Demeanor/Rapport:  Other (Cooperative) Affect (typically observed):  Accepting Orientation:  Oriented to Self, Oriented to Place, Oriented to  Time, Oriented to Situation Alcohol / Substance use:  Not Applicable Psych involvement (Current and /or in the community):  No (Comment)  Discharge Needs  Concerns to be addressed:  Discharge Planning Concerns Readmission within the last 30 days:  No Current discharge risk:  Other (cancer treatment) Barriers to Discharge:  Other (bed search)   Salome Arnt, [place] 05/25/2016, 3:10 PM [phone]

## 2016-05-25 NOTE — Clinical Social Work Placement (Signed)
   CLINICAL SOCIAL WORK PLACEMENT  NOTE  Date:  05/25/2016  Patient Details  Name: Tiffany Hale MRN: [id number] Date of Birth: [birth year]  Clinical Social Work is seeking post-discharge placement for this patient at the [place] level of care (*CSW will initial, date and re-position this form in  chart as items are completed):  Yes   Patient/family provided with [facility] Work Department's list of facilities offering this level of care within the geographic area requested by the patient (or if unable, by the patient's family).  Yes   Patient/family informed of their freedom to choose among providers that offer the needed level of care, that participate in Medicare, Medicaid or managed care program needed by the patient, have an available bed and are willing to accept the patient.  Yes   Patient/family informed of [place]'s ownership interest in [facility] and [facility], as well as of the fact that they are under no obligation to receive care at these facilities.  PASRR submitted to EDS on 05/25/16     PASRR number received on       Existing PASRR number confirmed on       FL2 transmitted to all facilities in geographic area requested by pt/family on       FL2 transmitted to all facilities within larger geographic area on       Patient informed that his/her managed care company has contracts with or will negotiate with certain facilities, including the following:            Patient/family informed of bed offers received.  Patient chooses bed at       Physician recommends and patient chooses bed at      Patient to be transferred to   on  .  Patient to be transferred to facility by       Patient family notified on   of transfer.  Name of family member notified:        PHYSICIAN       Additional Comment:    _______________________________________________ Salome Arnt, [place] 05/25/2016, 3:07  PM [phone]

## 2016-05-26 ENCOUNTER — Telehealth (HOSPITAL_COMMUNITY): Payer: Self-pay | Admitting: Emergency Medicine

## 2016-05-26 ENCOUNTER — Ambulatory Visit: Payer: Medicaid Other

## 2016-05-26 NOTE — Telephone Encounter (Signed)
Left message for [facility] to call me back

## 2016-05-26 NOTE — Telephone Encounter (Signed)
Pt and sister called about getting the pt placed somewhere.  I explained that it would have to come from the PCP.   I explained that where ever she is placed that she will need help with transportation, it will be very important in her care.  We can not do anything about her medications unless she files a police report.  If she brings [place] a police report the I can show it to the doctor.  Sister was thankful for the information.

## 2016-05-29 ENCOUNTER — Ambulatory Visit: Payer: Medicaid Other

## 2016-05-30 ENCOUNTER — Encounter: Payer: Self-pay | Admitting: *Deleted

## 2016-05-30 ENCOUNTER — Telehealth (HOSPITAL_COMMUNITY): Payer: Self-pay | Admitting: Emergency Medicine

## 2016-05-30 ENCOUNTER — Ambulatory Visit: Payer: Medicaid Other

## 2016-05-30 NOTE — Telephone Encounter (Signed)
Tiffany Hale called after he had spoke with Tiffany Hale today.  Pt is awaiting phone call from [facility] to see if they will accept her as a patient.  (which she really does not have a skilled nursing facility need)  If they do then she would have a ride to [place] to radiation.  If they do not then she will not have reliable transportation to get to radiation every day since the son has taken her truck.  She can not make it to radiation in [facility] tomorrow thus we will not start chemotherapy either.  I have spoke with Tiffany Hale and Tiffany Hale.  Tiffany Hale still wants to see the pt in clinic tomorrow.  She will reach out to Dr Tiffany Hale to see how we can better handle this pt's care.  I spoke with the pt and she is in agreement to come to her doctors appt with Tiffany Hale at 1:40 pm.  I explained that we are going to cancel her radiation and chemo for tomorrow.

## 2016-05-30 NOTE — Progress Notes (Signed)
[place] Clinical Social Work  Clinical Social Work was referred by patient navigator for assessment of psychosocial needs due to ongoing psychosocial issues. Clinical Social Worker contacted patient at home to offer support and re-assess for needs.  CSW had hoped to meet with pt re. Resources and supports at clinic last week, but pt was admitted. CSW re-introduced self and explained role of CSW. Pt reports her son "was supposed to help" her, but has been having "issues". Pt disclosed her son took her medicine and is "leaving it in the mailbox" for her at random times. CSW explained medicine was prescribed to her and the issues and concerns around this current arrangement. Pt shared that her son had issues with ETOH in the past. She states she was told he was able to control her medicines. She states he had keys to her home and she had a locked medicine box that he also had keys to as well. CSW explained the medical team members prescribed these medicines to her, not him and they cannot manage them properly in his possession. Pt was confused that son had the right to make decisions and act on her behalf due to being an emergency contact. CSW explained that pt has right to make her own medical decisions until completely unable and the differences b/w emergency contact and HCPOA. Pt appears to be coming to terms that son is not acting in her best interest currently. CSW encouraged pt to file a police report about her medicine and she will consider.   CSW reviewed transportation options with pt again; [employer], [employer] and medicaid. CSW inquired if additional family members could assist. Pt reports her family members "have stuff", but they do not work currently. CSW reviewed days [employer] can drive her and the need for family to assist on the other days, regardless. Pt reports family and her PCP are attempting to place her at [facility]. She is hopeful for bed in the near future. CSW will be surprised if this  arrangement works out, as only apparent skilled need is PEG currently. Transportation will continue to be a concern, eben if placement occurs. Pt planned to call resources to inquire about getting transportation set up. She appreciated call and would let us know an update soon.   Clinical Social Work interventions: Resource education and referral Supportive listening.    Loren Racer, LCSW, OSW-C [place] Tuesdays   [phone]

## 2016-05-31 ENCOUNTER — Inpatient Hospital Stay (HOSPITAL_COMMUNITY): Payer: Medicaid Other

## 2016-05-31 ENCOUNTER — Encounter (HOSPITAL_COMMUNITY): Payer: Self-pay | Admitting: Adult Health

## 2016-05-31 ENCOUNTER — Encounter (HOSPITAL_COMMUNITY): Payer: Medicaid Other | Attending: Hematology & Oncology | Admitting: Adult Health

## 2016-05-31 ENCOUNTER — Ambulatory Visit: Payer: Medicaid Other | Admitting: Radiation Oncology

## 2016-05-31 ENCOUNTER — Encounter: Payer: Self-pay | Admitting: Dietician

## 2016-05-31 ENCOUNTER — Encounter (HOSPITAL_COMMUNITY): Payer: Medicaid Other

## 2016-05-31 ENCOUNTER — Ambulatory Visit (HOSPITAL_COMMUNITY): Payer: Medicaid Other

## 2016-05-31 VITALS — BP 130/58 | HR 82 | Temp 98.7°F | Resp 18 | Wt 100.3 lb

## 2016-05-31 DIAGNOSIS — R634 Abnormal weight loss: Secondary | ICD-10-CM

## 2016-05-31 DIAGNOSIS — C139 Malignant neoplasm of hypopharynx, unspecified: Secondary | ICD-10-CM | POA: Diagnosis not present

## 2016-05-31 DIAGNOSIS — B370 Candidal stomatitis: Secondary | ICD-10-CM

## 2016-05-31 DIAGNOSIS — K5900 Constipation, unspecified: Secondary | ICD-10-CM | POA: Diagnosis not present

## 2016-05-31 DIAGNOSIS — R54 Age-related physical debility: Secondary | ICD-10-CM

## 2016-05-31 DIAGNOSIS — G893 Neoplasm related pain (acute) (chronic): Secondary | ICD-10-CM | POA: Diagnosis not present

## 2016-05-31 DIAGNOSIS — Z95828 Presence of other vascular implants and grafts: Secondary | ICD-10-CM | POA: Insufficient documentation

## 2016-05-31 DIAGNOSIS — D649 Anemia, unspecified: Secondary | ICD-10-CM | POA: Diagnosis not present

## 2016-05-31 DIAGNOSIS — C321 Malignant neoplasm of supraglottis: Secondary | ICD-10-CM

## 2016-05-31 LAB — COMPREHENSIVE METABOLIC PANEL
ALBUMIN: 2.9 g/dL — AB (ref 3.5–5.0)
ALT: 33 U/L (ref 14–54)
AST: 40 U/L (ref 15–41)
Alkaline Phosphatase: 46 U/L (ref 38–126)
Anion gap: 7 (ref 5–15)
BUN: 8 mg/dL (ref 6–20)
CO2: 28 mmol/L (ref 22–32)
CREATININE: 0.45 mg/dL (ref 0.44–1.00)
Calcium: 8.9 mg/dL (ref 8.9–10.3)
Chloride: 99 mmol/L — ABNORMAL LOW (ref 101–111)
GFR calc Af Amer: >60 mL/min (ref >60)
GFR calc non Af Amer: >60 mL/min (ref >60)
GLUCOSE: 80 mg/dL (ref 65–99)
POTASSIUM: 3.7 mmol/L (ref 3.5–5.1)
Sodium: 134 mmol/L — ABNORMAL LOW (ref 135–145)
Total Bilirubin: 0.3 mg/dL (ref 0.3–1.2)
Total Protein: 7.4 g/dL (ref 6.5–8.1)

## 2016-05-31 LAB — CBC WITH DIFFERENTIAL/PLATELET
BASOS ABS: 0 10*3/uL (ref 0.0–0.1)
BASOS PCT: 0 %
EOS PCT: 1 %
Eosinophils Absolute: 0.1 10*3/uL (ref 0.0–0.7)
HCT: 25.6 % — ABNORMAL LOW (ref 36.0–46.0)
Hemoglobin: 8 g/dL — ABNORMAL LOW (ref 12.0–15.0)
LYMPHS PCT: 15 %
Lymphs Abs: 0.8 10*3/uL (ref 0.7–4.0)
MCH: 24.8 pg — ABNORMAL LOW (ref 26.0–34.0)
MCHC: 31.3 g/dL (ref 30.0–36.0)
MCV: 79.5 fL (ref 78.0–100.0)
MONO ABS: 0.5 10*3/uL (ref 0.1–1.0)
Monocytes Relative: 10 %
NEUTROS ABS: 3.8 10*3/uL (ref 1.7–7.7)
Neutrophils Relative %: 74 %
Platelets: 155 10*3/uL (ref 150–400)
RBC: 3.22 MIL/uL — AB (ref 3.87–5.11)
RDW: 15.7 % — ABNORMAL HIGH (ref 11.5–15.5)
WBC: 5.1 10*3/uL (ref 4.0–10.5)

## 2016-05-31 MED ORDER — MORPHINE SULFATE (PF) 2 MG/ML IV SOLN
INTRAVENOUS | Status: AC
  Filled 2016-05-31: qty 1

## 2016-05-31 MED ORDER — SODIUM CHLORIDE 0.9% FLUSH
20.0000 mL | INTRAVENOUS | Status: DC | PRN
  Administered 2016-05-31: 20 mL via INTRAVENOUS
  Filled 2016-05-31: qty 20

## 2016-05-31 MED ORDER — FLUCONAZOLE 100 MG PO TABS: 100.0000 mg | ORAL_TABLET | Freq: Every day | ORAL | 0 refills | Status: DC

## 2016-05-31 MED ORDER — HEPARIN SOD (PORK) LOCK FLUSH 100 UNIT/ML IV SOLN
500.0000 [IU] | Freq: Once | INTRAVENOUS | Status: AC
  Administered 2016-05-31: 500 [IU] via INTRAVENOUS

## 2016-05-31 MED ORDER — HEPARIN SOD (PORK) LOCK FLUSH 100 UNIT/ML IV SOLN
INTRAVENOUS | Status: AC
  Filled 2016-05-31: qty 5

## 2016-05-31 MED ORDER — MORPHINE SULFATE (PF) 2 MG/ML IV SOLN
2.0000 mg | Freq: Once | INTRAVENOUS | Status: AC
Start: 2016-05-31 — End: 2016-05-31
  Administered 2016-05-31: 2 mg via INTRAVENOUS

## 2016-05-31 NOTE — Progress Notes (Signed)
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   Had peg/cath placed. Suffered pneumothorax as post op complication and was subsequently hospitalized for 1 week starting on 2/7. TF was starting during hospitalization and pt had been tolerating.   On discharge, pt reverted to not using tube. She has a very complicated social situation that has removed her primary caregiver from the picture. She has limited transportation, medication and help with tube feeding now. In addition to further compromising nutritional status, these have led to postponement of treatment  RD asked to visit due to rapid weight loss and minimal TF intake.   Contacted Pt by visiting during visit.   Wt Readings from Last 10 Encounters:  05/31/16 100 lb 4.8 oz (45.5 kg)  05/20/16 108 lb 11 oz (49.3 kg)  05/15/16 103 lb (46.7 kg)  05/11/16 105 lb 12.8 oz (48 kg)  05/10/16 108 lb 11 oz (49.3 kg)  05/09/16 106 lb (48.1 kg)  05/08/16 107 lb 3.2 oz (48.6 kg)  05/02/16 106 lb 8 oz (48.3 kg)  03/01/16 107 lb (48.5 kg)  11/16/15 119 lb (54 kg)   Patient was  admitted to hospital a day later at 108.7 lbs.   Wt had been stable at 105-108 before that time. She has lost roughly 5% bw in the last few weeks alone.  Pt reports she has been infusing 1 can 2x a day (flush w/ 30 cc before and after each  and drinking 2 boost supplements a day without issue.  However, sister who enters later, reports they have been infusing only 1/2-3/4ths a can because of patient saying she can not tolerate. Pt denies saying this and says she has not had any problems with tolerance.   RD emphasized that her TF intake needs to be greatly increased, and quickly. She needs ATLEAST 6 full cans a day.   RD asked her to attempt to infuse 1.5 cans BID tomorrow, with only 30 ml flush before and after. Because she is not going to be infusing excess water to meet fluid needs, told To make up for extra fluid, by drinking 2 8 oz glasses of water and her 2 supplements to  receive 30 oz (960 cc fluid).   With 30 ml flushes before and after her total feed volume is 416 mls at a time, which should be very feasible. She agreed to try this. If tolerates, asked her to infuse 1.5 cans 4x daily to meet 100% of needs. If does not tolerate, will need to infuse 1 can 6x daily.  RD discussed alternatively using nocturnal feeding via pump. Explained pros of less overall time managing TF, but cons of loss of freedom and risk of accidental tube disconnection with TF mess. She desires to continue with bolus.   Both pt and sister report good fluid intake and drinking Boost. For each Boost the patient drinks, she can remove 1 can from feeding goal. However, re-emphasized that once radiation starts, she will likely not be able to take oral intake as well, therefore she should try to learn to get majority of PO intake via tube.   Sister voiced she should hear from SNF today if patient was accepted. RD stated he thought this would be a good idea for the patient. Pt did not comment on this.   She is provided with Ensure and Boost Coupons, as well as written instructions on her TF regimen. RD provided contact information and she is directed to call with al TF questions. RD  stated he would follow up via phone next week regardless.   Pt reports feeling all right. Denied n/v/d. Has ongoing issues with constipation, though had BM yesterday. She is very frail and cachectic. Expected compliance with TF regimen is very questionable. Remains very high risk patient.   Discussed finding with NP. Voiced pt has still not a swallow evaluation. NP to schedule outpatient referral  Left my contact info, coupons, and handouts titled "Feeding Tube Use and Care (Bolus/Syringe Feedings)"  Burtis Junes RD, LDN, CNSC Nutrition Pager: [phone] 05/31/2016 3:23 PM

## 2016-05-31 NOTE — Patient Instructions (Addendum)
[facility] Discharge Instructions  RECOMMENDATIONS MADE BY THE CONSULTANT AND ANY TEST RESULTS WILL BE SENT TO YOUR REFERRING PHYSICIAN.  Exam with Mike Craze, NP. Please be sure that you see the speech therapist, we have put in a referral for this.  We will be in touch with you for further follow up after we speak with Dr. Isidore Moos.    Thank you for choosing [place] [facility] to provide your oncology and hematology care.  To afford each patient quality time with our provider, please arrive at least 15 minutes before your scheduled appointment time.    If you have a lab appointment with the [facility] please come in thru the  Main Entrance and check in at the main information desk  You need to re-schedule your appointment should you arrive 10 or more minutes late.  We strive to give you quality time with our providers, and arriving late affects you and other patients whose appointments are after yours.  Also, if you no show three or more times for appointments you may be dismissed from the clinic at the providers discretion.     Again, thank you for choosing [facility].  Our hope is that these requests will decrease the amount of time that you wait before being seen by our physicians.       _____________________________________________________________  Should you have questions after your visit to [facility], please contact our office at [phone] between the hours of 8:30 a.m. and 4:30 p.m.  Voicemails left after 4:30 p.m. will not be returned until the following business day.  For prescription refill requests, have your pharmacy contact our office.       Resources For Cancer Patients and their Caregivers ? American Cancer Society: Can assist with transportation, wigs, general needs, runs Look Good Feel Better.        660-788-4117 ? Cancer Care: Provides financial assistance,  online support groups, medication/co-pay assistance.  1-800-813-HOPE 713 378 6196) ? [facility] Assists [place] cancer patients and their families through emotional , educational and financial support.  (267)422-7960 ? [place] DSS Where to apply for food stamps, Medicaid and utility assistance. (204)273-8006 ? [facility]: Transportation to medical appointments. 209-192-9136 ? Social Security Administration: May apply for disability if have a Stage IV cancer. 412-326-9949 212-426-2128 ? [employer], Disability and Transit Services: Assists with nutrition, care and transit needs. [place] Support Programs: @10RELATIVEDAYS @ > Cancer Support Group  2nd Tuesday of the month 1pm-2pm, Journey Room  > Creative Journey  3rd Tuesday of the month 1130am-1pm, Journey Room  > Look Good Feel Better  1st Wednesday of the month 10am-12 noon, Journey Room (Call [place] to register [phone])

## 2016-05-31 NOTE — Progress Notes (Signed)
Chemotherapy teaching on cisplatin.  Consent signed.  Extensive teaching packet given.

## 2016-05-31 NOTE — Pre-Procedure Instructions (Signed)
Left neck mass
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
## 2016-06-01 ENCOUNTER — Telehealth (HOSPITAL_COMMUNITY): Payer: Self-pay | Admitting: Adult Health

## 2016-06-01 ENCOUNTER — Telehealth: Payer: Self-pay | Admitting: *Deleted

## 2016-06-01 ENCOUNTER — Ambulatory Visit: Payer: Medicaid Other

## 2016-06-01 ENCOUNTER — Telehealth (HOSPITAL_COMMUNITY): Payer: Self-pay | Admitting: Emergency Medicine

## 2016-06-01 DIAGNOSIS — C321 Malignant neoplasm of supraglottis: Secondary | ICD-10-CM

## 2016-06-01 MED ORDER — PREDNISONE 20 MG PO TABS: 20.0000 mg | ORAL_TABLET | Freq: Every day | ORAL | 0 refills | Status: DC
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
## 2016-06-01 NOTE — Telephone Encounter (Signed)
I had a message left on my voicemail that said I needed to call them regarding Tiffany Hale and her sister but no name or telephone number was left.  Stated that it was an emergency and they needed information.  I called to talk to [facility] and she had no idea what I was talking about.  She said no one had called me.  Cristie said that the [facility] would not take her.  I told her that we are trying to work on the situation for her to see how to best get her care.  I told her as soon as I know anything I would call her and let her know.  She was thankful.

## 2016-06-01 NOTE — Telephone Encounter (Signed)
Oncology Nurse Navigator Documentation  Received call from Ms. Sylvester' cousin, Lupita Raider, his wife Janace Hoard is Ms. Rumberger' niece.  He noted [name] and her mother are also having serious medical issues as well. He expressed concern:  Because of transportation issues, her RT tmts need to be transferred to [facility].  I explained per conversation between Dr. Isidore Moos and Mike Craze, NP [facility] yesterday, steps are being taken to transfer her RT [facility]).    For her overall health status, her ability to care for herself as she is in state of decline.  I assured him Shellia Carwin, [facility] Oncology Navigator and Elzie Rings are doing all they can to address her care needs. I provided him [facility] [place] and [employer] phone numbers, encouraged him to contact them for further information/guidance. He voiced appreciation for information.  Note routed to [place].  [name], RN, BSN, [place] Oncology Nurse [place] [facility] [place] [phone]

## 2016-06-02 ENCOUNTER — Other Ambulatory Visit (HOSPITAL_COMMUNITY): Payer: Self-pay | Admitting: Emergency Medicine

## 2016-06-02 ENCOUNTER — Emergency Department (HOSPITAL_COMMUNITY)
Admission: EM | Admit: 2016-06-02 | Discharge: 2016-06-02 | Disposition: A | Discharge status: Home / Self Care | Payer: Medicaid Other | Attending: Emergency Medicine | Admitting: Emergency Medicine

## 2016-06-02 ENCOUNTER — Telehealth (HOSPITAL_COMMUNITY): Payer: Self-pay | Admitting: Emergency Medicine

## 2016-06-02 ENCOUNTER — Ambulatory Visit (HOSPITAL_COMMUNITY): Payer: Medicaid Other

## 2016-06-02 ENCOUNTER — Ambulatory Visit: Payer: Medicaid Other

## 2016-06-02 ENCOUNTER — Encounter (HOSPITAL_COMMUNITY): Payer: Self-pay | Admitting: Lab

## 2016-06-02 ENCOUNTER — Encounter (HOSPITAL_COMMUNITY): Payer: Self-pay | Admitting: Emergency Medicine

## 2016-06-02 DIAGNOSIS — Z85828 Personal history of other malignant neoplasm of skin: Secondary | ICD-10-CM | POA: Diagnosis not present

## 2016-06-02 DIAGNOSIS — Z87891 Personal history of nicotine dependence: Secondary | ICD-10-CM | POA: Diagnosis not present

## 2016-06-02 DIAGNOSIS — M542 Cervicalgia: Secondary | ICD-10-CM | POA: Diagnosis present

## 2016-06-02 DIAGNOSIS — J45909 Unspecified asthma, uncomplicated: Secondary | ICD-10-CM | POA: Diagnosis not present

## 2016-06-02 MED ORDER — MORPHINE SULFATE 30 MG PO TABS: 30.0000 mg | ORAL_TABLET | Freq: Four times a day (QID) | ORAL | 0 refills | Status: DC | PRN

## 2016-06-02 MED ORDER — OXYCODONE HCL ER 40 MG PO T12A: 40.0000 mg | EXTENDED_RELEASE_TABLET | Freq: Two times a day (BID) | ORAL | 0 refills | Status: DC
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
## 2016-06-02 NOTE — ED Notes (Signed)
EDP in to assess 

## 2016-06-02 NOTE — Telephone Encounter (Signed)
Called pt to let her know that we are going to still do fluids twice a week even though we are not doing chemotherapy.  We are going to start Monday 06/05/2016 at 1:15 pm.  She verbalized understanding.  I also explained we would monitor her blood work weekly.  She has an appt with RAD ONC in [place] 06/06/2016 at 9:00 am and they will also SIM her that day too.
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
## 2016-06-02 NOTE — Discharge Instructions (Signed)
As discussed, it is important that you follow up as soon as possible with your physician for continued management of your condition. ° °If you develop any new, or concerning changes in your condition, please return to the emergency department immediately. ° °

## 2016-06-02 NOTE — Progress Notes (Unsigned)
Referral sent to [facility]. appt 2/27.  Records faxed on 2/23

## 2016-06-05 ENCOUNTER — Ambulatory Visit: Payer: Medicaid Other

## 2016-06-05 ENCOUNTER — Encounter (HOSPITAL_COMMUNITY): Payer: Self-pay | Admitting: Adult Health

## 2016-06-05 ENCOUNTER — Ambulatory Visit: Admission: RE | Admit: 2016-06-05 | Payer: Medicaid Other | Source: Ambulatory Visit

## 2016-06-05 ENCOUNTER — Other Ambulatory Visit (HOSPITAL_COMMUNITY): Payer: Self-pay | Admitting: Adult Health

## 2016-06-05 ENCOUNTER — Encounter (HOSPITAL_BASED_OUTPATIENT_CLINIC_OR_DEPARTMENT_OTHER): Payer: Medicaid Other

## 2016-06-05 VITALS — BP 105/53 | HR 71 | Temp 98.1°F | Resp 16 | Wt 107.0 lb

## 2016-06-05 DIAGNOSIS — C139 Malignant neoplasm of hypopharynx, unspecified: Secondary | ICD-10-CM

## 2016-06-05 DIAGNOSIS — G893 Neoplasm related pain (acute) (chronic): Secondary | ICD-10-CM

## 2016-06-05 DIAGNOSIS — Z95828 Presence of other vascular implants and grafts: Secondary | ICD-10-CM

## 2016-06-05 DIAGNOSIS — C321 Malignant neoplasm of supraglottis: Secondary | ICD-10-CM | POA: Diagnosis not present

## 2016-06-05 LAB — CBC WITH DIFFERENTIAL/PLATELET
BASOS ABS: 0 10*3/uL (ref 0.0–0.1)
Basophils Relative: 0 %
EOS PCT: 0 %
Eosinophils Absolute: 0 10*3/uL (ref 0.0–0.7)
HCT: 24.7 % — ABNORMAL LOW (ref 36.0–46.0)
HEMOGLOBIN: 7.6 g/dL — AB (ref 12.0–15.0)
LYMPHS ABS: 0.2 10*3/uL — AB (ref 0.7–4.0)
LYMPHS PCT: 5 %
MCH: 24.5 pg — AB (ref 26.0–34.0)
MCHC: 30.8 g/dL (ref 30.0–36.0)
MCV: 79.7 fL (ref 78.0–100.0)
Monocytes Absolute: 0.1 10*3/uL (ref 0.1–1.0)
Monocytes Relative: 4 %
NEUTROS ABS: 3.6 10*3/uL (ref 1.7–7.7)
NEUTROS PCT: 92 %
PLATELETS: 103 10*3/uL — AB (ref 150–400)
RBC: 3.1 MIL/uL — AB (ref 3.87–5.11)
RDW: 15.2 % (ref 11.5–15.5)
WBC: 3.9 10*3/uL — AB (ref 4.0–10.5)

## 2016-06-05 LAB — COMPREHENSIVE METABOLIC PANEL
ALK PHOS: 51 U/L (ref 38–126)
ALT: 35 U/L (ref 14–54)
AST: 33 U/L (ref 15–41)
Albumin: 2.7 g/dL — ABNORMAL LOW (ref 3.5–5.0)
Anion gap: 6 (ref 5–15)
BUN: 15 mg/dL (ref 6–20)
CALCIUM: 9 mg/dL (ref 8.9–10.3)
CHLORIDE: 96 mmol/L — AB (ref 101–111)
CO2: 31 mmol/L (ref 22–32)
CREATININE: 0.5 mg/dL (ref 0.44–1.00)
Glucose, Bld: 197 mg/dL — ABNORMAL HIGH (ref 65–99)
Potassium: 4.2 mmol/L (ref 3.5–5.1)
Sodium: 133 mmol/L — ABNORMAL LOW (ref 135–145)
Total Bilirubin: 0.1 mg/dL — ABNORMAL LOW (ref 0.3–1.2)
Total Protein: 7.1 g/dL (ref 6.5–8.1)

## 2016-06-05 LAB — ABO/RH: ABO/RH(D): B POS

## 2016-06-05 LAB — PREPARE RBC (CROSSMATCH)

## 2016-06-05 MED ORDER — MORPHINE SULFATE ER 60 MG PO TBCR: 60.0000 mg | EXTENDED_RELEASE_TABLET | Freq: Two times a day (BID) | ORAL | 0 refills | Status: AC

## 2016-06-05 MED ORDER — SODIUM CHLORIDE 0.9 % IV SOLN
INTRAVENOUS | Status: AC
  Administered 2016-06-05: 14:00:00 via INTRAVENOUS

## 2016-06-05 MED ORDER — SODIUM CHLORIDE 0.9 % IV SOLN: INTRAVENOUS | Status: DC

## 2016-06-05 MED ORDER — MORPHINE SULFATE (PF) 2 MG/ML IV SOLN
INTRAVENOUS | Status: AC
  Filled 2016-06-05: qty 1

## 2016-06-05 MED ORDER — HEPARIN SOD (PORK) LOCK FLUSH 100 UNIT/ML IV SOLN
500.0000 [IU] | Freq: Once | INTRAVENOUS | Status: AC
  Administered 2016-06-05: 500 [IU] via INTRAVENOUS
  Filled 2016-06-05: qty 5

## 2016-06-05 MED ORDER — ALPRAZOLAM 0.5 MG PO TABS: 0.5000 mg | ORAL_TABLET | Freq: Three times a day (TID) | ORAL | 0 refills | Status: AC | PRN

## 2016-06-05 MED ORDER — MORPHINE SULFATE (PF) 2 MG/ML IV SOLN
2.0000 mg | INTRAVENOUS | Status: AC
Start: 2016-06-05 — End: 2016-06-05
  Administered 2016-06-05: 2 mg via INTRAVENOUS

## 2016-06-05 MED ORDER — MORPHINE SULFATE 30 MG PO TABS: 30.0000 mg | ORAL_TABLET | Freq: Four times a day (QID) | ORAL | 0 refills | Status: AC | PRN

## 2016-06-05 MED ORDER — MORPHINE SULFATE 2 MG/ML IJ SOLN
2.0000 mg | INTRAMUSCULAR | Status: AC
  Administered 2016-06-05: 2 mg via INTRAVENOUS
  Filled 2016-06-05: qty 1

## 2016-06-05 NOTE — Patient Instructions (Signed)
[place] at [facility] Discharge Instructions  RECOMMENDATIONS MADE BY THE CONSULTANT AND ANY TEST RESULTS WILL BE SENT TO YOUR REFERRING PHYSICIAN.  IV fluids given as ordered. Return as scheduled.  Thank you for choosing [place] at [facility] to provide your oncology and hematology care.  To afford each patient quality time with our provider, please arrive at least 15 minutes before your scheduled appointment time.    If you have a lab appointment with the [facility] please come in thru the  Main Entrance and check in at the main information desk  You need to re-schedule your appointment should you arrive 10 or more minutes late.  We strive to give you quality time with our providers, and arriving late affects you and other patients whose appointments are after yours.  Also, if you no show three or more times for appointments you may be dismissed from the clinic at the providers discretion.     Again, thank you for choosing [facility].  Our hope is that these requests will decrease the amount of time that you wait before being seen by our physicians.       _____________________________________________________________  Should you have questions after your visit to [facility], please contact our office at [phone] between the hours of 8:30 a.m. and 4:30 p.m.  Voicemails left after 4:30 p.m. will not be returned until the following business day.  For prescription refill requests, have your pharmacy contact our office.       Resources For Cancer Patients and their Caregivers ? American Cancer Society: Can assist with transportation, wigs, general needs, runs Look Good Feel Better.        402-819-8513 ? Cancer Care: Provides financial assistance, online support groups, medication/co-pay assistance.  1-800-813-HOPE 7656195551) ? [place] Assists [place] cancer patients and  their families through emotional , educational and financial support.  (516) 419-1972 ? [place] DSS Where to apply for food stamps, Medicaid and utility assistance. 4503064476 ? RCATS: Transportation to medical appointments. 254 132 7897 ? Social Security Administration: May apply for disability if have a Stage IV cancer. 815-362-7513 251 395 5292 ? [employer], Disability and Transit Services: Assists with nutrition, care and transit needs. [place] Support Programs: @10RELATIVEDAYS @ > Cancer Support Group  2nd Tuesday of the month 1pm-2pm, Journey Room  > Creative Journey  3rd Tuesday of the month 1130am-1pm, Journey Room  > Look Good Feel Better  1st Wednesday of the month 10am-12 noon, Journey Room (Call [facility] to register [phone])

## 2016-06-05 NOTE — Progress Notes (Signed)
Tolerated IV fluids well. Morphine given prior to discharge as ordered per NP. Safety measures reviewed with caregiver, verbalized understanding. Stable and ambulatory on discharge home with caregiver.
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
## 2016-06-06 ENCOUNTER — Encounter (HOSPITAL_BASED_OUTPATIENT_CLINIC_OR_DEPARTMENT_OTHER): Payer: Medicaid Other

## 2016-06-06 ENCOUNTER — Encounter: Payer: Self-pay | Admitting: *Deleted

## 2016-06-06 ENCOUNTER — Ambulatory Visit: Payer: Medicaid Other

## 2016-06-06 DIAGNOSIS — D696 Thrombocytopenia, unspecified: Secondary | ICD-10-CM | POA: Diagnosis present

## 2016-06-06 DIAGNOSIS — C139 Malignant neoplasm of hypopharynx, unspecified: Secondary | ICD-10-CM | POA: Diagnosis not present

## 2016-06-06 DIAGNOSIS — C321 Malignant neoplasm of supraglottis: Secondary | ICD-10-CM

## 2016-06-06 MED ORDER — ACETAMINOPHEN 325 MG PO TABS
650.0000 mg | ORAL_TABLET | Freq: Once | ORAL | Status: AC
  Administered 2016-06-06: 650 mg via ORAL

## 2016-06-06 MED ORDER — DIPHENHYDRAMINE HCL 25 MG PO CAPS
ORAL_CAPSULE | ORAL | Status: AC
  Filled 2016-06-06: qty 1

## 2016-06-06 MED ORDER — SODIUM CHLORIDE 0.9 % IV SOLN
250.0000 mL | Freq: Once | INTRAVENOUS | Status: AC
  Administered 2016-06-06: 250 mL via INTRAVENOUS

## 2016-06-06 MED ORDER — ACETAMINOPHEN 325 MG PO TABS
ORAL_TABLET | ORAL | Status: AC
  Filled 2016-06-06: qty 2

## 2016-06-06 MED ORDER — DIPHENHYDRAMINE HCL 25 MG PO CAPS
25.0000 mg | ORAL_CAPSULE | Freq: Once | ORAL | Status: AC
  Administered 2016-06-06: 25 mg via ORAL

## 2016-06-06 MED ORDER — HEPARIN SOD (PORK) LOCK FLUSH 100 UNIT/ML IV SOLN
500.0000 [IU] | Freq: Every day | INTRAVENOUS | Status: AC | PRN
Start: 2016-06-06 — End: 2016-06-06
  Administered 2016-06-06: 500 [IU]

## 2016-06-06 NOTE — Patient Instructions (Signed)
[place] at [facility] Discharge Instructions  RECOMMENDATIONS MADE BY THE CONSULTANT AND ANY TEST RESULTS WILL BE SENT TO YOUR REFERRING PHYSICIAN.  Today you received a blood transfusion of 2 units packed red blood cells. Return as scheduled.  Thank you for choosing [place] at [facility] to provide your oncology and hematology care.  To afford each patient quality time with our provider, please arrive at least 15 minutes before your scheduled appointment time.    If you have a lab appointment with the [facility] please come in thru the  Main Entrance and check in at the main information desk  You need to re-schedule your appointment should you arrive 10 or more minutes late.  We strive to give you quality time with our providers, and arriving late affects you and other patients whose appointments are after yours.  Also, if you no show three or more times for appointments you may be dismissed from the clinic at the providers discretion.     Again, thank you for choosing [facility].  Our hope is that these requests will decrease the amount of time that you wait before being seen by our physicians.       _____________________________________________________________  Should you have questions after your visit to [facility], please contact our office at [phone] between the hours of 8:30 a.m. and 4:30 p.m.  Voicemails left after 4:30 p.m. will not be returned until the following business day.  For prescription refill requests, have your pharmacy contact our office.       Resources For Cancer Patients and their Caregivers ? American Cancer Society: Can assist with transportation, wigs, general needs, runs Look Good Feel Better.        812-409-5420 ? Cancer Care: Provides financial assistance, online support groups, medication/co-pay assistance.  1-800-813-HOPE 774-832-0666) ? [place] Assists [place] cancer patients and their families through emotional , educational and financial support.  301-601-5181 ? [place] DSS Where to apply for food stamps, Medicaid and utility assistance. 515-214-2977 ? RCATS: Transportation to medical appointments. 8674384034 ? Social Security Administration: May apply for disability if have a Stage IV cancer. 986 288 6707 (587)871-9067 ? [employer], Disability and Transit Services: Assists with nutrition, care and transit needs. [place] Support Programs: @10RELATIVEDAYS @ > Cancer Support Group  2nd Tuesday of the month 1pm-2pm, Journey Room  > Creative Journey  3rd Tuesday of the month 1130am-1pm, Journey Room  > Look Good Feel Better  1st Wednesday of the month 10am-12 noon, Journey Room (Call [place] to register [phone])
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
## 2016-06-06 NOTE — Progress Notes (Signed)
Tolerated first unit PRBC without s/s adverse reaction or complications. Tolerated second unit PRBC without s/s adverse reaction or complications. Stable and ambulatory on discharge home with sister.

## 2016-06-07 ENCOUNTER — Encounter: Payer: Self-pay | Admitting: Dietician

## 2016-06-07 ENCOUNTER — Telehealth: Payer: Self-pay

## 2016-06-07 ENCOUNTER — Ambulatory Visit (HOSPITAL_COMMUNITY): Payer: Medicaid Other

## 2016-06-07 ENCOUNTER — Ambulatory Visit: Payer: Medicaid Other

## 2016-06-07 ENCOUNTER — Ambulatory Visit (HOSPITAL_COMMUNITY): Payer: Medicaid Other | Admitting: Oncology

## 2016-06-07 LAB — TYPE AND SCREEN
ABO/RH(D): B POS
ANTIBODY SCREEN: NEGATIVE
UNIT DIVISION: 0
Unit division: 0

## 2016-06-07 LAB — BPAM RBC
BLOOD PRODUCT EXPIRATION DATE: 201803192359
BLOOD PRODUCT EXPIRATION DATE: 201803222359
ISSUE DATE / TIME: 201802271129
ISSUE DATE / TIME: 201802271320
UNIT TYPE AND RH: 5100
Unit Type and Rh: 1700

## 2016-06-07 NOTE — Telephone Encounter (Signed)
Talked with her to try and get her scheduled for the [place]. She said that she would have to see when she could go and do it. She is going to call me back.

## 2016-06-07 NOTE — Telephone Encounter (Signed)
Noted, no further recommendations. 

## 2016-06-07 NOTE — Progress Notes (Signed)
Patient called RD regarding her tube feeding. She is a H&N cancer pt being followed at [facility]. She has yet to start treatment, secondary to social/transportation issues.  Is receiving TF via PEG.    Wt Readings from Last 10 Encounters:  06/05/16 107 lb (48.5 kg)  06/02/16 101 lb (45.8 kg)  05/31/16 100 lb 4.8 oz (45.5 kg)  05/20/16 108 lb 11 oz (49.3 kg)  05/15/16 103 lb (46.7 kg)  05/11/16 105 lb 12.8 oz (48 kg)  05/10/16 108 lb 11 oz (49.3 kg)  05/09/16 106 lb (48.1 kg)  05/08/16 107 lb 3.2 oz (48.6 kg)  05/02/16 106 lb 8 oz (48.3 kg)   Fortunately, Patient weight appears to be up a remarkable 7 lbs since she was seen 1 week ago, however, this may be slightly exaggerated due to swelling/constipation. Today, on phone, she says she weighed herself most recently as 110 lbs.   She called stating she only has 9 cans of tube feeing left.   Current home TF regimen is as follows:  6 Cans Osmolite 1.2, split into 4 bolus feeds, 1.5 cans each, through PEG. Flush with 30 cc before and after each feed. Total feed volume 416 ml.   She says she has been infusing 4-5 cans of tube feeding/day. Additionally, she is drinking 3 Boost supplements per day. Assuming these are Boost original supplements, she would be receiving  1860-2145 kcals and 82.8-96 g Protein. This alone meets >100% estimated needs. She additionally is eating soft items such as pudding, yogurt, applesauce.   RD praised her for being compliant with TF instructions and eating sufficient Kcals. Encouraged her to continue to continue the same pattern as it sounds that she has made remarkable improvement nutritionally. She denies any tolerance issues  RD called [facility] to see about getting patient delivered more tube feeding cans.   Burtis Junes RD, LDN, [place] [facility] Pager: [phone] 06/07/2016 9:31 AM

## 2016-06-08 ENCOUNTER — Telehealth (HOSPITAL_COMMUNITY): Payer: Self-pay | Admitting: Emergency Medicine

## 2016-06-08 ENCOUNTER — Ambulatory Visit: Payer: Medicaid Other

## 2016-06-08 ENCOUNTER — Other Ambulatory Visit (HOSPITAL_COMMUNITY): Payer: Self-pay | Admitting: Emergency Medicine

## 2016-06-08 DIAGNOSIS — C139 Malignant neoplasm of hypopharynx, unspecified: Secondary | ICD-10-CM

## 2016-06-08 DIAGNOSIS — C321 Malignant neoplasm of supraglottis: Secondary | ICD-10-CM

## 2016-06-08 NOTE — Telephone Encounter (Signed)
Explained [facility] clinic to pt.  Explained she would meet RD, SW, OT and Speech while she was here on 06/20/2016 for about 2 hours.  It will be on the second floor.  I told her I would call her to remind her the day before.

## 2016-06-09 ENCOUNTER — Encounter (HOSPITAL_COMMUNITY): Payer: Self-pay

## 2016-06-09 ENCOUNTER — Encounter: Payer: Self-pay | Admitting: Radiation Oncology

## 2016-06-09 ENCOUNTER — Ambulatory Visit: Payer: Medicaid Other

## 2016-06-09 ENCOUNTER — Encounter (HOSPITAL_COMMUNITY): Payer: Medicaid Other | Attending: Hematology & Oncology

## 2016-06-09 VITALS — BP 114/55 | HR 60 | Temp 98.8°F | Resp 18

## 2016-06-09 DIAGNOSIS — R634 Abnormal weight loss: Secondary | ICD-10-CM | POA: Insufficient documentation

## 2016-06-09 DIAGNOSIS — B370 Candidal stomatitis: Secondary | ICD-10-CM | POA: Insufficient documentation

## 2016-06-09 DIAGNOSIS — Z95828 Presence of other vascular implants and grafts: Secondary | ICD-10-CM | POA: Insufficient documentation

## 2016-06-09 DIAGNOSIS — C139 Malignant neoplasm of hypopharynx, unspecified: Secondary | ICD-10-CM | POA: Insufficient documentation

## 2016-06-09 DIAGNOSIS — C321 Malignant neoplasm of supraglottis: Secondary | ICD-10-CM | POA: Insufficient documentation

## 2016-06-09 DIAGNOSIS — G893 Neoplasm related pain (acute) (chronic): Secondary | ICD-10-CM | POA: Insufficient documentation

## 2016-06-09 MED ORDER — SODIUM CHLORIDE 0.9 % IV SOLN
INTRAVENOUS | Status: DC
  Administered 2016-06-09: 14:00:00 via INTRAVENOUS

## 2016-06-09 MED ORDER — SODIUM CHLORIDE 0.9% FLUSH
10.0000 mL | INTRAVENOUS | Status: DC | PRN
  Administered 2016-06-09: 10 mL via INTRAVENOUS
  Filled 2016-06-09: qty 10

## 2016-06-09 MED ORDER — HEPARIN SOD (PORK) LOCK FLUSH 100 UNIT/ML IV SOLN
500.0000 [IU] | Freq: Once | INTRAVENOUS | Status: AC
  Administered 2016-06-09: 500 [IU] via INTRAVENOUS

## 2016-06-09 NOTE — Progress Notes (Signed)
Tiffany Hale tolerated IV fluids well without complaints or incident. VSS upon discharge. Pt discharged self ambulatory in satisfactory condition accompanied by family member

## 2016-06-09 NOTE — Patient Instructions (Signed)
[facility] at [facility] Discharge Instructions  RECOMMENDATIONS MADE BY THE CONSULTANT AND ANY TEST RESULTS WILL BE SENT TO YOUR REFERRING PHYSICIAN.  Received IV fluids today. Follow-up as scheduled. Call clinic for any questions or concerns  Thank you for choosing [facility] at [facility] to provide your oncology and hematology care.  To afford each patient quality time with our provider, please arrive at least 15 minutes before your scheduled appointment time.    If you have a lab appointment with the Cancer Center please come in thru the  Main Entrance and check in at the main information desk  You need to re-schedule your appointment should you arrive 10 or more minutes late.  We strive to give you quality time with our providers, and arriving late affects you and other patients whose appointments are after yours.  Also, if you no show three or more times for appointments you may be dismissed from the clinic at the providers discretion.     Again, thank you for choosing [facility].  Our hope is that these requests will decrease the amount of time that you wait before being seen by our physicians.       _____________________________________________________________  Should you have questions after your visit to [facility], please contact our office at [phone] between the hours of 8:30 a.m. and 4:30 p.m.  Voicemails left after 4:30 p.m. will not be returned until the following business day.  For prescription refill requests, have your pharmacy contact our office.       Resources For Cancer Patients and their Caregivers ? American Cancer Society: Can assist with transportation, wigs, general needs, runs Look Good Feel Better.        1-888-227-6333 ? Cancer Care: Provides financial assistance, online support groups, medication/co-pay assistance.  1-800-813-HOPE (4673) ? [facility] Assists [place] cancer patients and their families through emotional , educational and financial support.  336-427-4357 ? [place] DSS Where to apply for food stamps, Medicaid and utility assistance. 336-342-1394 ? RCATS: Transportation to medical appointments. 336-347-2287 ? Social Security Administration: May apply for disability if have a Stage IV cancer. 336-342-7796 1-800-772-1213 ? [place] Aging, Disability and Transit Services: Assists with nutrition, care and transit needs. 336-349-2343  Cancer Center Support Programs: @10RELATIVEDAYS@ > Cancer Support Group  2nd Tuesday of the month 1pm-2pm, Journey Room  > Creative Journey  3rd Tuesday of the month 1130am-1pm, Journey Room  > Look Good Feel Better  1st Wednesday of the month 10am-12 noon, Journey Room (Call American Cancer Society to register 1-800-395-5775)   

## 2016-06-12 ENCOUNTER — Encounter (HOSPITAL_BASED_OUTPATIENT_CLINIC_OR_DEPARTMENT_OTHER): Payer: Medicaid Other

## 2016-06-12 ENCOUNTER — Encounter (HOSPITAL_COMMUNITY): Payer: Self-pay

## 2016-06-12 ENCOUNTER — Telehealth: Payer: Self-pay | Admitting: Gastroenterology

## 2016-06-12 ENCOUNTER — Ambulatory Visit: Payer: Medicaid Other

## 2016-06-12 VITALS — BP 133/52 | HR 73 | Temp 98.8°F | Resp 18

## 2016-06-12 DIAGNOSIS — C321 Malignant neoplasm of supraglottis: Secondary | ICD-10-CM

## 2016-06-12 DIAGNOSIS — K5903 Drug induced constipation: Secondary | ICD-10-CM

## 2016-06-12 DIAGNOSIS — Z95828 Presence of other vascular implants and grafts: Secondary | ICD-10-CM

## 2016-06-12 MED ORDER — NALOXEGOL OXALATE 25 MG PO TABS: 25.0000 mg | ORAL_TABLET | Freq: Every day | ORAL | 2 refills | Status: AC

## 2016-06-12 MED ORDER — SODIUM CHLORIDE 0.9 % IV SOLN
INTRAVENOUS | Status: DC
  Administered 2016-06-12: 14:00:00 via INTRAVENOUS

## 2016-06-12 MED ORDER — HEPARIN SOD (PORK) LOCK FLUSH 100 UNIT/ML IV SOLN
500.0000 [IU] | Freq: Once | INTRAVENOUS | Status: AC
  Administered 2016-06-12: 500 [IU] via INTRAVENOUS
  Filled 2016-06-12: qty 5

## 2016-06-12 NOTE — Addendum Note (Signed)
Addended by: Gordy Levan, ERIC A on: 06/12/2016 01:52 PM   Modules accepted: Orders

## 2016-06-12 NOTE — Telephone Encounter (Signed)
PLEASE CALL PATIENT AT [phone] REGARDING HER SAMPLES ERIC GAVE HER

## 2016-06-12 NOTE — Patient Instructions (Signed)
[place] at [facility] Discharge Instructions  RECOMMENDATIONS MADE BY THE CONSULTANT AND ANY TEST RESULTS WILL BE SENT TO YOUR REFERRING PHYSICIAN.  You received IV fluids today. Return as scheduled.   Thank you for choosing [place] at [facility] to provide your oncology and hematology care.  To afford each patient quality time with our provider, please arrive at least 15 minutes before your scheduled appointment time.    If you have a lab appointment with the [facility] please come in thru the  Main Entrance and check in at the main information desk  You need to re-schedule your appointment should you arrive 10 or more minutes late.  We strive to give you quality time with our providers, and arriving late affects you and other patients whose appointments are after yours.  Also, if you no show three or more times for appointments you may be dismissed from the clinic at the providers discretion.     Again, thank you for choosing [facility].  Our hope is that these requests will decrease the amount of time that you wait before being seen by our physicians.       _____________________________________________________________  Should you have questions after your visit to [facility], please contact our office at [phone] between the hours of 8:30 a.m. and 4:30 p.m.  Voicemails left after 4:30 p.m. will not be returned until the following business day.  For prescription refill requests, have your pharmacy contact our office.       Resources For Cancer Patients and their Caregivers ? American Cancer Society: Can assist with transportation, wigs, general needs, runs Look Good Feel Better.        (580)205-5050 ? Cancer Care: Provides financial assistance, online support groups, medication/co-pay assistance.  1-800-813-HOPE 979 794 4801) ? [facility] Assists [place] cancer patients  and their families through emotional , educational and financial support.  520-654-6314 ? [place] DSS Where to apply for food stamps, Medicaid and utility assistance. (308)117-4323 ? RCATS: Transportation to medical appointments. (606)515-0886 ? Social Security Administration: May apply for disability if have a Stage IV cancer. 6072697761 551-003-3467 ? [employer], Disability and Transit Services: Assists with nutrition, care and transit needs. [place] Support Programs: @10RELATIVEDAYS @ > Cancer Support Group  2nd Tuesday of the month 1pm-2pm, Journey Room  > Creative Journey  3rd Tuesday of the month 1130am-1pm, Journey Room  > Look Good Feel Better  1st Wednesday of the month 10am-12 noon, Journey Room (Call [place] to register [phone])

## 2016-06-12 NOTE — Telephone Encounter (Signed)
Ok to leave her a few boxes at the front desk. I will also send in an Rx. If she has Pharmacist, community please provide a copay card along with samples.  If stools are still hard she can also take a colace stool softener once daily as well. Drink plenty of water.

## 2016-06-12 NOTE — Telephone Encounter (Signed)
Pt said to let Walden Field, NP know that the Movantik helped. She is still only have a BM about every other day, and it kinds of hard, but it is better. She would like more samples if you want her to continue. Please advise!

## 2016-06-12 NOTE — Progress Notes (Signed)
Tolerated infusion w/o adverse reaction.  Alert, in no distress.  Discharged ambulatory in c/o family.  

## 2016-06-13 ENCOUNTER — Ambulatory Visit: Payer: Medicaid Other

## 2016-06-13 NOTE — Telephone Encounter (Signed)
Pt is aware of recommendations and Movantik 25 mg #12 at front for her with a coupon for prescription.

## 2016-06-14 ENCOUNTER — Ambulatory Visit: Payer: Medicaid Other

## 2016-06-14 ENCOUNTER — Ambulatory Visit (HOSPITAL_COMMUNITY): Payer: Medicaid Other

## 2016-06-15 ENCOUNTER — Ambulatory Visit: Payer: Medicaid Other

## 2016-06-16 ENCOUNTER — Ambulatory Visit (HOSPITAL_COMMUNITY): Payer: Medicaid Other

## 2016-06-16 ENCOUNTER — Encounter (HOSPITAL_COMMUNITY): Payer: Medicaid Other | Attending: Oncology

## 2016-06-16 ENCOUNTER — Ambulatory Visit: Payer: Medicaid Other

## 2016-06-16 VITALS — BP 115/59 | HR 66 | Temp 99.1°F | Resp 18

## 2016-06-16 DIAGNOSIS — C321 Malignant neoplasm of supraglottis: Secondary | ICD-10-CM | POA: Diagnosis not present

## 2016-06-16 DIAGNOSIS — Z95828 Presence of other vascular implants and grafts: Secondary | ICD-10-CM

## 2016-06-16 DIAGNOSIS — C139 Malignant neoplasm of hypopharynx, unspecified: Secondary | ICD-10-CM | POA: Diagnosis present

## 2016-06-16 LAB — COMPREHENSIVE METABOLIC PANEL
ALBUMIN: 2.7 g/dL — AB (ref 3.5–5.0)
ALT: 24 U/L (ref 14–54)
AST: 20 U/L (ref 15–41)
Alkaline Phosphatase: 52 U/L (ref 38–126)
Anion gap: 6 (ref 5–15)
BUN: 23 mg/dL — AB (ref 6–20)
CHLORIDE: 98 mmol/L — AB (ref 101–111)
CO2: 32 mmol/L (ref 22–32)
CREATININE: 0.44 mg/dL (ref 0.44–1.00)
Calcium: 9.1 mg/dL (ref 8.9–10.3)
GFR calc Af Amer: >60 mL/min (ref >60)
GLUCOSE: 182 mg/dL — AB (ref 65–99)
POTASSIUM: 4 mmol/L (ref 3.5–5.1)
SODIUM: 136 mmol/L (ref 135–145)
Total Bilirubin: 0.2 mg/dL — ABNORMAL LOW (ref 0.3–1.2)
Total Protein: 7 g/dL (ref 6.5–8.1)

## 2016-06-16 LAB — CBC WITH DIFFERENTIAL/PLATELET
BASOS ABS: 0 10*3/uL (ref 0.0–0.1)
BASOS PCT: 0 %
EOS ABS: 0 10*3/uL (ref 0.0–0.7)
Eosinophils Relative: 1 %
HCT: 30.2 % — ABNORMAL LOW (ref 36.0–46.0)
Hemoglobin: 9.6 g/dL — ABNORMAL LOW (ref 12.0–15.0)
LYMPHS PCT: 12 %
Lymphs Abs: 0.6 10*3/uL — ABNORMAL LOW (ref 0.7–4.0)
MCH: 25.8 pg — ABNORMAL LOW (ref 26.0–34.0)
MCHC: 31.8 g/dL (ref 30.0–36.0)
MCV: 81.2 fL (ref 78.0–100.0)
MONO ABS: 0.5 10*3/uL (ref 0.1–1.0)
Monocytes Relative: 9 %
Neutro Abs: 4.1 10*3/uL (ref 1.7–7.7)
Neutrophils Relative %: 79 %
PLATELETS: 112 10*3/uL — AB (ref 150–400)
RBC: 3.72 MIL/uL — AB (ref 3.87–5.11)
RDW: 16.4 % — AB (ref 11.5–15.5)
WBC: 5.2 10*3/uL (ref 4.0–10.5)

## 2016-06-16 MED ORDER — HEPARIN SOD (PORK) LOCK FLUSH 100 UNIT/ML IV SOLN
500.0000 [IU] | Freq: Once | INTRAVENOUS | Status: AC
  Administered 2016-06-16: 500 [IU] via INTRAVENOUS

## 2016-06-16 MED ORDER — SODIUM CHLORIDE 0.9 % IV SOLN
INTRAVENOUS | Status: DC
  Administered 2016-06-16: 13:00:00 via INTRAVENOUS

## 2016-06-16 NOTE — Progress Notes (Signed)
Tolerated hydration fluids well. Stable and ambulatory on discharge home with sister.

## 2016-06-16 NOTE — Patient Instructions (Signed)
[place] at [facility] Discharge Instructions  RECOMMENDATIONS MADE BY THE CONSULTANT AND ANY TEST RESULTS WILL BE SENT TO YOUR REFERRING PHYSICIAN.  Hydration fluids given today as ordered. Return as scheduled.  Thank you for choosing [place] at [facility] to provide your oncology and hematology care.  To afford each patient quality time with our provider, please arrive at least 15 minutes before your scheduled appointment time.    If you have a lab appointment with the [facility] please come in thru the  Main Entrance and check in at the main information desk  You need to re-schedule your appointment should you arrive 10 or more minutes late.  We strive to give you quality time with our providers, and arriving late affects you and other patients whose appointments are after yours.  Also, if you no show three or more times for appointments you may be dismissed from the clinic at the providers discretion.     Again, thank you for choosing [facility].  Our hope is that these requests will decrease the amount of time that you wait before being seen by our physicians.       _____________________________________________________________  Should you have questions after your visit to [facility], please contact our office at [phone] between the hours of 8:30 a.m. and 4:30 p.m.  Voicemails left after 4:30 p.m. will not be returned until the following business day.  For prescription refill requests, have your pharmacy contact our office.       Resources For Cancer Patients and their Caregivers ? American Cancer Society: Can assist with transportation, wigs, general needs, runs Look Good Feel Better.        703-464-0985 ? Cancer Care: Provides financial assistance, online support groups, medication/co-pay assistance.  1-800-813-HOPE 248-698-5279) ? [facility] [facility] Assists [place] cancer  patients and their families through emotional , educational and financial support.  732 373 5915 ? [place] DSS Where to apply for food stamps, Medicaid and utility assistance. 954-869-4004 ? RCATS: Transportation to medical appointments. 706-488-5730 ? Social Security Administration: May apply for disability if have a Stage IV cancer. 314 699 5863 831-449-6349 ? [employer], Disability and Transit Services: Assists with nutrition, care and transit needs. [place] Support Programs: @10RELATIVEDAYS @ > Cancer Support Group  2nd Tuesday of the month 1pm-2pm, Journey Room  > Creative Journey  3rd Tuesday of the month 1130am-1pm, Journey Room  > Look Good Feel Better  1st Wednesday of the month 10am-12 noon, Journey Room (Call [place] to register [phone])

## 2016-06-19 ENCOUNTER — Ambulatory Visit: Payer: Medicaid Other

## 2016-06-19 ENCOUNTER — Ambulatory Visit (HOSPITAL_COMMUNITY): Payer: Medicaid Other

## 2016-06-20 ENCOUNTER — Ambulatory Visit (HOSPITAL_COMMUNITY): Payer: Medicaid Other

## 2016-06-20 ENCOUNTER — Encounter (HOSPITAL_COMMUNITY): Payer: Self-pay

## 2016-06-20 ENCOUNTER — Encounter (HOSPITAL_COMMUNITY): Payer: Self-pay | Admitting: Speech Pathology

## 2016-06-20 ENCOUNTER — Encounter: Payer: Self-pay | Admitting: Dietician

## 2016-06-20 ENCOUNTER — Encounter (HOSPITAL_BASED_OUTPATIENT_CLINIC_OR_DEPARTMENT_OTHER): Payer: Medicaid Other

## 2016-06-20 ENCOUNTER — Encounter: Payer: Self-pay | Admitting: *Deleted

## 2016-06-20 ENCOUNTER — Encounter (HOSPITAL_COMMUNITY): Payer: Medicaid Other

## 2016-06-20 ENCOUNTER — Ambulatory Visit (HOSPITAL_COMMUNITY): Payer: Medicaid Other | Attending: Orthopedic Surgery | Admitting: Speech Pathology

## 2016-06-20 ENCOUNTER — Ambulatory Visit: Payer: Medicaid Other

## 2016-06-20 ENCOUNTER — Ambulatory Visit (HOSPITAL_COMMUNITY)
Admission: RE | Admit: 2016-06-20 | Discharge: 2016-06-20 | Disposition: A | Discharge status: Home / Self Care | Payer: Medicaid Other | Source: Ambulatory Visit | Attending: Oncology | Admitting: Oncology

## 2016-06-20 VITALS — BP 129/60 | HR 73 | Temp 99.1°F | Resp 18

## 2016-06-20 DIAGNOSIS — R1310 Dysphagia, unspecified: Secondary | ICD-10-CM | POA: Insufficient documentation

## 2016-06-20 DIAGNOSIS — R29898 Other symptoms and signs involving the musculoskeletal system: Secondary | ICD-10-CM | POA: Insufficient documentation

## 2016-06-20 DIAGNOSIS — B370 Candidal stomatitis: Secondary | ICD-10-CM | POA: Diagnosis present

## 2016-06-20 DIAGNOSIS — C139 Malignant neoplasm of hypopharynx, unspecified: Secondary | ICD-10-CM | POA: Insufficient documentation

## 2016-06-20 DIAGNOSIS — M542 Cervicalgia: Secondary | ICD-10-CM

## 2016-06-20 DIAGNOSIS — C321 Malignant neoplasm of supraglottis: Secondary | ICD-10-CM | POA: Diagnosis present

## 2016-06-20 DIAGNOSIS — R1312 Dysphagia, oropharyngeal phase: Secondary | ICD-10-CM | POA: Diagnosis present

## 2016-06-20 DIAGNOSIS — Z95828 Presence of other vascular implants and grafts: Secondary | ICD-10-CM | POA: Diagnosis present

## 2016-06-20 DIAGNOSIS — D649 Anemia, unspecified: Secondary | ICD-10-CM

## 2016-06-20 DIAGNOSIS — R634 Abnormal weight loss: Secondary | ICD-10-CM | POA: Diagnosis present

## 2016-06-20 DIAGNOSIS — G893 Neoplasm related pain (acute) (chronic): Secondary | ICD-10-CM | POA: Diagnosis present

## 2016-06-20 DIAGNOSIS — R040 Epistaxis: Secondary | ICD-10-CM

## 2016-06-20 LAB — CBC WITH DIFFERENTIAL/PLATELET
BASOS PCT: 0 %
Basophils Absolute: 0 10*3/uL (ref 0.0–0.1)
EOS ABS: 0 10*3/uL (ref 0.0–0.7)
EOS PCT: 0 %
HCT: 29.4 % — ABNORMAL LOW (ref 36.0–46.0)
HEMOGLOBIN: 8.9 g/dL — AB (ref 12.0–15.0)
Lymphocytes Relative: 9 %
Lymphs Abs: 0.4 10*3/uL — ABNORMAL LOW (ref 0.7–4.0)
MCH: 24.9 pg — ABNORMAL LOW (ref 26.0–34.0)
MCHC: 30.3 g/dL (ref 30.0–36.0)
MCV: 82.1 fL (ref 78.0–100.0)
MONOS PCT: 9 %
Monocytes Absolute: 0.4 10*3/uL (ref 0.1–1.0)
NEUTROS PCT: 81 %
Neutro Abs: 3.8 10*3/uL (ref 1.7–7.7)
PLATELETS: 112 10*3/uL — AB (ref 150–400)
RBC: 3.58 MIL/uL — AB (ref 3.87–5.11)
RDW: 16.2 % — ABNORMAL HIGH (ref 11.5–15.5)
WBC: 4.7 10*3/uL (ref 4.0–10.5)

## 2016-06-20 MED ORDER — HEPARIN SOD (PORK) LOCK FLUSH 100 UNIT/ML IV SOLN
500.0000 [IU] | Freq: Once | INTRAVENOUS | Status: AC
  Administered 2016-06-20: 500 [IU] via INTRAVENOUS

## 2016-06-20 MED ORDER — SODIUM CHLORIDE 0.9 % IV SOLN
INTRAVENOUS | Status: DC
  Administered 2016-06-20: 12:00:00 via INTRAVENOUS

## 2016-06-20 NOTE — Therapy (Signed)
[place], [place], [address] Phone: [phone]   Fax:  [phone]  Modified Barium Swallow  Patient Details  Name: Tiffany Hale MRN: [id number] Date of Birth: 1955-04-18 No Data Recorded  Encounter Date: 06/20/2016      End of Session - 06/20/16 1929    Visit Number 1   Number of Visits 1   Authorization Type Medicaid   SLP Start Time 1165   SLP Stop Time  1140   SLP Time Calculation (min) 55 min   Activity Tolerance Patient tolerated treatment well      Past Medical History:  Diagnosis Date  . Anxiety   . Arthritis    OA left shoulder  . Asthma   . BMI (body mass index) 20.0-29.9 2010 155 LBS  . Chronic back pain   . Cirrhosis ([place]) NOV 2011   MRI LIVER-5 SML DYSPLASTIC NODULES; 2009: TBILI 0.7 ALK PHOS 128 AST 56 ALT 53 ALB 3.8 INR 1.3  . 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 GERD (gastroesophageal reflux disease)   . HBV (hepatitis B virus) infection 2009: HBsAb POS  . HCV (hepatitis C virus) 2009   COMPLETED RX 2015-OCT 2015 VL UNDETECTABLE  . Hepatitis A 2009 Ab POS  . 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 Tobacco abuse     Past Surgical History:  Procedure Laterality Date  . BIOPSY N/A 07/21/2014   Procedure: BIOPSY;  Surgeon: Danie Binder, MD;  Location: [facility];  Service: Endoscopy;  Laterality: N/A;  Gastric  . COLONOSCOPY  OCT 2011    6 MM SIMPLE ADENOMA, BENIGN COLONIC MUCOSA  . ESOPHAGOGASTRODUODENOSCOPY  MAR 2011   Grade 1 varices, GASTRITIS  . ESOPHAGOGASTRODUODENOSCOPY (EGD) WITH PROPOFOL N/A 07/09/2012   SLF:The mucosa of the esophagus appeared normal/Small hiatal hernia/MODERATE Non-erosive gastritis (inflammation) with hemorrhage was found in the gastric antrum; multiple biopsies (reactive gastropathy). No H.pylori. No varices.Next EGD 07/2014.  Marland Kitchen ESOPHAGOGASTRODUODENOSCOPY (EGD) WITH PROPOFOL N/A 07/21/2014   SLF: 1. No esophageal ,  gastric, or duodanl varices seen. 2. Mild non-erosive gastritis.  Marland Kitchen ESOPHAGOGASTRODUODENOSCOPY (EGD) WITH PROPOFOL N/A 11/16/2015   Procedure: ESOPHAGOGASTRODUODENOSCOPY (EGD) WITH PROPOFOL;  Surgeon: Danie Binder, MD;  Location: [facility];  Service: Endoscopy;  Laterality: N/A;  815  . ESOPHAGOGASTRODUODENOSCOPY (EGD) WITH PROPOFOL N/A 05/17/2016   Procedure: ESOPHAGOGASTRODUODENOSCOPY (EGD) WITH PROPOFOL (procedure #2);  Surgeon: Aviva Signs, MD;  Location: [facility];  Service: General;  Laterality: N/A;  . Extremity Surgery     and Pelvic, after fall  . FOOT FRACTURE SURGERY    . FOOT SURGERY Right 2009   [facility]-crushed foot with fall and it was repaired  . LARYNGOSCOPY Left 05/08/2016   Procedure: DIRECT LARYNGOSCOPY WITH LARYNGEAL MASS BIOPSY;  Surgeon: Leta Baptist, MD;  Location: [place];  Service: ENT;  Laterality: Left;  . PEG PLACEMENT N/A 05/17/2016   Procedure: PERCUTANEOUS ENDOSCOPIC GASTROSTOMY (PEG) PLACEMENT (procedure #2);  Surgeon: Aviva Signs, MD;  Location: [facility];  Service: General;  Laterality: N/A;  . PORTACATH PLACEMENT N/A 05/17/2016   Procedure: INSERTION PORT-A-CATH (procedure #1);  Surgeon: Aviva Signs, MD;  Location: [facility];  Service: General;  Laterality: N/A;  . SAVORY DILATION N/A 11/16/2015   Procedure: SAVORY DILATION;  Surgeon: Danie Binder, MD;  Location: [facility];  Service: Endoscopy;  Laterality: N/A;  . SHOULDER ACROMIOPLASTY Left 03/01/2016   Procedure: SHOULDER ACROMIOPLASTY;  Surgeon: Quillian Quince  French Ana, MD;  Location: [place];  Service: Orthopedics;  Laterality: Left;  . SHOULDER ARTHROSCOPY WITH OPEN ROTATOR CUFF REPAIR AND DISTAL CLAVICLE ACROMINECTOMY Left 03/01/2016   Procedure: SHOULDER ARTHROSCOPY WITH SUBACROMIAL DECOMPRESSION and debridement with acromioplasty,OPEN ROTATOR CUFF REPAIR AND DISTAL CLAVICULECTOMY;  Surgeon: Earlie Server, MD;  Location: [place];  Service: Orthopedics;  Laterality: Left;   Pre/Post Op scalene block  . TOTAL ABDOMINAL HYSTERECTOMY  1995 DUB    There were no vitals filed for this visit.      Subjective Assessment - 06/20/16 1839    Subjective "It is difficulty to swallow."   Special Tests MBSS   Currently in Pain? No/denies       CT Neck (04/20/2016): IMPRESSION: 1. Multilobulated mass of the supraglottic larynx, epiglottis and hypopharynx, which involves the epiglottic cartilage and the left aryepiglottic fold and partially fills the vallecula and left piriform sinus. Superiorly, the mass extends along the left aspect of the hypopharynx adjacent to the base of tongue. Invasion into the tongue base would be difficult to exclude. 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 Recommend correlation with direct visualization. 2. Enlarged necrotic left level IIa lymph node, which is in keeping with lymphatic spread of laryngeal squamous cell carcinoma.       General - 06/20/16 1841      General Information   Date of Onset 05/09/16   HPI Tiffany Hale is a 61 year old woman PMH hypopharyngeal squamous cell carcinoma, status post Port-A-Cath placement and PEG tube placement 2/7 with complication of pneumothorax. Initially, Pt was to undergo chemoradiation therapy, however she will now only have radiation therapy with likely palliative intent. She presents with a visible mass on her left neck and reports difficulty and pain with swallowing. MBSS recommended to objectively evaluate swallow. Pt is referred by Dr. Twana First from the [facility].   Type of Study MBS-Modified Barium Swallow Study   Previous Swallow Assessment none on record   Diet Prior to this Study Dysphagia 3 (soft);Thin liquids   Temperature Spikes Noted No   Respiratory Status Room air   History of Recent Intubation No   Behavior/Cognition Alert;Cooperative;Pleasant mood   Oral Cavity Assessment Within Functional Limits  mild white  coating on posterior tongue   Oral Care Completed by SLP No   Oral Cavity - Dentition Dentures, top   Vision Functional for self feeding   Self-Feeding Abilities Able to feed self   Patient Positioning Upright in chair   Baseline Vocal Quality Normal   Volitional Cough Strong   Volitional Swallow Able to elicit   Anatomy Other (Comment)  obvious intrusion of mass in pharyngeal space   Pharyngeal Secretions Not observed secondary MBS            Oral Preparation/Oral Phase - 06/20/16 1901      Oral Preparation/Oral Phase   Oral Phase Impaired     Oral - Solids   Oral - Mechanical Soft Weak ligual manipulation;Imparied mastication;Delayed A-P transit     Electrical stimulation - Oral Phase   Was Electrical Stimulation Used No          Pharyngeal Phase - 06/20/16 1902      Pharyngeal Phase   Pharyngeal Phase Impaired     Pharyngeal - Nectar   Pharyngeal- Nectar Cup Swallow initiation at vallecula;Reduced laryngeal elevation;Reduced airway/laryngeal closure;Penetration/Aspiration during swallow;Pharyngeal residue - valleculae;Pharyngeal residue - pyriform;Lateral channel residue   Pharyngeal Material enters airway, CONTACTS cords and  then ejected out     Pharyngeal - Thin   Pharyngeal- Thin Teaspoon Swallow initiation at pyriform sinus;Penetration/Aspiration during swallow   Pharyngeal Material enters airway, remains ABOVE vocal cords then ejected out   Pharyngeal- Thin Cup Swallow initiation at pyriform sinus;Reduced epiglottic inversion;Reduced laryngeal elevation;Reduced airway/laryngeal closure;Reduced tongue base retraction;Penetration/Aspiration during swallow;Penetration/Apiration after swallow;Trace aspiration;Pharyngeal residue - valleculae   Pharyngeal Material does not enter airway;Material enters airway, remains ABOVE vocal cords then ejected out;Material enters airway, passes BELOW cords without attempt by patient to eject out (silent aspiration);Material enters  airway, passes BELOW cords then ejected out   Pharyngeal- Thin Straw Penetration/Aspiration during swallow;Swallow initiation at pyriform sinus;Reduced epiglottic inversion;Reduced anterior laryngeal mobility;Reduced laryngeal elevation;Reduced airway/laryngeal closure;Trace aspiration;Pharyngeal residue - valleculae;Lateral channel residue;Pharyngeal residue - pyriform   Pharyngeal Material enters airway, passes BELOW cords then ejected out     Pharyngeal - Solids   Pharyngeal- Puree Swallow initiation at vallecula;Reduced epiglottic inversion;Reduced tongue base retraction;Pharyngeal residue - valleculae;Pharyngeal residue - posterior pharnyx;Compensatory strategies attempted (with notebox)  repeat swallows   Pharyngeal- Mechanical Soft Swallow initiation at vallecula;Reduced epiglottic inversion;Reduced tongue base retraction;Pharyngeal residue - valleculae   Pharyngeal- Pill Penetration/Aspiration before swallow   Pharyngeal Material enters airway, passes BELOW cords without attempt by patient to eject out (silent aspiration)  Pt with trace silent aspiration of thins when taking pill     Electrical Stimulation - Pharyngeal Phase   Was Electrical Stimulation Used No          Cricopharyngeal Phase - 06/20/16 1927      Cervical Esophageal Phase   Cervical Esophageal Phase Within functional limits           Plan - 06/20/16 1930    Clinical Impression Statement Pt presents with mild oral phase dysphagia characterized by impaired mastication and prolonged oral transit with solids (pt has upper dentures) and moderate pharyngeal phase dysphagia due to pharyngeal obliteration from tumor which invades epiglottis and surrounding areas. Margins of epiglottis are difficult to visualize. Pt assessed with tsp/cup, straw thin, cup NTL, puree, mechanical soft, and barium tablet with thins. Swallow trigger generally fairly prompt with initiation at the level of the valleculae (at the level of the  pyriforms with straw sips thin). Pt with decreased tongue base retraction and epiglottic deflection due to mass resulting in moderate vallecular residue and mild posterior pharyngeal wall residue with puree and semi-solids. Repeat, dry swallows and liquid was were effective in reducing pharyngeal residue. Pt with one episode of trace, silent aspiration of thins after the swallow from residuals when taking sequential cup sips. Episode of trace, silent aspiration also occurred with straw sips and head turn to the left; Pt with trace, audible aspiration of thins during the swallow when taking barium tablet with thins. Small cup sips eliminated aspiration. Pt also turned to view AP view and demonstrated pooling in Right pyriform after the swallow. Recommend D2/chopped (best with very moist/slippery soft or puree) and thin liquids. No straws. Pt to clear throat/cough periodically after sips of liquids. Medications should be given via PEG ideally or with applesauce if by mouth. SLP reviewed the imaging from swallow study with Pt and provided written recommendations for aspiration precautions and swallow strategies. Pt also encouraged to be vigilant about oral care to reduce risks of developing aspiration PNA. Pt was given my phone number should she have further questions. She has not yet begun radiation therapy, but suspects she will start soon. Pt may need further MBSS/FEES pending her response to radiation therapy.  Treatment/Interventions Aspiration precaution training;SLP instruction and feedback;Patient/family education   Consulted and Agree with Plan of Care Patient;Family member/caregiver   Family Member Consulted Sister      Patient will benefit from skilled therapeutic intervention in order to improve the following deficits and impairments:   Dysphagia, oropharyngeal phase        Recommendations/Treatment - 06/20/16 1927      Swallow Evaluation Recommendations   SLP Diet Recommendations  Dysphagia 2 (chopped);Thin   Liquid Administration via Cup;No straw   Medication Administration Crushed with puree  or via PEG   Supervision Patient able to self feed   Compensations Small sips/bites;Multiple dry swallows after each bite/sip;Follow solids with liquid;Clear throat intermittently   Postural Changes Seated upright at 90 degrees;Remain upright for at least 30 minutes after feeds/meals          Prognosis - 06/20/16 1928      Prognosis   Prognosis for Safe Diet Advancement Guarded   Barriers to Reach Goals Severity of deficits   Barriers/Prognosis Comment Treatment not yet started, tumor growing     Individuals Consulted   Consulted and Agree with Results and Recommendations Patient;Family member/caregiver   Family Member Consulted Sister   Report Sent to  Referring physician      Problem List Patient Active Problem List   Diagnosis Date Noted  . Acute respiratory failure with hypoxia ([place]) 05/24/2016  . Anemia of chronic disease 05/24/2016  . Thrombocytopenia ([place]) 05/24/2016  . Protein-calorie malnutrition, severe 05/20/2016  . Pneumothorax on left 05/19/2016  . Loss of weight 05/11/2016  . 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 Dysphagia 11/09/2015  . IBS (irritable bowel syndrome) 05/12/2015  . Constipation 10/07/2014  . Gastritis 10/07/2014  . Bilateral foot pain 09/09/2014  . Colon adenomas 04/01/2014  . Postmenopausal atrophic vaginitis 07/18/2012  . Dyspareunia 07/18/2012  . 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

## 2016-06-20 NOTE — Progress Notes (Signed)
Patient seen as follow up as part of multidisciplinary clinic.   Has history of H&N cancer. S/p Peg. Not begun treatment yet.   Wt Readings from Last 10 Encounters:  06/05/16 107 lb (48.5 kg)  06/02/16 101 lb (45.8 kg)  05/31/16 100 lb 4.8 oz (45.5 kg)  05/20/16 108 lb 11 oz (49.3 kg)  05/15/16 103 lb (46.7 kg)  05/11/16 105 lb 12.8 oz (48 kg)  05/10/16 108 lb 11 oz (49.3 kg)  05/09/16 106 lb (48.1 kg)  05/08/16 107 lb 3.2 oz (48.6 kg)  05/02/16 106 lb 8 oz (48.3 kg)   Patient today was weighed as 110.4 lbs. This is a further gain of ~3 lbs in the last 2 weeks. Using this wt and ht of 5'  3".5, her BMI is 19.3.   Today, she reports having some issues managing her tube feeding. She says the formula in her tube occasionally backs up during administration and overflows.   She apparently has been unable to infuse more than 1 can at a time. However, she has still been able to infuse 1 can of Osmolite 1.2 5x a day. She infuses 1 syringe of free water before and after eacch can. This is providing her with 1422 kcals, 66 gram protein and 975 cc fluid + 600 cc from flushes.   Additionally, she drinks 3-4 Boost Plus supplements per day. She says that if she has somewhere to be and cannot infuse a can of TF, she will make sure to bring a Boost. 3 of these would be providing her with an addition 1080 kcals and 42g Pro and 555 mls fluid  She ALSO says she still eats some food. She says last night she ate eggs and toast.   Estimated nutritional needs for pt are: 1750-1950 (35-39 kcal/kg) 70-80 g Pro (1.4-1.6 g/kg bw) 1.7-1.9 L fluid  From her TF and Boost intake alone, she is receiving >2500 kcals, >105 g Pro and >2.1 L fluid, well exceeding her estimated needs which clearly explains weight gain.   She is very invested in her nutrition and actually is asking for Prostat. Looking at her labs. Her BUN has been increasing while Creat is stable, potentially due to such high protein intake. RD advised  that, at this time, he is not concerned with her nutrition and thinks she is OK with her current TF regimen. Do not believe she is dehydrated given her reported fluid intake and her report of clear urine.   She does note an increase in pain surrounding her mass. It is "excrusciating when I yawn". She also notes severe headaches later in the day.   Her constipation has been a concern. Today she says she is having a BM daily. She has been on Movantik. She says she has been getting some severe cramping from this.   She notes dysphagia and some times food "gets stuck" in a certain area under her mass. She says she will occasionally have severe coughing fits after eating/drinking. Speech therapy is assessing her today and tentatively is planning to do a MBS.   Nutritionally, the patient has exceeded expectations.   RD will continue to closely monitor and will F/U with speech to see if her oral diet needs to be modified.   Burtis Junes RD, LDN, [facility] Pager: [phone] 06/20/2016 10:53 AM

## 2016-06-20 NOTE — Patient Instructions (Signed)
[facility] Discharge Instructions  RECOMMENDATIONS MADE BY THE CONSULTANT AND ANY TEST RESULTS WILL BE SENT TO YOUR REFERRING PHYSICIAN.  IV fluids today. Return as scheduled.   Thank you for choosing [facility] at [facility] to provide your oncology and hematology care.  To afford each patient quality time with our provider, please arrive at least 15 minutes before your scheduled appointment time.    If you have a lab appointment with the Cancer Center please come in thru the  Main Entrance and check in at the main information desk  You need to re-schedule your appointment should you arrive 10 or more minutes late.  We strive to give you quality time with our providers, and arriving late affects you and other patients whose appointments are after yours.  Also, if you no show three or more times for appointments you may be dismissed from the clinic at the providers discretion.     Again, thank you for choosing  Cancer Center.  Our hope is that these requests will decrease the amount of time that you wait before being seen by our physicians.       _____________________________________________________________  Should you have questions after your visit to  Cancer Center, please contact our office at [phone] between the hours of 8:30 a.m. and 4:30 p.m.  Voicemails left after 4:30 p.m. will not be returned until the following business day.  For prescription refill requests, have your pharmacy contact our office.       Resources For Cancer Patients and their Caregivers ? American Cancer Society: Can assist with transportation, wigs, general needs, runs Look Good Feel Better.        1-888-227-6333 ? Cancer Care: Provides financial assistance, online support groups, medication/co-pay assistance.  1-800-813-HOPE (4673) ? [facility] Assists [place] cancer patients and their  families through emotional , educational and financial support.  336-427-4357 ? [place] DSS Where to apply for food stamps, Medicaid and utility assistance. 336-342-1394 ? [facility]: Transportation to medical appointments. 336-347-2287 ? Social Security Administration: May apply for disability if have a Stage IV cancer. 336-342-7796 1-800-772-1213 ? [place] Aging, Disability and Transit Services: Assists with nutrition, care and transit needs. 336-349-2343  Cancer Center Support Programs: @10RELATIVEDAYS@ > Cancer Support Group  2nd Tuesday of the month 1pm-2pm, Journey Room  > Creative Journey  3rd Tuesday of the month 1130am-1pm, Journey Room  > Look Good Feel Better  1st Wednesday of the month 10am-12 noon, Journey Room (Call American Cancer Society to register 1-800-395-5775)   

## 2016-06-20 NOTE — Progress Notes (Signed)
I briefly saw Tiffany Hale today to see how things were going for her.  She feels her pain is well-controlled on her current pain regimen.   Spoke with Tiffany Hale, SLP who informed me that patient has mild aspiration on examination today.  Tiffany Hale and her sister are asking which of her medications can be crushed and put in her G-tube. Med list printed and reviewed; I marked which medications should not be crushed d/t being extended release.    [name] mentioned that she had 2 significant nosebleeds on Saturday night and Sunday night this past weekend.  We will check CBC with diff today to ensure stability of her counts.   She has already been to [facility] for [facility] sim for radiation; she is awaiting a call from their office for an official start date for her radiation.  I reviewed with Tiffany Hale and her sister that the standard of care for curative intent is chemotherapy with radiation; however given her weakened state, we decided to forgo the chemotherapy portion of her treatment. There is a modest chance of cure with radiation alone; she understands that the intent of her treatment at this point is palliative.   She will continue with twice weekly IV fluids and supportive therapy.    Tiffany Hale voiced understanding of the plan of care. They know to call us with questions.   Mike Craze, NP [place] [phone]

## 2016-06-20 NOTE — Patient Instructions (Addendum)
Patient was instructed today in a home exercise program for cervical range of motion and posture, was educated on the importance of a walking program, and was educated on lymphedema risk and treatment options. The patient was able to verbalize good understanding of each of these.   SHOULDER: Flexion On Table   Place hands on table, elbows straight. Move hips away from body. Press hands down into table. Hold _2-3__ seconds. _10__ reps per set, _1__ sets per day, __7_ days per week  Abduction (Passive)   With arm out to side, resting on table, lower head toward arm, keeping trunk away from table. Hold __2-3__ seconds. Repeat __10__ times. Do __1__ sessions per day.  Copyright  VHI. All rights reserved.     Internal Rotation (Assistive)   Seated with elbow bent at right angle and held against side, slide arm on table surface in an inward arc. Repeat __10__ times. Do __1__ sessions per day. Activity: Use this motion to brush crumbs off the table.  Copyright  VHI. All rights reserved.

## 2016-06-20 NOTE — Progress Notes (Signed)
[facility] Psychosocial Distress Screening Clinical Social Work  Clinical Social Work met with pt at [facility] to check in to reassess needs and to review the distress screening protocol.  The patient scored a 8 on the Psychosocial Distress Thermometer which indicates severe distress. Clinical Social Worker met with pt to assess for distress and other psychosocial needs. Pt continues to have some feelings of anxiety and depression. She continues to take her lexapro and xanax. She feels they are helping, but misses getting out of the house. Pt reports she has had issues with pain and as a result feels it is harder to use her behavioral strategies to work through these concerns. Pt is open to a peer mentor to be able to speak with another H&N patient. CSW will see if this is a possibility. She reports she really needs words of encouragement right now. CSW encouraged pt to attend group or other support program options. CSW shared concerns of pain and possible refills needed for antidepressant with Mike Craze, NP who will follow up with pt at clinic this afternoon. Pt is still interested in completing ADRs/HCPOA and CSW explained CSW could complete on a Tuesday when CSW was at [facility]. Pt will review her schedule and reach out to make an appointment. CSW to follow.   ONCBCN DISTRESS SCREENING 06/20/2016  Screening Type Other (comment)  Distress experienced in past week (1-10) 8  Practical problem type   Emotional problem type Depression;Nervousness/Anxiety;Adjusting to illness;Isolation/feeling alone;Feeling hopeless  Spiritual/Religous concerns type Facing my mortality  Information Concerns Type   Physical Problem type Pain;Bathing/dressing  Physician notified of physical symptoms Yes  Referral to clinical psychology   Referral to clinical social work   Referral to dietition   Referral to financial advocate   Referral to support programs Yes  Referral to palliative care   Other peer mentor referral     Clinical Social Worker follow up needed: Yes.    If yes, follow up plan: See above Ongoing support Peer mentor referral  Loren Racer, [place], [place] Tuesdays   [phone]

## 2016-06-20 NOTE — Progress Notes (Signed)
Tolerated infusion w/o adverse reaction.  Alert, in no distress.  VSS.  Discharged ambulatory in c/o sister.

## 2016-06-20 NOTE — Therapy (Signed)
[place], [place], [address] Phone: [phone]   Fax:  [phone]  Occupational Therapy Neck Cancer clinic Evaluation  Patient Details  Name: Tiffany Hale MRN: [id number] Date of Birth: 03-Jul-1955 Referring Provider: Twana First, MD  Encounter Date: 06/20/2016      OT End of Session - 06/20/16 1122    Visit Number 1   Number of Visits 1   Authorization Type Medicaid   Authorization Time Period Medicaid covers one evaluation from OT per year.   Authorization - Visit Number 1   Authorization - Number of Visits 1   OT Start Time [phone]   OT Stop Time 1023   OT Time Calculation (min) 41 min   Activity Tolerance Patient tolerated treatment well   Behavior During Therapy WFL for tasks assessed/performed      Past Medical History:  Diagnosis Date  . Anxiety   . Arthritis    OA left shoulder  . Asthma   . BMI (body mass index) 20.0-29.9 2010 155 LBS  . Chronic back pain   . Cirrhosis ([place]) NOV 2011   MRI LIVER-5 SML DYSPLASTIC NODULES; 2009: TBILI 0.7 ALK PHOS 128 AST 56 ALT 53 ALB 3.8 INR 1.3  . 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 GERD (gastroesophageal reflux disease)   . HBV (hepatitis B virus) infection 2009: HBsAb POS  . HCV (hepatitis C virus) 2009   COMPLETED RX 2015-OCT 2015 VL UNDETECTABLE  . Hepatitis A 2009 Ab POS  . 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 Tobacco abuse     Past Surgical History:  Procedure Laterality Date  . BIOPSY N/A 07/21/2014   Procedure: BIOPSY;  Surgeon: Danie Binder, MD;  Location: [facility];  Service: Endoscopy;  Laterality: N/A;  Gastric  . COLONOSCOPY  OCT 2011    6 MM SIMPLE ADENOMA, BENIGN COLONIC MUCOSA  . ESOPHAGOGASTRODUODENOSCOPY  MAR 2011   Grade 1 varices, GASTRITIS  . ESOPHAGOGASTRODUODENOSCOPY (EGD) WITH PROPOFOL N/A 07/09/2012   SLF:The mucosa of the esophagus appeared normal/Small hiatal hernia/MODERATE  Non-erosive gastritis (inflammation) with hemorrhage was found in the gastric antrum; multiple biopsies (reactive gastropathy). No H.pylori. No varices.Next EGD 07/2014.  Marland Kitchen ESOPHAGOGASTRODUODENOSCOPY (EGD) WITH PROPOFOL N/A 07/21/2014   SLF: 1. No esophageal , gastric, or duodanl varices seen. 2. Mild non-erosive gastritis.  Marland Kitchen ESOPHAGOGASTRODUODENOSCOPY (EGD) WITH PROPOFOL N/A 11/16/2015   Procedure: ESOPHAGOGASTRODUODENOSCOPY (EGD) WITH PROPOFOL;  Surgeon: Danie Binder, MD;  [facility];  Service: Endoscopy;  Laterality: N/A;  815  . ESOPHAGOGASTRODUODENOSCOPY (EGD) WITH PROPOFOL N/A 05/17/2016   Procedure: ESOPHAGOGASTRODUODENOSCOPY (EGD) WITH PROPOFOL (procedure #2);  Surgeon: Aviva Signs, MD;  Location: [facility];  Service: General;  Laterality: N/A;  . Extremity Surgery     and Pelvic, after fall  . FOOT FRACTURE SURGERY    . FOOT SURGERY Right 2009   [facility]-crushed foot with fall and it was repaired  . LARYNGOSCOPY Left 05/08/2016   Procedure: DIRECT LARYNGOSCOPY WITH LARYNGEAL MASS BIOPSY;  Surgeon: Leta Baptist, MD;  Location: [place];  Service: ENT;  Laterality: Left;  . PEG PLACEMENT N/A 05/17/2016   Procedure: PERCUTANEOUS ENDOSCOPIC GASTROSTOMY (PEG) PLACEMENT (procedure #2);  Surgeon: Aviva Signs, MD;  Location: [facility];  Service: General;  Laterality: N/A;  . PORTACATH PLACEMENT N/A 05/17/2016   Procedure: INSERTION PORT-A-CATH (procedure #1);  Surgeon: Aviva Signs, MD;  Location: [facility];  Service: General;  Laterality: N/A;  .  SAVORY DILATION N/A 11/16/2015   Procedure: SAVORY DILATION;  Surgeon: Danie Binder, MD;  [facility];  Service: Endoscopy;  Laterality: N/A;  . SHOULDER ACROMIOPLASTY Left 03/01/2016   Procedure: SHOULDER ACROMIOPLASTY;  Surgeon: Earlie Server, MD;  Location: [facility];  Service: Orthopedics;  Laterality: Left;  . SHOULDER ARTHROSCOPY WITH OPEN ROTATOR CUFF REPAIR AND DISTAL CLAVICLE ACROMINECTOMY Left 03/01/2016    Procedure: SHOULDER ARTHROSCOPY WITH SUBACROMIAL DECOMPRESSION and debridement with acromioplasty,OPEN ROTATOR CUFF REPAIR AND DISTAL CLAVICULECTOMY;  Surgeon: Earlie Server, MD;  Location: [facility];  Service: Orthopedics;  Laterality: Left;  Pre/Post Op scalene block  . TOTAL ABDOMINAL HYSTERECTOMY  1995 DUB    There were no vitals filed for this visit.      Subjective Assessment - 06/20/16 1105    Subjective  S: I haven't felt well to really do anything.   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 patient has not received chemotherapy or radiation at this time. The plan is to begin radiation. Patient's tumor is fast growing. (T2,N1,M0). patient currently has a PEG tube and is currently using as of 06/20/16.   Patient Stated Goals Learn how to reduce lymphedema risks and cervical ROM exercises.    Currently in Pain? Yes   Pain Score 6    Pain Location Neck  and check/jaw   Pain Orientation Right   Pain Descriptors / Indicators Aching;Sharp   Pain Type Acute pain   Pain Radiating Towards N/A   Pain Onset More than a month ago   Pain Frequency Constant   Aggravating Factors  Unknown   Pain Relieving Factors None   Effect of Pain on Daily Activities patient has difficulty swallowing.   Multiple Pain Sites No           [facility] OT Assessment - 06/20/16 1108      Assessment   Diagnosis Squamous Cell Cancer of Hypopharynx   Referring Provider Twana First, MD   Onset Date 04/20/16   Prior Therapy None for this condition.     Precautions   Precautions Other (comment)  active cancer     Restrictions   Weight Bearing Restrictions No     Balance Screen   Has the patient fallen in the past 6 months No     Home  Environment   Family/patient expects to be discharged to: Private residence   Living Arrangements Other relatives  Sister, Janace Hoard   Additional Comments patient reports that her sister,  Janace Hoard helps with her PEG tube feeding. She does have a home health nurse that comes once a week for wound care and PEG tube care.      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 Prior to her cancer diagnosis, patient was using [employer] 7 times a week.     Mobility   Mobility Status Independent     Written Expression   Dominant Hand Right     Cognition   Overall Cognitive Status Within Functional Limits for tasks assessed     ROM / Strength   AROM / PROM / Strength AROM;Strength     AROM   Overall AROM Comments Cervical range right and left rotation slightly restricted.   AROM Assessment  Site Shoulder   Right/Left Shoulder Left   Left Shoulder Flexion 105 Degrees   Left Shoulder ABduction 77 Degrees   Left Shoulder Internal Rotation 90 Degrees  adducted   Left Shoulder External Rotation 31 Degrees  adducted     Strength   Strength Assessment Site Shoulder   Right/Left Shoulder Left   Left Shoulder Flexion 3-/5   Left Shoulder ABduction 3-/5   Left Shoulder Internal Rotation 3/5   Left Shoulder External Rotation 3-/5          LYMPHEDEMA/ONCOLOGY QUESTIONNAIRE - 06/20/16 1113      Type   Cancer Type Squamous Cell Cancer of Hypopharynx     Surgeries   Other Surgery Date 05/08/16  epiglottis biopsy     Treatment   Active Chemotherapy Treatment No   Past Chemotherapy Treatment No   Active Radiation Treatment No   Past Radiation Treatment No     What other symptoms do you have   Are you having Pain Yes   Do you have infections --     Lymphedema Assessments   Lymphedema Assessments Head and Neck     Head and Neck   4 cm superior to sternal notch around neck 33.5 cm   6 cm superior to sternal notch around neck 32 cm   8 cm superior to sternal notch around neck 32.5 cm   Other  Patient has a visable tumor on the left lateral aspect of her cervical region measuring 9 cm across and 7 cm up and down                       OT Education - 06/20/16 1120    Education provided Yes   Education Details cervical ROM exercises, table slides for shoulder ROM, and risks of lymphedema.    Person(s) Educated Patient   Methods Explanation;Demonstration;Verbal cues;Handout   Comprehension Verbalized understanding;Returned demonstration    Patient was instructed today in a home exercise program for cervical range of motion and posture, was educated on the importance of a walking program, and was educated on lymphedema risk and treatment options. The patient was able to verbalize good understanding of each of these.       OT Short Term Goals - 06/20/16 1126      OT SHORT TERM GOAL #1   Title Patient will be able to verbalize understanding of HEP for cervical and shoulder ROM, posture, and walking.   Time 1   Period Days   Status Achieved     OT SHORT TERM GOAL #2   Title Patient will be able to verablize understanding of proper sitting and standing posture.    Time 1   Period Days   Status Achieved     OT SHORT TERM GOAL #3   Title Patient will be able to verbalize understanding of lymphedema riska nd availability of treatment for this condition.   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 patient has not received chemotherapy or radiation at this time. The plan is to begin radiation. Patient's tumor is fast growing. (T2,N1,M0). patient currently has a PEG tube and is currently using as of 06/20/16. She does have some neck limitations for right and left rotation and complains of  fatigue with all activity. Patient has a history of left RCR and was approved limited visits per Medicaid. patient  continues to have limitations with her left shoulder including pain, decreased strength and ROM. She is currently not exercising and was educated about the importance of starting when she feels a little better. Pt was reminded that treatment is available for lymphedema should it develop.    Rehab Potential Excellent   OT Frequency One time visit   OT Treatment/Interventions Patient/family education   Plan P; Patient is to follow up as needed.    Consulted and Agree with Plan of Care Patient      Patient will benefit from skilled therapeutic intervention in order to improve the following deficits and impairments:  Pain, Decreased range of motion, Decreased knowledge of precautions, Decreased endurance  Visit Diagnosis: Cervicalgia - Plan: Ot plan of care cert/re-cert  Other symptoms and signs involving the musculoskeletal system - Plan: Ot plan of care cert/re-cert  Squamous cell cancer of hypopharynx ([place]) - Plan: Ot plan of care cert/re-cert    Problem List Patient Active Problem List   Diagnosis Date Noted  . Acute respiratory failure with hypoxia ([place]) 05/24/2016  . Anemia of chronic disease 05/24/2016  . Thrombocytopenia ([place]) 05/24/2016  . Protein-calorie malnutrition, severe 05/20/2016  . Pneumothorax on left 05/19/2016  . Loss of weight 05/11/2016  . 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 Dysphagia 11/09/2015  . IBS (irritable bowel syndrome) 05/12/2015  . Constipation 10/07/2014  . Gastritis 10/07/2014  . Bilateral foot pain 09/09/2014  . Colon adenomas 04/01/2014  . Postmenopausal atrophic vaginitis 07/18/2012  . Dyspareunia 07/18/2012  . 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

## 2016-06-21 ENCOUNTER — Ambulatory Visit (HOSPITAL_COMMUNITY): Payer: Medicaid Other

## 2016-06-21 ENCOUNTER — Ambulatory Visit: Payer: Medicaid Other

## 2016-06-21 NOTE — Progress Notes (Signed)
Pt at [facility] clinic 06/20/2016 from 9-11am

## 2016-06-22 ENCOUNTER — Telehealth (HOSPITAL_COMMUNITY): Payer: Self-pay | Admitting: Emergency Medicine

## 2016-06-22 ENCOUNTER — Emergency Department (HOSPITAL_COMMUNITY): Payer: Medicaid Other

## 2016-06-22 ENCOUNTER — Encounter (HOSPITAL_COMMUNITY): Payer: Self-pay | Admitting: Emergency Medicine

## 2016-06-22 ENCOUNTER — Emergency Department (HOSPITAL_COMMUNITY)
Admission: EM | Admit: 2016-06-22 | Discharge: 2016-06-22 | Disposition: A | Discharge status: Home / Self Care | Payer: Medicaid Other | Attending: Emergency Medicine | Admitting: Emergency Medicine

## 2016-06-22 ENCOUNTER — Ambulatory Visit: Payer: Medicaid Other

## 2016-06-22 DIAGNOSIS — Z87891 Personal history of nicotine dependence: Secondary | ICD-10-CM | POA: Insufficient documentation

## 2016-06-22 DIAGNOSIS — R200 Anesthesia of skin: Secondary | ICD-10-CM | POA: Diagnosis not present

## 2016-06-22 DIAGNOSIS — J45909 Unspecified asthma, uncomplicated: Secondary | ICD-10-CM | POA: Diagnosis not present

## 2016-06-22 DIAGNOSIS — R531 Weakness: Secondary | ICD-10-CM | POA: Diagnosis not present

## 2016-06-22 DIAGNOSIS — Z5181 Encounter for therapeutic drug level monitoring: Secondary | ICD-10-CM | POA: Diagnosis not present

## 2016-06-22 DIAGNOSIS — R110 Nausea: Secondary | ICD-10-CM | POA: Insufficient documentation

## 2016-06-22 DIAGNOSIS — R42 Dizziness and giddiness: Secondary | ICD-10-CM | POA: Diagnosis not present

## 2016-06-22 DIAGNOSIS — Z8521 Personal history of malignant neoplasm of larynx: Secondary | ICD-10-CM | POA: Diagnosis not present

## 2016-06-22 DIAGNOSIS — R4182 Altered mental status, unspecified: Secondary | ICD-10-CM

## 2016-06-22 DIAGNOSIS — R410 Disorientation, unspecified: Secondary | ICD-10-CM

## 2016-06-22 DIAGNOSIS — Z79899 Other long term (current) drug therapy: Secondary | ICD-10-CM | POA: Insufficient documentation

## 2016-06-22 LAB — CBC WITH DIFFERENTIAL/PLATELET
Basophils Absolute: 0 10*3/uL (ref 0.0–0.1)
Basophils Relative: 0 %
EOS ABS: 0 10*3/uL (ref 0.0–0.7)
EOS PCT: 0 %
HCT: 30.3 % — ABNORMAL LOW (ref 36.0–46.0)
Hemoglobin: 9.7 g/dL — ABNORMAL LOW (ref 12.0–15.0)
LYMPHS ABS: 0.4 10*3/uL — AB (ref 0.7–4.0)
Lymphocytes Relative: 7 %
MCH: 25.7 pg — AB (ref 26.0–34.0)
MCHC: 32 g/dL (ref 30.0–36.0)
MCV: 80.2 fL (ref 78.0–100.0)
Monocytes Absolute: 0.5 10*3/uL (ref 0.1–1.0)
Monocytes Relative: 10 %
Neutro Abs: 4.5 10*3/uL (ref 1.7–7.7)
Neutrophils Relative %: 84 %
PLATELETS: 107 10*3/uL — AB (ref 150–400)
RBC: 3.78 MIL/uL — AB (ref 3.87–5.11)
RDW: 16.3 % — AB (ref 11.5–15.5)
WBC: 5.4 10*3/uL (ref 4.0–10.5)

## 2016-06-22 LAB — URINALYSIS, ROUTINE W REFLEX MICROSCOPIC
BILIRUBIN URINE: NEGATIVE
GLUCOSE, UA: NEGATIVE mg/dL
HGB URINE DIPSTICK: NEGATIVE
KETONES UR: NEGATIVE mg/dL
Leukocytes, UA: NEGATIVE
NITRITE: NEGATIVE
PH: 7 (ref 5.0–8.0)
Protein, ur: NEGATIVE mg/dL
Specific Gravity, Urine: 1.013 (ref 1.005–1.030)

## 2016-06-22 LAB — COMPREHENSIVE METABOLIC PANEL
ALK PHOS: 62 U/L (ref 38–126)
ALT: 27 U/L (ref 14–54)
AST: 31 U/L (ref 15–41)
Albumin: 2.7 g/dL — ABNORMAL LOW (ref 3.5–5.0)
Anion gap: 6 (ref 5–15)
BILIRUBIN TOTAL: 0.2 mg/dL — AB (ref 0.3–1.2)
BUN: 17 mg/dL (ref 6–20)
CHLORIDE: 94 mmol/L — AB (ref 101–111)
CO2: 32 mmol/L (ref 22–32)
CREATININE: 0.46 mg/dL (ref 0.44–1.00)
Calcium: 8.8 mg/dL — ABNORMAL LOW (ref 8.9–10.3)
GFR calc Af Amer: >60 mL/min (ref >60)
Glucose, Bld: 93 mg/dL (ref 65–99)
Potassium: 3.9 mmol/L (ref 3.5–5.1)
SODIUM: 132 mmol/L — AB (ref 135–145)
TOTAL PROTEIN: 7.2 g/dL (ref 6.5–8.1)

## 2016-06-22 LAB — PROTIME-INR
INR: 1.08
Prothrombin Time: 14 seconds (ref 11.4–15.2)

## 2016-06-22 LAB — I-STAT CG4 LACTIC ACID, ED: Lactic Acid, Venous: 0.87 mmol/L (ref 0.5–1.9)

## 2016-06-22 MED ORDER — SODIUM CHLORIDE 0.9 % IV BOLUS (SEPSIS)
1000.0000 mL | Freq: Once | INTRAVENOUS | Status: AC
  Administered 2016-06-22: 1000 mL via INTRAVENOUS

## 2016-06-22 MED ORDER — IOPAMIDOL (ISOVUE-300) INJECTION 61%
75.0000 mL | Freq: Once | INTRAVENOUS | Status: AC | PRN
  Administered 2016-06-22: 75 mL via INTRAVENOUS

## 2016-06-22 MED ORDER — SODIUM CHLORIDE 0.9 % IV SOLN: INTRAVENOUS | Status: DC

## 2016-06-22 NOTE — Discharge Instructions (Signed)
As discussed, today's evaluation has demonstrated the progression of your left-sided neck mass. Findings were otherwise reassuring.  It is important that you follow-up with your physicians, and radiation care team for further evaluation and management.

## 2016-06-22 NOTE — Telephone Encounter (Signed)
[name] called this morning and said that Tiffany Hale was acting very confused.  They start radiation this morning.  If she is still acting confused I told angie to bring her into the ER to be checked out.  She verbalized understanding.

## 2016-06-22 NOTE — ED Triage Notes (Signed)
Pt has had AMS since Monday about 1530, notice first confusion and this am notice confusion has increased.  Running low grade fever.

## 2016-06-22 NOTE — ED Provider Notes (Signed)
[facility] DEPT Provider Note   CSN: [id number] Arrival date & time: 06/22/16  1321     History   Chief Complaint Chief Complaint  Patient presents with  . Altered Mental Status    for 3 days    HPI Tiffany Hale is a 61 y.o. female.  HPI  Patient presents with 2 family members to assist with the history of present illness. Patient has a known history of neck cancer, has not yet started therapy Now presents due to increasing confusion, weakness, anorexia over the past 3 days. No other clear precipitant. Since onset symptoms of been progressive, with overt disorientation, progressive weakness. Patient tried some details of the history of present illness, but is very slow to respond, has difficult to understand speech. Details corroborated by family members. Patient describes pain in her left lateral neck, sore, nonradiating, severe.   Past Medical History:  Diagnosis Date  . Anxiety   . Arthritis    OA left shoulder  . Asthma   . BMI (body mass index) 20.0-29.9 2010 155 LBS  . Chronic back pain   . Cirrhosis ([place]) NOV 2011   MRI LIVER-5 SML DYSPLASTIC NODULES; 2009: TBILI 0.7 ALK PHOS 128 AST 56 ALT 53 ALB 3.8 INR 1.3  . 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 GERD (gastroesophageal reflux disease)   . HBV (hepatitis B virus) infection 2009: HBsAb POS  . HCV (hepatitis C virus) 2009   COMPLETED RX 2015-OCT 2015 VL UNDETECTABLE  . Hepatitis A 2009 Ab POS  . 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 Tobacco abuse     Patient Active Problem List   Diagnosis Date Noted  . Acute respiratory failure with hypoxia ([place]) 05/24/2016  . Anemia of chronic disease 05/24/2016  . Thrombocytopenia ([place]) 05/24/2016  . Protein-calorie malnutrition, severe 05/20/2016  . Pneumothorax on left 05/19/2016  . Loss of weight 05/11/2016  . 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 Dysphagia 11/09/2015  . IBS (irritable bowel syndrome) 05/12/2015  . Constipation 10/07/2014  . Gastritis 10/07/2014  . Bilateral foot pain 09/09/2014  . Colon adenomas 04/01/2014  . Postmenopausal atrophic vaginitis 07/18/2012  . Dyspareunia 07/18/2012  . 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 BIOPSY N/A 07/21/2014   Procedure: BIOPSY;  Surgeon: Danie Binder, MD;  Location: [facility];  Service: Endoscopy;  Laterality: N/A;  Gastric  . COLONOSCOPY  OCT 2011    6 MM SIMPLE ADENOMA, BENIGN COLONIC MUCOSA  . ESOPHAGOGASTRODUODENOSCOPY  MAR 2011   Grade 1 varices, GASTRITIS  . ESOPHAGOGASTRODUODENOSCOPY (EGD) WITH PROPOFOL N/A 07/09/2012   SLF:The mucosa of the esophagus appeared normal/Small hiatal hernia/MODERATE Non-erosive gastritis (inflammation) with hemorrhage was found in the gastric antrum; multiple biopsies (reactive gastropathy). No H.pylori. No varices.Next EGD 07/2014.  Marland Kitchen ESOPHAGOGASTRODUODENOSCOPY (EGD) WITH PROPOFOL N/A 07/21/2014   SLF: 1. No esophageal , gastric, or duodanl varices seen. 2. Mild non-erosive gastritis.  Marland Kitchen ESOPHAGOGASTRODUODENOSCOPY (EGD) WITH PROPOFOL N/A 11/16/2015   Procedure: ESOPHAGOGASTRODUODENOSCOPY (EGD) WITH PROPOFOL;  Surgeon: Danie Binder, MD;  [facility];  Service: Endoscopy;  Laterality: N/A;  815  . ESOPHAGOGASTRODUODENOSCOPY (EGD) WITH PROPOFOL N/A 05/17/2016   Procedure: ESOPHAGOGASTRODUODENOSCOPY (EGD) WITH PROPOFOL (procedure #2);  Surgeon: Aviva Signs, MD;  Location: [facility];  Service: General;  Laterality: N/A;  . Extremity Surgery  and Pelvic, after fall  . FOOT FRACTURE SURGERY    . FOOT SURGERY Right 2009   [facility]-crushed foot with fall and it was repaired  . LARYNGOSCOPY Left 05/08/2016   Procedure: DIRECT LARYNGOSCOPY WITH LARYNGEAL MASS BIOPSY;  Surgeon: Leta Baptist, MD;  Location: [facility];  Service: ENT;   Laterality: Left;  . PEG PLACEMENT N/A 05/17/2016   Procedure: PERCUTANEOUS ENDOSCOPIC GASTROSTOMY (PEG) PLACEMENT (procedure #2);  Surgeon: Aviva Signs, MD;  Location: [facility];  Service: General;  Laterality: N/A;  . PORTACATH PLACEMENT N/A 05/17/2016   Procedure: INSERTION PORT-A-CATH (procedure #1);  Surgeon: Aviva Signs, MD;  Location: [facility];  Service: General;  Laterality: N/A;  . SAVORY DILATION N/A 11/16/2015   Procedure: SAVORY DILATION;  Surgeon: Danie Binder, MD;  [facility];  Service: Endoscopy;  Laterality: N/A;  . SHOULDER ACROMIOPLASTY Left 03/01/2016   Procedure: SHOULDER ACROMIOPLASTY;  Surgeon: Earlie Server, MD;  Location: [facility];  Service: Orthopedics;  Laterality: Left;  . SHOULDER ARTHROSCOPY WITH OPEN ROTATOR CUFF REPAIR AND DISTAL CLAVICLE ACROMINECTOMY Left 03/01/2016   Procedure: SHOULDER ARTHROSCOPY WITH SUBACROMIAL DECOMPRESSION and debridement with acromioplasty,OPEN ROTATOR CUFF REPAIR AND DISTAL CLAVICULECTOMY;  Surgeon: Earlie Server, MD;  Location: [facility];  Service: Orthopedics;  Laterality: Left;  Pre/Post Op scalene block  . TOTAL ABDOMINAL HYSTERECTOMY  1995 DUB    OB History    No data available       Home Medications    Prior to Admission medications   Medication Sig Start Date End Date Taking? Authorizing Provider  ALPRAZolam Duanne Moron) 0.5 MG tablet Take 1 tablet (0.5 mg total) by mouth every 8 (eight) hours as needed for anxiety or sleep. 06/05/16   Holley Bouche, NP  ARIPiprazole (ABILIFY) 15 MG tablet Take 15 mg by mouth daily.      Historical Provider, MD  dicyclomine (BENTYL) 10 MG capsule Take 1 capsule (10 mg total) by mouth 4 (four) times daily -  before meals and at bedtime. As needed 05/25/16   Samuella Cota, MD  escitalopram (LEXAPRO) 20 MG tablet Take 1.5 tablets (30 mg total) by mouth daily. 1.5 tablet Patient taking differently: Take 40 mg by mouth daily.  05/25/16   Samuella Cota, MD  fluconazole (DIFLUCAN) 100 MG tablet Take 1 tablet (100 mg total) by mouth daily. 05/31/16   Holley Bouche, NP  lubiprostone (AMITIZA) 8 MCG capsule Take 1 capsule (8 mcg total) by mouth 2 (two) times daily with a meal. 05/11/16   Carlis Stable, NP  methylphenidate (CONCERTA) 36 MG CR tablet Take 36 mg by mouth daily.     Historical Provider, MD  morphine (MS CONTIN) 60 MG 12 hr tablet Take 1 tablet (60 mg total) by mouth every 12 (twelve) hours. 06/05/16   Holley Bouche, NP  naloxegol oxalate (MOVANTIK) 25 MG TABS tablet Take 1 tablet (25 mg total) by mouth daily. ONLY TAKE IF STILL TAKING PAIN MEDICATIONS 06/12/16   Carlis Stable, NP  oxybutynin (DITROPAN) 5 MG tablet Take 1 tablet (5 mg total) by mouth 3 (three) times daily. 05/25/16   Samuella Cota, MD  predniSONE (DELTASONE) 20 MG tablet Take 1 tablet (20 mg total) by mouth daily with breakfast. 06/01/16   Holley Bouche, NP  traZODone (DESYREL) 150 MG tablet Take 150 mg by mouth at bedtime.     Historical Provider, MD  zolpidem (AMBIEN) 10 MG tablet Take 10  mg by mouth at bedtime.    Historical Provider, MD    Family History Family History  Problem Relation Age of Onset  . Birth defects Brother   . Cancer Maternal Grandmother   . Cancer Maternal Grandfather   . Cirrhosis Father   . Colon cancer Neg Hx   . Colon polyps Neg Hx     Social History Social History  Substance Use Topics  . Smoking status: Former Smoker    Packs/day: 0.50    Years: 40.00    Types: Cigarettes    Quit date: 05/25/2014  . Smokeless tobacco: Never Used     Comment: 3 days to go through a pack   . Alcohol use No     Comment: history of ETOH abuse, no alcohol X 10 years      Allergies   Aspirin; Codeine; and Nsaids   Review of Systems Review of Systems  Constitutional:       Per HPI, otherwise negative  HENT:       Per HPI, otherwise negative  Respiratory:       Per HPI, otherwise negative  Cardiovascular:       Per HPI,  otherwise negative  Gastrointestinal: Positive for nausea. Negative for abdominal pain and vomiting.  Endocrine:       Negative aside from HPI  Genitourinary:       Neg aside from HPI   Musculoskeletal:       Per HPI, otherwise negative  Skin: Positive for pallor.  Allergic/Immunologic: Positive for immunocompromised state.  Neurological: Positive for dizziness, speech difficulty, weakness and numbness. Negative for syncope.  Psychiatric/Behavioral: The patient is nervous/anxious.      Physical Exam Updated Vital Signs BP (!) 143/72 (BP Location: Right Arm)   Pulse 86   Temp 99.5 F (37.5 C) (Oral)   Resp 20   Ht _0  (1.626 m)   Wt 109 lb (49.4 kg)   SpO2 94%   BMI 18.71 kg/m   Physical Exam  Constitutional: She has a sickly appearance.  HENT:  Head: Normocephalic and atraumatic.    Eyes: Conjunctivae and EOM are normal.  Cardiovascular: Normal rate and regular rhythm.   Pulmonary/Chest: No stridor. No respiratory distress. She has decreased breath sounds in the right upper field, the right middle field and the right lower field.    Abdominal:  Patient has a known PEG tube, denies complication  Musculoskeletal: She exhibits no edema.  Neurological: She is alert. She displays atrophy. She displays no tremor. A cranial nerve deficit is present. She displays no seizure activity.  Patient has difficult to understand speech, is brief, but seems to be appropriate. Patient oriented 3, but slow to move all extremities, although she does move them appropriately.   Skin: Skin is warm and dry.  Psychiatric: Her mood appears anxious. She exhibits a depressed mood.  Nursing note and vitals reviewed.    ED Treatments / Results  Labs (all labs ordered are listed, but only abnormal results are displayed) Labs Reviewed  COMPREHENSIVE METABOLIC PANEL - Abnormal; Notable for the following:       Result Value   Sodium 132 (*)    Chloride 94 (*)    Calcium 8.8 (*)     Albumin 2.7 (*)    Total Bilirubin 0.2 (*)    All other components within normal limits  CBC WITH DIFFERENTIAL/PLATELET - Abnormal; Notable for the following:    RBC 3.78 (*)    Hemoglobin 9.7 (*)  HCT 30.3 (*)    MCH 25.7 (*)    RDW 16.3 (*)    Platelets 107 (*)    Lymphs Abs 0.4 (*)    All other components within normal limits  URINE CULTURE  PROTIME-INR  URINALYSIS, ROUTINE W REFLEX MICROSCOPIC  I-STAT CG4 LACTIC ACID, ED  I-STAT CG4 LACTIC ACID, ED   Review notable for evaluation here 4 weeks ago, during which I saw the patient, she is here for pain management at that point, has not yet started radiation therapy or chemotherapy. Patient's neck mass was not nearly as visible then as it is now.  Since that evaluation the patient has deemed to not be a candidate for chemotherapy. Today, the patient wants to radiation therapy for initial treatment, was referred here given her confusion, weakness.   Radiology Ct Head Wo Contrast  Result Date: 06/22/2016 CLINICAL DATA:  Altered mental status.  History of melanoma EXAM: CT HEAD WITHOUT CONTRAST TECHNIQUE: Contiguous axial images were obtained from the base of the skull through the vertex without intravenous contrast. COMPARISON:  March 02, 2009 FINDINGS: Brain: There is mild diffuse atrophy, stable. There is slightly greater frontal atrophy than elsewhere, stable. There is no intracranial mass, hemorrhage, extra-axial fluid collection, or midline shift. There is slight small vessel disease in the centra semiovale bilaterally. There is evidence of a prior small lacunar infarct in the anterior limb of the right external capsule. Elsewhere gray-white compartments appear normal. No acute infarct evident. Vascular: No hyperdense vessel. There is calcification in each carotid siphon region. Skull: Bony calvarium appears intact. Sinuses/Orbits: There is opacification of ethmoid sinuses in the midportion right ethmoid air cell complex,  essentially stable. Other visualized paranasal sinuses are clear. Visualized orbits appear symmetric bilaterally. Other: Mastoid air cells are clear. IMPRESSION: Stable atrophy with mild periventricular small vessel disease. 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 No intracranial mass, hemorrhage, or extra-axial fluid collection. No acute infarct appreciable. Right ethmoid sinus disease noted. Electronically Signed   By: Lowella Grip III M.D.   On: 06/22/2016 15:13   Ct Soft Tissue Neck W Contrast  Result Date: 06/22/2016 CLINICAL DATA:  Altered mental status.  Supraglottic cancer. EXAM: CT NECK WITH CONTRAST TECHNIQUE: Multidetector CT imaging of the neck was performed using the standard protocol following the bolus administration of intravenous contrast. CONTRAST:  18m ISOVUE-300 IOPAMIDOL (ISOVUE-300) INJECTION 61% COMPARISON:  PET-CT 05/09/2016.  Neck CT 04/20/2016. FINDINGS: Pharynx and larynx: A heterogeneous enhancing mass is again seen with involvement of the left supraglottic larynx, left area epiglottic fold, and epiglottis. The mass involves the left piriform sinus and bilateral vallecula, extending to the tongue base. The mass extends superiorly along the left greater than right oropharyngeal walls. There is increased ulceration of the more superior aspect of the mass on the left, with some retained secretions and punctate calcification or other dense material in this region. Overall, the mass appears slightly larger than on the prior neck CT. The mass abuts the left thyroid cartilage without definite destructive changes of the cartilage or nearby hyoid bone. There is mild narrowing of the lower oropharyngeal and supraglottic airway. Salivary glands: No inflammation, mass, or stone. Thyroid: Punctate calcification in the left thyroid lobe. Lymph nodes: A 7 mm right level IIA lymph node is unchanged in size from the prior neck CT and was not hypermetabolic on the  intervening PET-CT. Necrotic left level II nodal mass has substantially increased in size, now measuring 5.6 x 4.1 cm (3.8  x 3.1 cm on the prior neck CT). This is inseparable from the overlying left sternocleidomastoid muscle. Small left level IIB lymph nodes immediately superior to this mass have increased in size and measure up to 6 mm in short axis (series 7, image 26, previously 3 mm). Left level III and V lymph nodes have slightly increased in size, now measuring up to 7 mm in short axis (series 7, image 66, previously 5 mm). Vascular: The left internal jugular vein is completely effaced at the level of the large nodal mass but appears patent above and below it. There is left greater than right carotid bifurcation calcified atherosclerosis with likely mild narrowing of the proximal left ICA. Limited intracranial: Unremarkable. Visualized orbits: Unremarkable. Mastoids and visualized paranasal sinuses: Mild right ethmoid air cell opacification. Visualized mastoid air cells are clear. Skeleton: Moderate multilevel cervical disc degeneration. No suspicious osseous lesion. Upper chest: Mild centrilobular emphysema and mild upper lobe scarring. Other: None. IMPRESSION: 1. Large supraglottic cancer, overall slightly increased in size and with increased ulceration since 04/20/2016. 2. Worsening left cervical lymphadenopathy. Electronically Signed   By: Logan Bores M.D.   On: 06/22/2016 15:36   Dg Chest Port 1 View  Result Date: 06/22/2016 CLINICAL DATA:  Dizziness and altered mental status. EXAM: PORTABLE CHEST 1 VIEW COMPARISON:  05/25/2016 FINDINGS: Hyperinflation and chronic interstitial coarsening. There is no edema, consolidation, effusion, or pneumothorax. Porta catheter on the left with tip at the SVC level. Foreign body projecting inferior to the reservoir is likely hair tie. EKG leads create artifact over the chest. IMPRESSION: COPD without acute superimposed finding. Electronically Signed   By: Monte Fantasia M.D.   On: 06/22/2016 14:37    Procedures Procedures (including critical care time)  Medications Ordered in ED Medications  sodium chloride 0.9 % bolus 1,000 mL (1,000 mLs Intravenous New Bag/Given 06/22/16 1423)    And  0.9 %  sodium chloride infusion (not administered)  iopamidol (ISOVUE-300) 61 % injection 75 mL (75 mLs Intravenous Contrast Given 06/22/16 1447)     Initial Impression / Assessment and Plan / ED Course  I have reviewed the triage vital signs and the nursing notes.  Pertinent labs & imaging results that were available during my care of the patient were reviewed by me and considered in my medical decision making (see chart for details).  On repeat exam patient appears slightly more awake, interactive. Discussed all findings with her and her companions. Specifically we discussed the apparent progression of her left-sided tumor. In the interval, reviewed the patient's EMR, notable for recent evaluation, discussion of therapeutic options, decision to proceed only with palliative radiation therapy, as the patient is not a candidate for chemotherapy.  I reiterated the likelihood of patient therapy is being a palliative option. With otherwise reassuring findings, no evidence for stroke, no evidence for bacteremia or sepsis, after patient received fluid resuscitation, she was discharged to follow-up with her radiation therapists, primary care team.   Final Clinical Impressions(s) / ED Diagnoses   Final diagnoses:  Altered mental status     Carmin Muskrat, MD 06/22/16 1640

## 2016-06-23 ENCOUNTER — Encounter (HOSPITAL_COMMUNITY): Payer: Medicaid Other | Attending: Adult Health | Admitting: Adult Health

## 2016-06-23 ENCOUNTER — Ambulatory Visit: Payer: Medicaid Other

## 2016-06-23 ENCOUNTER — Encounter (HOSPITAL_COMMUNITY): Payer: Medicaid Other | Attending: Hematology

## 2016-06-23 VITALS — BP 145/70 | HR 100 | Temp 100.0°F | Resp 20 | Wt 108.4 lb

## 2016-06-23 DIAGNOSIS — K5900 Constipation, unspecified: Secondary | ICD-10-CM | POA: Diagnosis not present

## 2016-06-23 DIAGNOSIS — C321 Malignant neoplasm of supraglottis: Secondary | ICD-10-CM

## 2016-06-23 DIAGNOSIS — R4182 Altered mental status, unspecified: Secondary | ICD-10-CM

## 2016-06-23 DIAGNOSIS — G893 Neoplasm related pain (acute) (chronic): Secondary | ICD-10-CM | POA: Diagnosis not present

## 2016-06-23 DIAGNOSIS — E440 Moderate protein-calorie malnutrition: Secondary | ICD-10-CM | POA: Diagnosis not present

## 2016-06-23 DIAGNOSIS — D696 Thrombocytopenia, unspecified: Secondary | ICD-10-CM

## 2016-06-23 DIAGNOSIS — D649 Anemia, unspecified: Secondary | ICD-10-CM

## 2016-06-23 MED ORDER — HEPARIN SOD (PORK) LOCK FLUSH 100 UNIT/ML IV SOLN
500.0000 [IU] | Freq: Once | INTRAVENOUS | Status: AC
Start: 2016-06-23 — End: 2016-06-23
  Administered 2016-06-23: 500 [IU] via INTRAVENOUS

## 2016-06-23 MED ORDER — SODIUM CHLORIDE 0.9 % IV SOLN
INTRAVENOUS | Status: DC
  Administered 2016-06-23: 14:00:00 via INTRAVENOUS

## 2016-06-23 MED ORDER — SODIUM CHLORIDE 0.9% FLUSH
10.0000 mL | INTRAVENOUS | Status: DC | PRN
  Administered 2016-06-23: 10 mL via INTRAVENOUS
  Filled 2016-06-23: qty 10

## 2016-06-23 NOTE — Progress Notes (Signed)
[place] [address], [place] [address]   CLINIC:  Medical Oncology/Hematology  PCP:  [name], NP 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 Multilobulated mass of the supraglottic larynx, epiglottis and hypopharynx, which involves the epiglottic cartilage and the left aryepiglottic fold and partially fills the vallecula and left piriform sinus. Superiorly, the mass extends along the left aspect of the hypopharynx adjacent to the base of tongue. Invasion into the tongue base would be difficult to exclude. 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 Recommend correlation with direct visualization. 2. Enlarged necrotic left level IIa lymph node, which is in keeping with lymphatic spread of laryngeal squamous cell carcinoma.      05/08/2016 Procedure    Direct laryngoscopy and supraglottic mass biopsy by Dr. Benjamine Mola Op Note findings: large supraglottic mass eroding into the epiglottis, aryepiglottis folds, and pyriform sinus. Both vocal cords intact and symmetric.        05/09/2016 Pathology Results    Invasive squamous cell carcinoma, moderately to poorly differentiated.      05/09/2016 PET scan    Hypermetabolic supraglottic mass, compatible with primary head and neck cancer, as described above.  Associated left level IIa cervical nodal metastasis.  No evidence of distal metastasis.      05/17/2016 Procedure    Port & PEG placed by Dr. Arnoldo Morale        INTERVAL HISTORY:  She presents today for follow-up and scheduled IVF; she is accompanied by her sister.   We have been providing supportive care with twice weekly IVF and pain  management.    She was scheduled to start radiation therapy in [place] this week, but when she presented for treatment her mask immobilizer was ill-fitting. Therefore, she had to be rescheduled to begin therapy early next week.    s/p ED visit yesterday on 06/22/16 for altered mental status; her sister states that she was "talking out of her head and wasn't making any sense."  She was formally evaluated in the ED; head CT was done which showed no acute abnormalities; CT neck done which showed progression of left neck mass.  She received 1L NS bolus and was discharged home in stable condition.   Hillery tells me that she feels much better today than she did yesterday.  Her pain remains well-controlled.  She and her sister are concerned that the MS Contin is "getting stuck in the back of the throat."  She has been crushing the medications that could be crushed and putting them in her G-tube.  She and her sister know that long-acting medications should not be crushed.  She is taking the short-acting MSIR about 3-4 times/day for breakthrough pain.    Her bowels are moving well, but reports increased gas/abdominal cramping with the Movantiq.  Her tube feedings are going well.  Her weight is up 8 lbs, which is terrific.  She has occasional left-sided headaches and facial pain, which she attributes to the left neck mass.   Denies fever; does report occasional soaking night sweats.      REVIEW OF SYSTEMS:  Review of Systems  Constitutional: Positive for fatigue. Negative for chills and fever.       (+)  occasional drenching night sweats   HENT:   Positive for lump/mass and trouble swallowing.   Eyes: Negative.   Respiratory: Positive for cough (occasional cough). Negative for shortness of breath.   Cardiovascular: Negative.  Negative for chest pain and palpitations.  Gastrointestinal: Positive for abdominal pain (abd cramping) and constipation. Negative for blood in stool, diarrhea, nausea and vomiting.    Endocrine: Negative.   Genitourinary: Negative.  Negative for dysuria and hematuria.   Musculoskeletal: Positive for neck pain.       Left neck with referred shoulder, left facial, and left headache.   Skin: Negative.   Neurological: Positive for headaches.  Hematological: Positive for adenopathy.  Psychiatric/Behavioral: Positive for depression. The patient is nervous/anxious.      PAST MEDICAL/SURGICAL HISTORY:  Past Medical History:  Diagnosis Date  . Anxiety   . Arthritis    OA left shoulder  . Asthma   . BMI (body mass index) 20.0-29.9 2010 155 LBS  . Chronic back pain   . Cirrhosis ([place]) NOV 2011   MRI LIVER-5 SML DYSPLASTIC NODULES; 2009: TBILI 0.7 ALK PHOS 128 AST 56 ALT 53 ALB 3.8 INR 1.3  . 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 GERD (gastroesophageal reflux disease)   . HBV (hepatitis B virus) infection 2009: HBsAb POS  . HCV (hepatitis C virus) 2009   COMPLETED RX 2015-OCT 2015 VL UNDETECTABLE  . Hepatitis A 2009 Ab POS  . Squamous cell cancer of hypopharynx ([place]) 05/09/2016  . Tobacco abuse    Past Surgical History:  Procedure Laterality Date  . BIOPSY N/A 07/21/2014   Procedure: BIOPSY;  Surgeon: Danie Binder, MD;  Location: [facility];  Service: Endoscopy;  Laterality: N/A;  Gastric  . COLONOSCOPY  OCT 2011    6 MM SIMPLE ADENOMA, BENIGN COLONIC MUCOSA  . ESOPHAGOGASTRODUODENOSCOPY  MAR 2011   Grade 1 varices, GASTRITIS  . ESOPHAGOGASTRODUODENOSCOPY (EGD) WITH PROPOFOL N/A 07/09/2012   SLF:The mucosa of the esophagus appeared normal/Small hiatal hernia/MODERATE Non-erosive gastritis (inflammation) with hemorrhage was found in the gastric antrum; multiple biopsies (reactive gastropathy). No H.pylori. No varices.Next EGD 07/2014.  Marland Kitchen ESOPHAGOGASTRODUODENOSCOPY (EGD) WITH PROPOFOL N/A 07/21/2014   SLF: 1. No esophageal , gastric, or duodanl varices seen. 2. Mild non-erosive gastritis.  Marland Kitchen ESOPHAGOGASTRODUODENOSCOPY (EGD) WITH PROPOFOL N/A 11/16/2015    Procedure: ESOPHAGOGASTRODUODENOSCOPY (EGD) WITH PROPOFOL;  Surgeon: Danie Binder, MD;  [facility];  Service: Endoscopy;  Laterality: N/A;  815  . ESOPHAGOGASTRODUODENOSCOPY (EGD) WITH PROPOFOL N/A 05/17/2016   Procedure: ESOPHAGOGASTRODUODENOSCOPY (EGD) WITH PROPOFOL (procedure #2);  Surgeon: Aviva Signs, MD;  Location: [facility];  Service: General;  Laterality: N/A;  . Extremity Surgery     and Pelvic, after fall  . FOOT FRACTURE SURGERY    . FOOT SURGERY Right 2009   [facility]-crushed foot with fall and it was repaired  . LARYNGOSCOPY Left 05/08/2016   Procedure: DIRECT LARYNGOSCOPY WITH LARYNGEAL MASS BIOPSY;  Surgeon: Leta Baptist, MD;  Location: [facility];  Service: ENT;  Laterality: Left;  . PEG PLACEMENT N/A 05/17/2016   Procedure: PERCUTANEOUS ENDOSCOPIC GASTROSTOMY (PEG) PLACEMENT (procedure #2);  Surgeon: Aviva Signs, MD;  Location: [facility];  Service: General;  Laterality: N/A;  . PORTACATH PLACEMENT N/A 05/17/2016   Procedure: INSERTION PORT-A-CATH (procedure #1);  Surgeon: Aviva Signs, MD;  Location: [facility];  Service: General;  Laterality: N/A;  . SAVORY DILATION N/A 11/16/2015   Procedure: SAVORY DILATION;  Surgeon: Danie Binder, MD;  [facility];  Service: Endoscopy;  Laterality: N/A;  . SHOULDER ACROMIOPLASTY Left 03/01/2016   Procedure: SHOULDER ACROMIOPLASTY;  Surgeon: Earlie Server, MD;  Location: [facility];  Service: Orthopedics;  Laterality: Left;  . SHOULDER ARTHROSCOPY WITH OPEN ROTATOR CUFF REPAIR AND DISTAL CLAVICLE ACROMINECTOMY Left 03/01/2016   Procedure: SHOULDER ARTHROSCOPY WITH SUBACROMIAL DECOMPRESSION and debridement with acromioplasty,OPEN ROTATOR CUFF REPAIR AND DISTAL CLAVICULECTOMY;  Surgeon: Earlie Server, MD;  Location: [facility];  Service: Orthopedics;  Laterality: Left;  Pre/Post Op scalene block  . TOTAL ABDOMINAL HYSTERECTOMY  1995 DUB     SOCIAL HISTORY:  Social History   Social History    . Marital status: Divorced    Spouse name: N/A  . Number of children: N/A  . Years of education: N/A   Occupational History  . Not on file.   Social History Main Topics  . Smoking status: Former Smoker    Packs/day: 0.50    Years: 40.00    Types: Cigarettes    Quit date: 05/25/2014  . Smokeless tobacco: Never Used     Comment: 3 days to go through a pack   . Alcohol use No     Comment: history of ETOH abuse, no alcohol X 10 years   . Drug use: No  . Sexual activity: Not Currently   Other Topics Concern  . Not on file   Social History Narrative  . No narrative on file    FAMILY HISTORY:  Family History  Problem Relation Age of Onset  . Birth defects Brother   . Cancer Maternal Grandmother   . Cancer Maternal Grandfather   . Cirrhosis Father   . Colon cancer Neg Hx   . Colon polyps Neg Hx     CURRENT MEDICATIONS:  Outpatient Encounter Prescriptions as of 06/23/2016  Medication Sig Note  . ALPRAZolam (XANAX) 0.5 MG tablet Take 1 tablet (0.5 mg total) by mouth every 8 (eight) hours as needed for anxiety or sleep.   Marland Kitchen dicyclomine (BENTYL) 10 MG capsule Take 1 capsule (10 mg total) by mouth 4 (four) times daily -  before meals and at bedtime. As needed   . escitalopram (LEXAPRO) 20 MG tablet Take 1.5 tablets (30 mg total) by mouth daily. 1.5 tablet (Patient taking differently: Take 40 mg by mouth daily. )   . fluconazole (DIFLUCAN) 100 MG tablet Take 1 tablet (100 mg total) by mouth daily. (Patient not taking: Reported on 06/22/2016)   . lubiprostone (AMITIZA) 8 MCG capsule Take 1 capsule (8 mcg total) by mouth 2 (two) times daily with a meal. (Patient not taking: Reported on 06/22/2016) 05/11/2016: Just picked up from pharmacy hasn't started yet.   Marland Kitchen morphine (MS CONTIN) 60 MG 12 hr tablet Take 1 tablet (60 mg total) by mouth every 12 (twelve) hours.   Marland Kitchen morphine (MSIR) 30 MG tablet Take 30 mg by mouth every 6 (six) hours as needed for severe pain.   . naloxegol oxalate  (MOVANTIK) 25 MG TABS tablet Take 1 tablet (25 mg total) by mouth daily. ONLY TAKE IF STILL TAKING PAIN MEDICATIONS   . oxybutynin (DITROPAN) 5 MG tablet Take 1 tablet (5 mg total) by mouth 3 (three) times daily. (Patient taking differently: Take 5 mg by mouth 2 (two) times daily. )   . predniSONE (DELTASONE) 20 MG tablet Take 1 tablet (20 mg total) by mouth daily with breakfast. (Patient not taking: Reported on 06/22/2016)   . traZODone (DESYREL) 150 MG  tablet Take 150 mg by mouth at bedtime.     Facility-Administered Encounter Medications as of 06/23/2016  Medication  . 0.9 %  sodium chloride infusion  . feeding supplement (OSMOLITE 1.2 CAL) liquid 474 mL  . heparin lock flush 100 unit/mL  . sodium chloride flush (NS) 0.9 % injection 10 mL    ALLERGIES:  Allergies  Allergen Reactions  . Aspirin Nausea And Vomiting  . Codeine Other (See Comments)    Nervousness  . Nsaids Nausea And Vomiting     PHYSICAL EXAM:  ECOG Performance status: 2 - Symptomatic, frail; requires occasional assistance.       Physical Exam  Constitutional: No distress.  Thin, frail, cachetic   HENT:  Head: Normocephalic.  Thrush resolved  Eyes: Conjunctivae are normal. No scleral icterus.  Cardiovascular: Normal rate, regular rhythm and normal heart sounds.   Pulmonary/Chest: Effort normal. No respiratory distress. She has no wheezes.  Breath sounds diminished to bilat bases.   Abdominal: Soft. Bowel sounds are normal. There is no tenderness.  G-tube in place  Musculoskeletal: Normal range of motion. She exhibits no edema.  Lymphadenopathy:    She has cervical adenopathy (Marked enlargement of (L) neck mass ).  Neurological: She is alert. She is disoriented (mild disorientation appreciated (she was confused by her G-tube vs port-a-cath lines). ). Gait normal.  Slow to respond to some questions; speech slow.   Skin: Skin is warm and dry. No rash noted.  Psychiatric: Mood normal.     Nursing note and  vitals reviewed.   LABORATORY DATA:  I have reviewed the labs as listed.  CBC    Component Value Date/Time   WBC 5.4 06/22/2016 1340   RBC 3.78 (L) 06/22/2016 1340   HGB 9.7 (L) 06/22/2016 1340   HCT 30.3 (L) 06/22/2016 1340   PLT 107 (L) 06/22/2016 1340   MCV 80.2 06/22/2016 1340   MCH 25.7 (L) 06/22/2016 1340   MCHC 32.0 06/22/2016 1340   RDW 16.3 (H) 06/22/2016 1340   LYMPHSABS 0.4 (L) 06/22/2016 1340   MONOABS 0.5 06/22/2016 1340   EOSABS 0.0 06/22/2016 1340   BASOSABS 0.0 06/22/2016 1340   CMP Latest Ref Rng & Units 06/22/2016 06/16/2016 06/05/2016  Glucose 65 - 99 mg/dL 93 182(H) 197(H)  BUN 6 - 20 mg/dL 17 23(H) 15  Creatinine 0.44 - 1.00 mg/dL 0.46 0.44 0.50  Sodium 135 - 145 mmol/L 132(L) 136 133(L)  Potassium 3.5 - 5.1 mmol/L 3.9 4.0 4.2  Chloride 101 - 111 mmol/L 94(L) 98(L) 96(L)  CO2 22 - 32 mmol/L 32 32 31  Calcium 8.9 - 10.3 mg/dL 8.8(L) 9.1 9.0  Total Protein 6.5 - 8.1 g/dL 7.2 7.0 7.1  Total Bilirubin 0.3 - 1.2 mg/dL 0.2(L) 0.2(L) 0.1(L)  Alkaline Phos 38 - 126 U/L 62 52 51  AST 15 - 41 U/L 31 20 33  ALT 14 - 54 U/L 27 24 35    PENDING LABS:    DIAGNOSTIC IMAGING:  PET scan: 05/09/16    CT neck: 06/22/16       PATHOLOGY:  Left epiglottis biopsy: 05/08/16    ASSESSMENT & PLAN:   Stage IVA squamous cell carcinoma of epiglottis:  -Treatment to begin with palliative intent with radiation alone, with possible chance at cure.  -Given Ms. Marian' frail state, it was determined with Dr. Talbert Cage that concurrent chemoradiation is not recommended. Therefore, we are proceeding with radiation alone.  I reinforced these recommendations again today with the patient and her  family.  -Recent CT neck completed in ED on 06/22/16 showed increase in (L) neck level 2 nodal mass; now measuring 5.6 x 4.1 cm (previously 3.8 x 3.1 cm). Several other left cervical LNs have also increased in size. The epiglottic mass is also larger with increased ulceration.  -Starting  her radiation therapy was delayed because her immobilization mask no longer fit. Her mask is being re-fitted and she should begin radiation ASAP.   Neoplasm related pain:  -Pain currently well-controlled with MS Contin 60 mg Q12H and MSIR 30 mg PRN.  -Patient expressed concerned regarding associated dysphagia with swallowing MS Contin. (short-acting meds are being crushed and put in G-tube). She has been taking the MS Contin with water. I recommended she try to take the pill with applesauce or pudding to see if she may help her symptoms. We also discussed chin tucking to help prevent aspiration.  -I am hesitant to change her current pain regimen because it is controlling her pain so well.  The Fentanyl patch may not be effective given her limited fat for subQ absorption for transdermal patch.  She knows to let me know if her pain worsens or if she is unable to swallow the MS Contin with the above strategies. Could consider more frequent use of MSIR or Roxanol liquid, if needed.   At risk for aspiration:  -Speech therapy evaluation done and patient has documented periods of mild aspiration per SLP.  -I recommended patient use modified swallowing techniques, including tucking chin, when swallowing her long-acting medications.   Mild altered mental status:  -Periods of AMS noted during today's evaluation. Recent CT head without contrast completed in ED yesterday showed no acute abnormalities.  Could be secondary to malnutrition vs vessel occlusion secondary to left neck mass.    Oral candidiasis:  -Resolved.     Social issues:  -Her sister is her primary caregiver and has been consistent in bringing patient to scheduled appointments and adhering to medication recommendations for patient.   Protein-calorie malnutrition:  -Clinically improving. She has gained ~8 lbs in the past month, which is very encouraging.  I commended her efforts with regards to her tube feedings.  She understands the critical  role her nutrition plays in her cancer treatments.  -Albumin remains quite low at 2.7 today.   Burtis Junes, RD is continuing to follow her as well.  -We will continue twice weekly IVF here at the cancer center for supportive care.   Constipation/Increased abdominal cramping:  -Currently on Movantiq, as recommended by GI.  -Patient with small daily BMs, but having increased gas/abdominal cramping as a result.  Patient's sister mentioned that she had been "venting the G-tube to try to help."  My concern with venting the G-tube at home is that more air is being introduced into the gut contributing to her symptoms.  Encouraged patient/sister to try to limit the amount of air getting into G-tube to see if this may help decrease her abdominal cramping/gas symptoms.  -OTC Miralax may be helpful as well for further constipation.   Anemia/Thrombocytopenia:  -Most recent hemoglobin 9.7; likely secondary to malignancy. No need for transfusion at this time; we will continue to monitor.     Dispo:  -Radiation to start ASAP.  -Continue twice weekly IVF at cancer center for supportive care.  -I will follow-up with patient within the next few weeks when she is here for IVF.    All questions were answered to patient's stated satisfaction. Encouraged patient to call with  any new concerns or questions before her next visit to the cancer center and we can certain see her sooner, if needed.       Orders placed this encounter:  No orders of the defined types were placed in this encounter.     Mike Craze, NP [place] [phone]

## 2016-06-23 NOTE — Patient Instructions (Signed)
[place] at [facility] Discharge Instructions  RECOMMENDATIONS MADE BY THE CONSULTANT AND ANY TEST RESULTS WILL BE SENT TO YOUR REFERRING PHYSICIAN.  You saw Tiffany Craze NP today Return as scheduled for fluids  Return to see the doctor on 07/13/2016   Thank you for choosing [place] at [facility] to provide your oncology and hematology care.  To afford each patient quality time with our provider, please arrive at least 15 minutes before your scheduled appointment time.    If you have a lab appointment with the [facility] please come in thru the  Main Entrance and check in at the main information desk  You need to re-schedule your appointment should you arrive 10 or more minutes late.  We strive to give you quality time with our providers, and arriving late affects you and other patients whose appointments are after yours.  Also, if you no show three or more times for appointments you may be dismissed from the clinic at the providers discretion.     Again, thank you for choosing [facility].  Our hope is that these requests will decrease the amount of time that you wait before being seen by our physicians.       _____________________________________________________________  Should you have questions after your visit to [facility], please contact our office at [phone] between the hours of 8:30 a.m. and 4:30 p.m.  Voicemails left after 4:30 p.m. will not be returned until the following business day.  For prescription refill requests, have your pharmacy contact our office.       Resources For Cancer Patients and their Caregivers ? American Cancer Society: Can assist with transportation, wigs, general needs, runs Look Good Feel Better.        9176817533 ? Cancer Care: Provides financial assistance, online support groups, medication/co-pay assistance.  1-800-813-HOPE 5411095110) ? [place] Assists [place] cancer patients and their families through emotional , educational and financial support.  346-219-7715 ? [place] DSS Where to apply for food stamps, Medicaid and utility assistance. 316 019 5180 ? [employer]: Transportation to medical appointments. (813)595-8462 ? Social Security Administration: May apply for disability if have a Stage IV cancer. 5345689693 857-135-0114 ? [employer], Disability and Transit Services: Assists with nutrition, care and transit needs. [place] Support Programs: @10RELATIVEDAYS @ > Cancer Support Group  2nd Tuesday of the month 1pm-2pm, Journey Room  > Creative Journey  3rd Tuesday of the month 1130am-1pm, Journey Room  > Look Good Feel Better  1st Wednesday of the month 10am-12 noon, Journey Room (Call [place] to register [phone])

## 2016-06-23 NOTE — Patient Instructions (Signed)
[place] at [facility] Discharge Instructions  RECOMMENDATIONS MADE BY THE CONSULTANT AND ANY TEST RESULTS WILL BE SENT TO YOUR REFERRING PHYSICIAN.  Received IV fluids today. Follow-up as scheduled. Call clinic for any questions or concerns  Thank you for choosing [place] at [facility] to provide your oncology and hematology care.  To afford each patient quality time with our provider, please arrive at least 15 minutes before your scheduled appointment time.    If you have a lab appointment with the [facility] please come in thru the  Main Entrance and check in at the main information desk  You need to re-schedule your appointment should you arrive 10 or more minutes late.  We strive to give you quality time with our providers, and arriving late affects you and other patients whose appointments are after yours.  Also, if you no show three or more times for appointments you may be dismissed from the clinic at the providers discretion.     Again, thank you for choosing [facility].  Our hope is that these requests will decrease the amount of time that you wait before being seen by our physicians.       _____________________________________________________________  Should you have questions after your visit to [facility], please contact our office at [phone] between the hours of 8:30 a.m. and 4:30 p.m.  Voicemails left after 4:30 p.m. will not be returned until the following business day.  For prescription refill requests, have your pharmacy contact our office.       Resources For Cancer Patients and their Caregivers ? American Cancer Society: Can assist with transportation, wigs, general needs, runs Look Good Feel Better.        754-562-2356 ? Cancer Care: Provides financial assistance, online support groups, medication/co-pay assistance.  1-800-813-HOPE 901-764-8081) ? [place] Assists [place] cancer patients and their families through emotional , educational and financial support.  873-534-9747 ? [place] DSS Where to apply for food stamps, Medicaid and utility assistance. 305-276-6848 ? RCATS: Transportation to medical appointments. (414) 833-1571 ? Social Security Administration: May apply for disability if have a Stage IV cancer. (949) 348-0443 4037857661 ? [employer], Disability and Transit Services: Assists with nutrition, care and transit needs. [place] Support Programs: @10RELATIVEDAYS @ > Cancer Support Group  2nd Tuesday of the month 1pm-2pm, Journey Room  > Creative Journey  3rd Tuesday of the month 1130am-1pm, Journey Room  > Look Good Feel Better  1st Wednesday of the month 10am-12 noon, Journey Room (Call [place] to register [phone])

## 2016-06-23 NOTE — Progress Notes (Signed)
Tiffany Hale tolerated IV fluids well without complaints or incident. Pt discharged self ambulatory in satisfactory condition accompanied by a family member

## 2016-06-24 LAB — URINE CULTURE
CULTURE: NO GROWTH
SPECIAL REQUESTS: NORMAL

## 2016-06-25 ENCOUNTER — Emergency Department (HOSPITAL_COMMUNITY)
Admission: EM | Admit: 2016-06-25 | Discharge: 2016-06-25 | Disposition: A | Discharge status: Home / Self Care | Payer: Medicaid Other | Attending: Emergency Medicine | Admitting: Emergency Medicine

## 2016-06-25 ENCOUNTER — Encounter (HOSPITAL_COMMUNITY): Payer: Self-pay | Admitting: Emergency Medicine

## 2016-06-25 ENCOUNTER — Emergency Department (HOSPITAL_COMMUNITY): Payer: Medicaid Other

## 2016-06-25 DIAGNOSIS — R410 Disorientation, unspecified: Secondary | ICD-10-CM | POA: Diagnosis not present

## 2016-06-25 DIAGNOSIS — Z79899 Other long term (current) drug therapy: Secondary | ICD-10-CM | POA: Diagnosis not present

## 2016-06-25 DIAGNOSIS — C760 Malignant neoplasm of head, face and neck: Secondary | ICD-10-CM | POA: Diagnosis not present

## 2016-06-25 DIAGNOSIS — J45909 Unspecified asthma, uncomplicated: Secondary | ICD-10-CM | POA: Diagnosis not present

## 2016-06-25 DIAGNOSIS — Z87891 Personal history of nicotine dependence: Secondary | ICD-10-CM | POA: Diagnosis not present

## 2016-06-25 LAB — COMPREHENSIVE METABOLIC PANEL
ALBUMIN: 2.6 g/dL — AB (ref 3.5–5.0)
ALK PHOS: 58 U/L (ref 38–126)
ALT: 24 U/L (ref 14–54)
AST: 24 U/L (ref 15–41)
Anion gap: 5 (ref 5–15)
BILIRUBIN TOTAL: 0.3 mg/dL (ref 0.3–1.2)
BUN: 14 mg/dL (ref 6–20)
CALCIUM: 9.3 mg/dL (ref 8.9–10.3)
CO2: 35 mmol/L — ABNORMAL HIGH (ref 22–32)
Chloride: 96 mmol/L — ABNORMAL LOW (ref 101–111)
Creatinine, Ser: 0.4 mg/dL — ABNORMAL LOW (ref 0.44–1.00)
GFR calc Af Amer: >60 mL/min (ref >60)
GLUCOSE: 103 mg/dL — AB (ref 65–99)
Potassium: 4.2 mmol/L (ref 3.5–5.1)
Sodium: 136 mmol/L (ref 135–145)
TOTAL PROTEIN: 7 g/dL (ref 6.5–8.1)

## 2016-06-25 LAB — CBC WITH DIFFERENTIAL/PLATELET
BASOS ABS: 0 10*3/uL (ref 0.0–0.1)
BASOS PCT: 0 %
EOS PCT: 1 %
Eosinophils Absolute: 0.1 10*3/uL (ref 0.0–0.7)
HCT: 29.8 % — ABNORMAL LOW (ref 36.0–46.0)
Hemoglobin: 9.5 g/dL — ABNORMAL LOW (ref 12.0–15.0)
Lymphocytes Relative: 13 %
Lymphs Abs: 0.6 10*3/uL — ABNORMAL LOW (ref 0.7–4.0)
MCH: 25.5 pg — ABNORMAL LOW (ref 26.0–34.0)
MCHC: 31.9 g/dL (ref 30.0–36.0)
MCV: 80.1 fL (ref 78.0–100.0)
MONO ABS: 0.6 10*3/uL (ref 0.1–1.0)
Monocytes Relative: 14 %
Neutro Abs: 3.2 10*3/uL (ref 1.7–7.7)
Neutrophils Relative %: 73 %
PLATELETS: 116 10*3/uL — AB (ref 150–400)
RBC: 3.72 MIL/uL — ABNORMAL LOW (ref 3.87–5.11)
RDW: 16.1 % — AB (ref 11.5–15.5)
WBC: 4.5 10*3/uL (ref 4.0–10.5)

## 2016-06-25 LAB — URINALYSIS, ROUTINE W REFLEX MICROSCOPIC
BILIRUBIN URINE: NEGATIVE
Glucose, UA: NEGATIVE mg/dL
Hgb urine dipstick: NEGATIVE
KETONES UR: NEGATIVE mg/dL
Leukocytes, UA: NEGATIVE
NITRITE: NEGATIVE
Protein, ur: NEGATIVE mg/dL
Specific Gravity, Urine: 1.012 (ref 1.005–1.030)
pH: 7 (ref 5.0–8.0)

## 2016-06-25 LAB — AMMONIA: AMMONIA: 25 umol/L (ref 9–35)

## 2016-06-25 MED ORDER — SODIUM CHLORIDE 0.9 % IV BOLUS (SEPSIS)
500.0000 mL | Freq: Once | INTRAVENOUS | Status: AC
  Administered 2016-06-25: 500 mL via INTRAVENOUS

## 2016-06-25 MED ORDER — MORPHINE SULFATE (PF) 10 MG/ML IV SOLN
10.0000 mg | Freq: Once | INTRAVENOUS | Status: AC
Start: 2016-06-25 — End: 2016-06-25
  Administered 2016-06-25: 10 mg via INTRAVENOUS
  Filled 2016-06-25: qty 1

## 2016-06-25 NOTE — ED Triage Notes (Signed)
Pt od cancer center- here 3/15 for same  Pt is full code

## 2016-06-25 NOTE — ED Notes (Signed)
Pt mother reports pt has been altered for a few days.  Pt has been receiving tube feedings, has been confused, taking pain medication with relief.  Mucus membranes dry, throat slightly red.  Pt is alert at this time.

## 2016-06-25 NOTE — ED Notes (Signed)
Dr Pickering at bedside 

## 2016-06-25 NOTE — ED Notes (Addendum)
Pt made aware to return if symptoms worsen or if any life threatening symptoms occur.  Dr. Alvino Chapel okay with pt temp 99.6.

## 2016-06-25 NOTE — ED Provider Notes (Signed)
[facility] DEPT Provider Note   CSN: [id number] Arrival date & time: 06/25/16  1409     History   Chief Complaint Chief Complaint  Patient presents with  . Altered Mental Status    Cancer pt with hx of same    HPI Tiffany Hale is a 61 y.o. female.  HPI Patient presents with confusion. Has been reportedly going on for a few days. Seen 5 days ago in the ER for the same. Has known left-sided neck cancer. Due to have palliative radiation but has not started yet because the mask did not fit. She also is on chronic pain medicines. She takes only her pain meds orally and everything else comes through her G-tube. Her mouth is somewhat dry. Slight erythema of throat. States she just feels bad. Has reportedly had low-grade fevers. Has left-sided head and neck pain which is typical for her with the tumor. Awake and appropriate but somewhat slow to answer. Patient lives with her mother who really cannot provide much history.   Past Medical History:  Diagnosis Date  . Anxiety   . Arthritis    OA left shoulder  . Asthma   . BMI (body mass index) 20.0-29.9 2010 155 LBS  . Chronic back pain   . Cirrhosis ([place]) NOV 2011   MRI LIVER-5 SML DYSPLASTIC NODULES; 2009: TBILI 0.7 ALK PHOS 128 AST 56 ALT 53 ALB 3.8 INR 1.3  . 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 GERD (gastroesophageal reflux disease)   . HBV (hepatitis B virus) infection 2009: HBsAb POS  . HCV (hepatitis C virus) 2009   COMPLETED RX 2015-OCT 2015 VL UNDETECTABLE  . Hepatitis A 2009 Ab POS  . 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 Tobacco abuse     Patient Active Problem List   Diagnosis Date Noted  . Acute respiratory failure with hypoxia ([place]) 05/24/2016  . Anemia of chronic disease 05/24/2016  . Thrombocytopenia ([place]) 05/24/2016  . Protein-calorie malnutrition, severe 05/20/2016  . Pneumothorax on left 05/19/2016  . Loss of weight 05/11/2016  . 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 Dysphagia 11/09/2015  . IBS (irritable bowel syndrome) 05/12/2015  . Constipation 10/07/2014  . Gastritis 10/07/2014  . Bilateral foot pain 09/09/2014  . Colon adenomas 04/01/2014  . Postmenopausal atrophic vaginitis 07/18/2012  . Dyspareunia 07/18/2012  . 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 BIOPSY N/A 07/21/2014   Procedure: BIOPSY;  Surgeon: Danie Binder, MD;  Location: [facility];  Service: Endoscopy;  Laterality: N/A;  Gastric  . COLONOSCOPY  OCT 2011    6 MM SIMPLE ADENOMA, BENIGN COLONIC MUCOSA  . ESOPHAGOGASTRODUODENOSCOPY  MAR 2011   Grade 1 varices, GASTRITIS  . ESOPHAGOGASTRODUODENOSCOPY (EGD) WITH PROPOFOL N/A 07/09/2012   SLF:The mucosa of the esophagus appeared normal/Small hiatal hernia/MODERATE Non-erosive gastritis (inflammation) with hemorrhage was found in the gastric antrum; multiple biopsies (reactive gastropathy). No H.pylori. No varices.Next EGD 07/2014.  Marland Kitchen ESOPHAGOGASTRODUODENOSCOPY (EGD) WITH PROPOFOL N/A 07/21/2014   SLF: 1. No esophageal , gastric, or duodanl varices seen. 2. Mild non-erosive gastritis.  Marland Kitchen ESOPHAGOGASTRODUODENOSCOPY (EGD) WITH PROPOFOL N/A 11/16/2015   Procedure: ESOPHAGOGASTRODUODENOSCOPY (EGD) WITH PROPOFOL;  Surgeon: Danie Binder, MD;  [facility];  Service: Endoscopy;  Laterality: N/A;  815  . ESOPHAGOGASTRODUODENOSCOPY (EGD) WITH PROPOFOL N/A 05/17/2016  Procedure: ESOPHAGOGASTRODUODENOSCOPY (EGD) WITH PROPOFOL (procedure #2);  Surgeon: Aviva Signs, MD;  Location: [facility];  Service: General;  Laterality: N/A;  . Extremity Surgery     and Pelvic, after fall  . FOOT FRACTURE SURGERY    . FOOT SURGERY Right 2009   [facility]-crushed foot with fall and it was repaired  . LARYNGOSCOPY Left 05/08/2016   Procedure: DIRECT LARYNGOSCOPY WITH LARYNGEAL MASS BIOPSY;   Surgeon: Leta Baptist, MD;  Location: [facility];  Service: ENT;  Laterality: Left;  . PEG PLACEMENT N/A 05/17/2016   Procedure: PERCUTANEOUS ENDOSCOPIC GASTROSTOMY (PEG) PLACEMENT (procedure #2);  Surgeon: Aviva Signs, MD;  Location: [facility];  Service: General;  Laterality: N/A;  . PORTACATH PLACEMENT N/A 05/17/2016   Procedure: INSERTION PORT-A-CATH (procedure #1);  Surgeon: Aviva Signs, MD;  Location: [facility];  Service: General;  Laterality: N/A;  . SAVORY DILATION N/A 11/16/2015   Procedure: SAVORY DILATION;  Surgeon: Danie Binder, MD;  [facility];  Service: Endoscopy;  Laterality: N/A;  . SHOULDER ACROMIOPLASTY Left 03/01/2016   Procedure: SHOULDER ACROMIOPLASTY;  Surgeon: Earlie Server, MD;  Location: [facility];  Service: Orthopedics;  Laterality: Left;  . SHOULDER ARTHROSCOPY WITH OPEN ROTATOR CUFF REPAIR AND DISTAL CLAVICLE ACROMINECTOMY Left 03/01/2016   Procedure: SHOULDER ARTHROSCOPY WITH SUBACROMIAL DECOMPRESSION and debridement with acromioplasty,OPEN ROTATOR CUFF REPAIR AND DISTAL CLAVICULECTOMY;  Surgeon: Earlie Server, MD;  Location: [facility];  Service: Orthopedics;  Laterality: Left;  Pre/Post Op scalene block  . TOTAL ABDOMINAL HYSTERECTOMY  1995 DUB    OB History    No data available       Home Medications    Prior to Admission medications   Medication Sig Start Date End Date Taking? Authorizing Provider  ALPRAZolam Duanne Moron) 0.5 MG tablet Take 1 tablet (0.5 mg total) by mouth every 8 (eight) hours as needed for anxiety or sleep. 06/05/16  Yes Holley Bouche, NP  dicyclomine (BENTYL) 10 MG capsule Take 1 capsule (10 mg total) by mouth 4 (four) times daily -  before meals and at bedtime. As needed Patient taking differently: Take 10 mg by mouth 4 (four) times daily as needed for spasms.  05/25/16  Yes Samuella Cota, MD  escitalopram (LEXAPRO) 20 MG tablet Take 1.5 tablets (30 mg total) by mouth daily. 1.5  tablet Patient taking differently: Take 30 mg by mouth daily.  05/25/16  Yes Samuella Cota, MD  morphine (MS CONTIN) 60 MG 12 hr tablet Take 1 tablet (60 mg total) by mouth every 12 (twelve) hours. 06/05/16  Yes Holley Bouche, NP  morphine (MSIR) 30 MG tablet Take 60 mg by mouth every 6 (six) hours as needed for severe pain.    Yes Historical Provider, MD  naloxegol oxalate (MOVANTIK) 25 MG TABS tablet Take 1 tablet (25 mg total) by mouth daily. ONLY TAKE IF STILL TAKING PAIN MEDICATIONS 06/12/16  Yes Carlis Stable, NP  Nutritional Supplements (FEEDING SUPPLEMENT, OSMOLITE 1.2 CAL,) LIQD Place 237 mLs into feeding tube every 3 (three) hours.   Yes Historical Provider, MD  oxybutynin (DITROPAN) 5 MG tablet Take 1 tablet (5 mg total) by mouth 3 (three) times daily. Patient taking differently: Take 5 mg by mouth 2 (two) times daily.  05/25/16  Yes Samuella Cota, MD  traZODone (DESYREL) 150 MG tablet Take 150 mg by mouth at bedtime.    Yes Historical Provider, MD    Family History Family History  Problem Relation  Age of Onset  . Birth defects Brother   . Cancer Maternal Grandmother   . Cancer Maternal Grandfather   . Cirrhosis Father   . Colon cancer Neg Hx   . Colon polyps Neg Hx     Social History Social History  Substance Use Topics  . Smoking status: Former Smoker    Packs/day: 0.50    Years: 40.00    Types: Cigarettes    Quit date: 05/25/2014  . Smokeless tobacco: Never Used     Comment: 3 days to go through a pack   . Alcohol use No     Comment: history of ETOH abuse, no alcohol X 10 years      Allergies   Aspirin; Nsaids; and Codeine   Review of Systems Review of Systems  Constitutional: Positive for appetite change and fatigue.  HENT: Positive for sore throat.   Respiratory: Negative for shortness of breath.   Cardiovascular: Negative for chest pain.  Gastrointestinal: Negative for abdominal pain.  Genitourinary: Negative for flank pain.  Musculoskeletal:  Negative for back pain.  Skin: Negative for rash.  Neurological: Negative for seizures.  Hematological: Negative for adenopathy.  Psychiatric/Behavioral: Positive for confusion.     Physical Exam Updated Vital Signs BP (!) 164/92   Pulse 74   Temp 100 F (37.8 C) (Rectal)   Resp 20   Ht _0  (1.626 m)   Wt 108 lb (49 kg)   SpO2 97%   BMI 18.54 kg/m   Physical Exam  Constitutional: She appears well-developed.  HENT:  Head: Normocephalic.  Because membranes are dry. Some thrush on tongue.  Eyes: EOM are normal.  Neck:  Left-sided superior firm neck mass. No tenderness. No erythema.  Cardiovascular: Normal rate.   Pulmonary/Chest: Effort normal.  Port-A-Cath left chest wall  Abdominal: Soft. There is no tenderness.  G-tube to abdomen.  Musculoskeletal: She exhibits no edema.  Neurological: She is alert.  Awake and appropriate, but somewhat slow to answer. Moves all extremities. Able to identify her family at bedside.  Skin: Skin is warm.     ED Treatments / Results  Labs (all labs ordered are listed, but only abnormal results are displayed) Labs Reviewed  COMPREHENSIVE METABOLIC PANEL - Abnormal; Notable for the following:       Result Value   Chloride 96 (*)    CO2 35 (*)    Glucose, Bld 103 (*)    Creatinine, Ser 0.40 (*)    Albumin 2.6 (*)    All other components within normal limits  CBC WITH DIFFERENTIAL/PLATELET - Abnormal; Notable for the following:    RBC 3.72 (*)    Hemoglobin 9.5 (*)    HCT 29.8 (*)    MCH 25.5 (*)    RDW 16.1 (*)    Platelets 116 (*)    Lymphs Abs 0.6 (*)    All other components within normal limits  URINALYSIS, ROUTINE W REFLEX MICROSCOPIC - Abnormal; Notable for the following:    APPearance HAZY (*)    All other components within normal limits  AMMONIA    EKG  EKG Interpretation None       Radiology Dg Chest 2 View  Result Date: 06/25/2016 CLINICAL DATA:  Altered mental status.  Recent pneumothorax. EXAM: CHEST   2 VIEW COMPARISON:  06/22/2016. FINDINGS: Normal sized heart. Clear lungs. The lungs remain hyperexpanded with prominent interstitial markings. Stable left subclavian porta catheter. Aortic arch calcifications. Diffuse osteopenia. Two screws overlying the mid abdomen on the  lateral view. IMPRESSION: 1. Stable changes of COPD. 2. Two screws overlying the mid abdomen on the lateral view. These could be within or external to the patient. Electronically Signed   By: Claudie Revering M.D.   On: 06/25/2016 16:07   Dg Abd 1 View  Result Date: 06/25/2016 CLINICAL DATA:  61 year old female with history of ingested foreign body. EXAM: ABDOMEN - 1 VIEW COMPARISON:  No priors. FINDINGS: Percutaneous gastrostomy tube projecting over the epigastric region. High density material noted in the colon and rectum, presumably retained barium from prior outside CT examination. Overall, bowel gas pattern is nonobstructive. No pneumoperitoneum. Multiple screws are noted within the left ilium, with evidence of old healed fracture. IMPRESSION: 1. No ingested radiopaque foreign body identified. 2. Nonobstructive bowel gas pattern. 3. Additional incidental findings, as above. Electronically Signed   By: Vinnie Langton M.D.   On: 06/25/2016 17:03    Procedures Procedures (including critical care time)  Medications Ordered in ED Medications  sodium chloride 0.9 % bolus 500 mL (0 mLs Intravenous Stopped 06/25/16 1718)  Morphine Sulfate (PF) SOLN 10 mg (10 mg Intravenous Given 06/25/16 1718)     Initial Impression / Assessment and Plan / ED Course  I have reviewed the triage vital signs and the nursing notes.  Pertinent labs & imaging results that were available during my care of the patient were reviewed by me and considered in my medical decision making (see chart for details).     Patient with confusion. Has been going on for a while now. Has had a low-grade temperature. Has neck cancer. Confusion has gotten worse with her  pain medicines. This could be the cause. Also has had decreased oral intake and is getting IV fluids at times. Has follow with oncology tomorrow. Labs reassuring and CT scan done 3 days ago. Discharge home  Final Clinical Impressions(s) / ED Diagnoses   Final diagnoses:  Confusion  Head and neck cancer [facility])    New Prescriptions New Prescriptions   No medications on file     Davonna Belling, MD 06/25/16 1725

## 2016-06-25 NOTE — Discharge Instructions (Signed)
Follow with oncology tomorrow as planned.

## 2016-06-26 ENCOUNTER — Ambulatory Visit: Payer: Medicaid Other

## 2016-06-26 ENCOUNTER — Ambulatory Visit (HOSPITAL_COMMUNITY): Payer: Medicaid Other

## 2016-06-26 ENCOUNTER — Telehealth (HOSPITAL_COMMUNITY): Payer: Self-pay

## 2016-06-26 ENCOUNTER — Encounter (HOSPITAL_COMMUNITY): Payer: Self-pay | Admitting: Lab

## 2016-06-26 NOTE — Progress Notes (Unsigned)
Referral to Hospice.  Records faxed on 3/19

## 2016-06-26 NOTE — Telephone Encounter (Signed)
Patients sister, Janace Hoard, called and stated patient has decided to go with Hospice. Patient does not want any more treatments, including radiation. Notified NP and nurse navigator of patients decision. Message sent to Amy to send referral.

## 2016-06-26 NOTE — Telephone Encounter (Signed)
Patients sister called stating patient is unable to take the long acting medication because she cannot swallow. Sister is giving the short acting morphine via G-tube. Patient was in ER again yesterday for confusion. Sister states they cannot figure out why she is confused but is leaning towards medication causing her confusion. Therefore sister is only giving her 30 mg of MSIR every 6 hours. Sister states the patient is in pain before another dose of pain medication is due. Reviewed with RN. Instructed sister to give patient MSIR 30 mg every 4 hours instead of every 6. Sister verbalized understanding.

## 2016-06-27 ENCOUNTER — Ambulatory Visit: Payer: Medicaid Other

## 2016-06-28 ENCOUNTER — Ambulatory Visit: Payer: Medicaid Other

## 2016-06-28 ENCOUNTER — Ambulatory Visit (HOSPITAL_COMMUNITY): Payer: Medicaid Other

## 2016-06-29 ENCOUNTER — Ambulatory Visit: Payer: Medicaid Other

## 2016-06-30 ENCOUNTER — Ambulatory Visit (HOSPITAL_COMMUNITY): Payer: Medicaid Other

## 2016-06-30 ENCOUNTER — Ambulatory Visit: Payer: Medicaid Other

## 2016-07-03 ENCOUNTER — Ambulatory Visit: Payer: Medicaid Other

## 2016-07-03 ENCOUNTER — Telehealth (HOSPITAL_COMMUNITY): Payer: Self-pay | Admitting: Adult Health

## 2016-07-03 ENCOUNTER — Ambulatory Visit (HOSPITAL_COMMUNITY): Payer: Medicaid Other

## 2016-07-03 NOTE — Telephone Encounter (Signed)
I spoke with both Jennifr and her sister, Janace Hoard.  Jazmen was unable to tell me the plan of care; she was able to tell me that her pain is well-controlled for now.  Penni told me that she had radiation treatment today; she also told me that hospice nurses came out to visit them today.    I was able to speak with [name], the patient's sister. She verified that she patient had NOT been to radiation therapy in about 1 week, when they decided to proceed with hospice alone.  Hospice nurses have been out to their home for evaluation. They are working on [employer] sleep better; she is not sleeping well and "just stays busy all the time doing things that don't make sense."  [name] asks my opinion for Kellee's life expectancy. I shared that based on the quick decline of her mental status, that we are likely looking at weeks of life, rather than months.  [name] became emotional and thanked [place] for all we did for [facility]. I provided emotional support and encouraged her to call us with questions/concerns. Of course, I recommended she contact hospice team first, but we are happy to provide additional support, as needed.    [name] voiced understanding and appreciation.   Mike Craze, NP [place] [phone]

## 2016-07-04 ENCOUNTER — Ambulatory Visit: Payer: Medicaid Other

## 2016-07-05 ENCOUNTER — Ambulatory Visit: Payer: Medicaid Other

## 2016-07-05 ENCOUNTER — Ambulatory Visit (HOSPITAL_COMMUNITY): Payer: Medicaid Other

## 2016-07-05 ENCOUNTER — Ambulatory Visit (HOSPITAL_COMMUNITY): Payer: Medicaid Other | Admitting: Oncology

## 2016-07-06 ENCOUNTER — Ambulatory Visit: Payer: Medicaid Other

## 2016-07-06 ENCOUNTER — Ambulatory Visit (HOSPITAL_COMMUNITY): Payer: Medicaid Other

## 2016-07-07 ENCOUNTER — Ambulatory Visit: Payer: Medicaid Other

## 2016-07-10 ENCOUNTER — Ambulatory Visit: Payer: Medicaid Other

## 2016-07-10 ENCOUNTER — Ambulatory Visit (HOSPITAL_COMMUNITY): Payer: Medicaid Other

## 2016-07-11 ENCOUNTER — Ambulatory Visit: Payer: Medicaid Other

## 2016-07-12 ENCOUNTER — Ambulatory Visit: Payer: Medicaid Other

## 2016-07-12 ENCOUNTER — Ambulatory Visit (HOSPITAL_COMMUNITY): Payer: Medicaid Other

## 2016-07-13 ENCOUNTER — Ambulatory Visit (HOSPITAL_COMMUNITY): Payer: Medicaid Other

## 2016-07-13 ENCOUNTER — Ambulatory Visit (HOSPITAL_COMMUNITY): Payer: Medicaid Other | Admitting: Adult Health

## 2016-07-13 ENCOUNTER — Ambulatory Visit: Payer: Medicaid Other

## 2016-07-14 ENCOUNTER — Ambulatory Visit: Payer: Medicaid Other

## 2016-07-17 ENCOUNTER — Ambulatory Visit (HOSPITAL_COMMUNITY): Payer: Medicaid Other

## 2016-07-17 ENCOUNTER — Ambulatory Visit: Payer: Medicaid Other

## 2016-07-18 ENCOUNTER — Ambulatory Visit: Payer: Medicaid Other

## 2016-07-20 ENCOUNTER — Ambulatory Visit (HOSPITAL_COMMUNITY): Payer: Medicaid Other

## 2016-07-24 ENCOUNTER — Ambulatory Visit (HOSPITAL_COMMUNITY): Payer: Medicaid Other

## 2016-07-27 ENCOUNTER — Ambulatory Visit (HOSPITAL_COMMUNITY): Payer: Medicaid Other

## 2016-07-31 ENCOUNTER — Ambulatory Visit (HOSPITAL_COMMUNITY): Payer: Medicaid Other

## 2016-08-03 ENCOUNTER — Ambulatory Visit (HOSPITAL_COMMUNITY): Payer: Medicaid Other

## 2016-08-07 ENCOUNTER — Ambulatory Visit (HOSPITAL_COMMUNITY): Payer: Medicaid Other

## 2016-08-08 DEATH — deceased

## 2016-08-10 ENCOUNTER — Ambulatory Visit (HOSPITAL_COMMUNITY): Payer: Medicaid Other

## 2016-08-19 ENCOUNTER — Other Ambulatory Visit: Payer: Self-pay | Admitting: Nurse Practitioner

## 2016-11-08 ENCOUNTER — Ambulatory Visit: Payer: Medicaid Other | Admitting: Nurse Practitioner

## 2017-03-23 IMAGING — RF DG SWALLOWING FUNCTION - NRPT [facility]
13 of 20 series · 13 of 24 positions shown · non-contrast
Comparison: none

[Series 1: before po · 1 of 90 frames shown]
[frame 14/90]
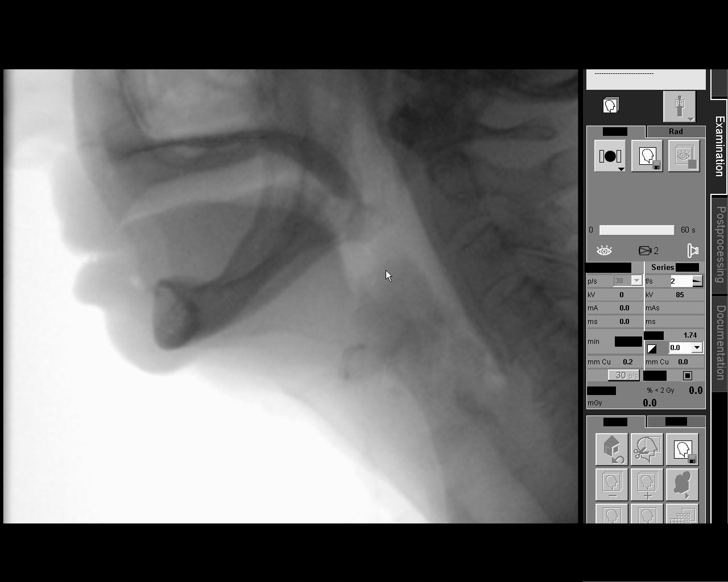

[Series 3: cup sips thin sequential · 1 of 404 frames shown]
[frame 61/404]
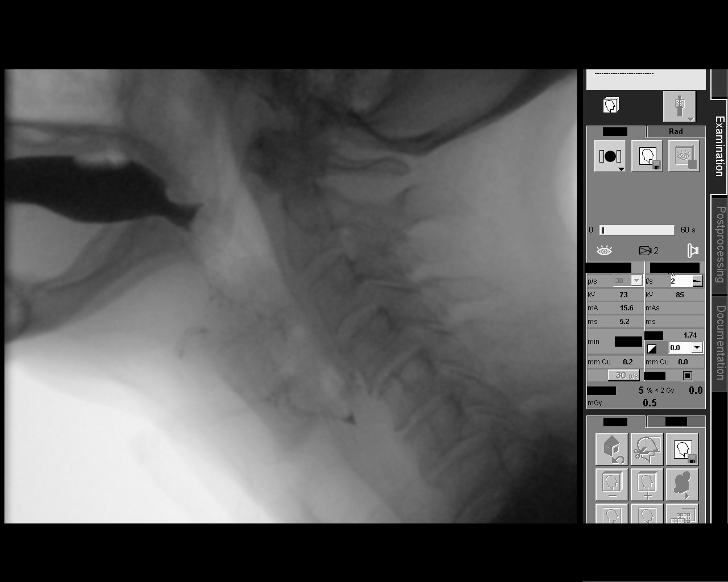

[Series 4: run · 1 of 34 frames shown (1 of 3)]
[frame 29/34]
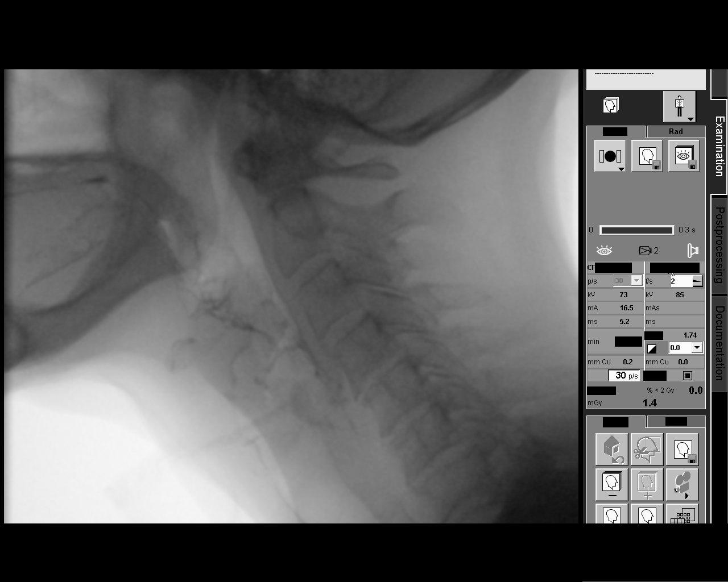

[Series 6: puree · 1 of 174 frames shown (1 of 2)]
[frame 42/174]
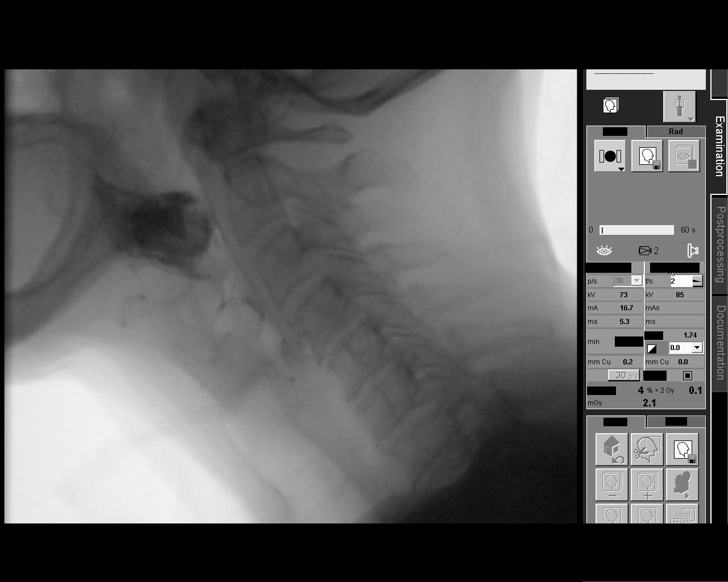

[Series 8: run · 1 of 49 frames shown (2 of 3)]
[frame 8/49]
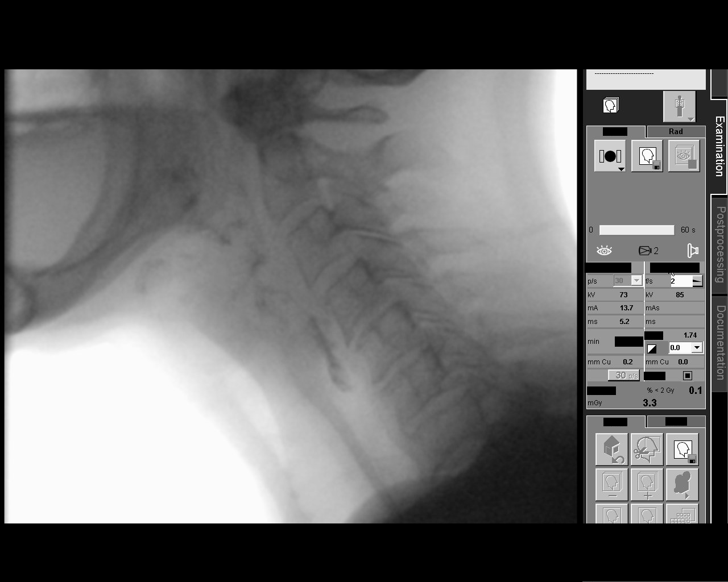

[Series 9: mech soft · 1 of 872 frames shown]
[frame 742/872]
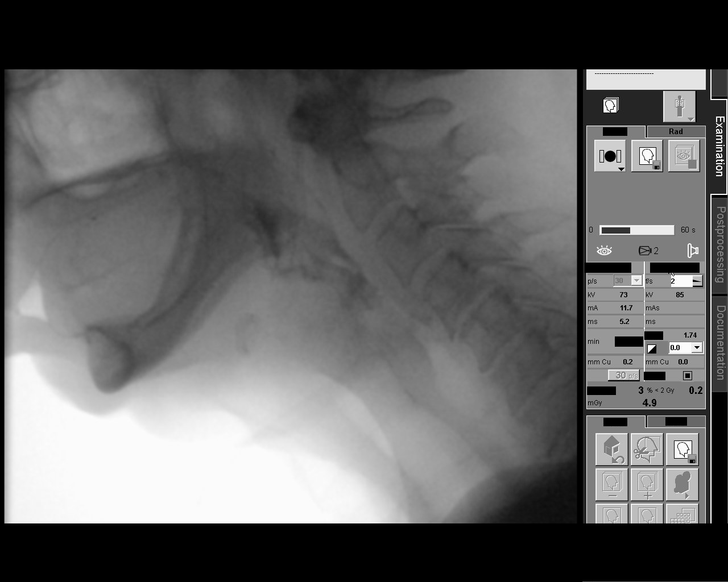

[Series 11: pill with thin, aspiration · 1 of 818 frames shown]
[frame 642/818]
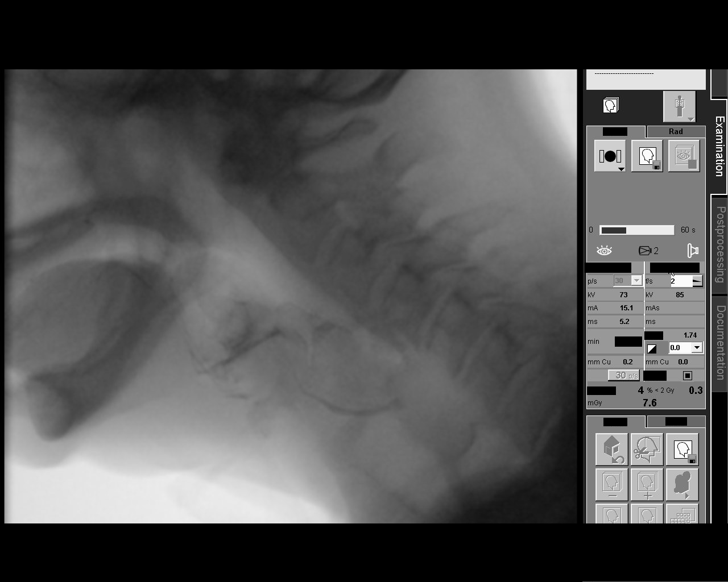

[Series 12: esophageal sweep · 1 of 687 frames shown]
[frame 129/687]
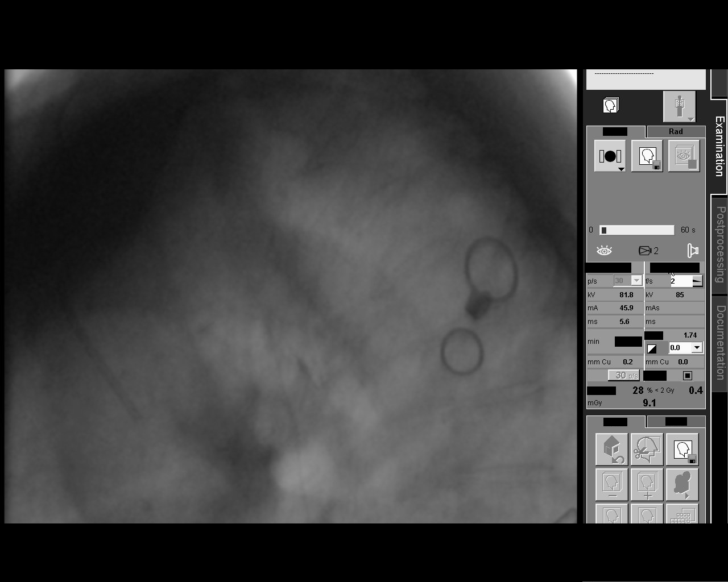

[Series 14: head turn left with straw thin · 1 of 313 frames shown]
[frame 47/313]
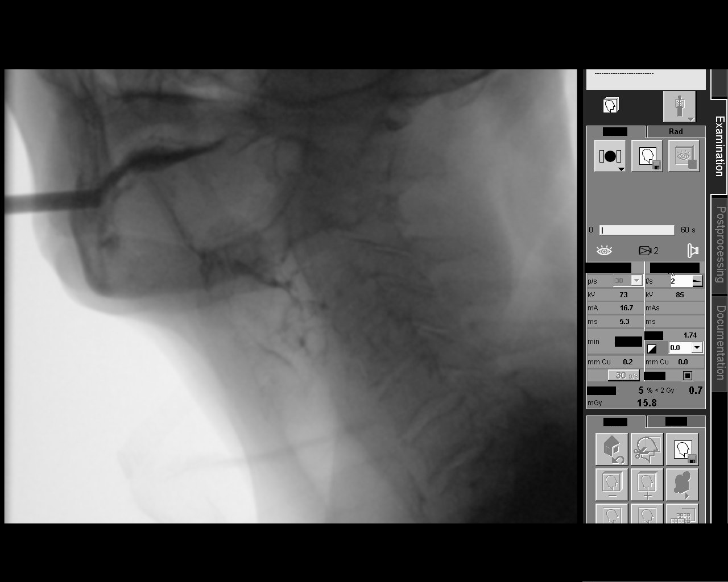

[Series 15: ap view · 1 of 99 frames shown]
[frame 85/99]
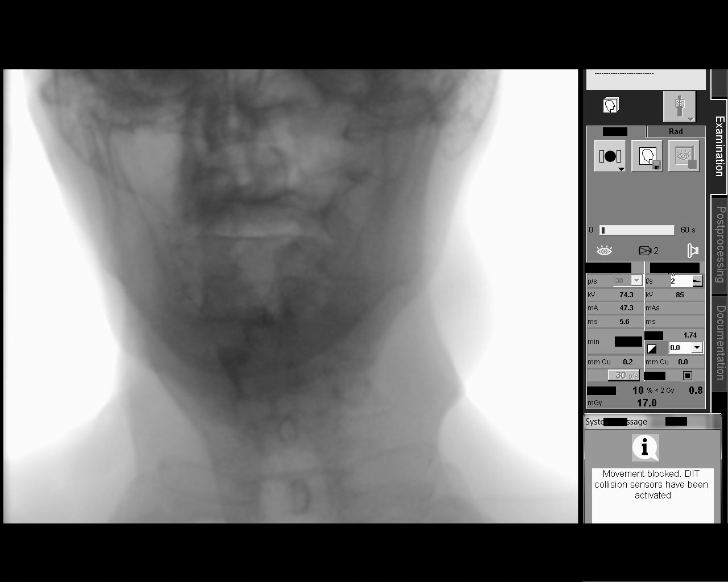

[Series 17: cued dry swallow · 1 of 122 frames shown]
[frame 62/122]
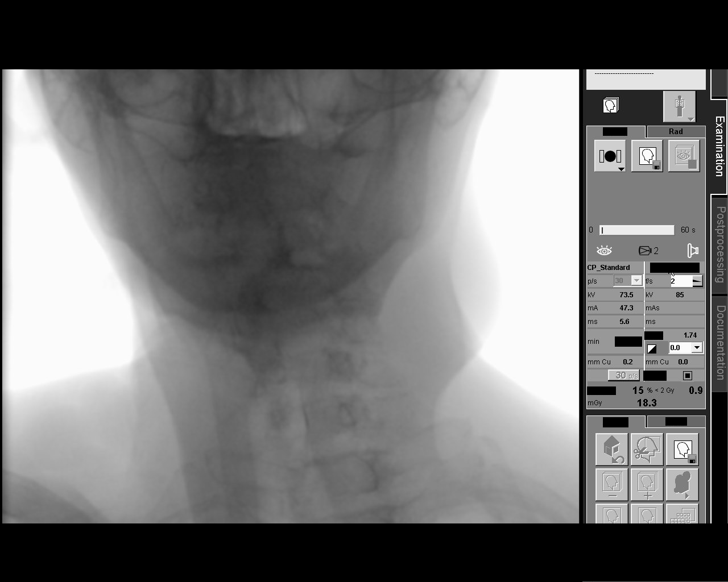

[Series 19: run · 1 of 35 frames shown (3 of 3)]
[frame 6/35]
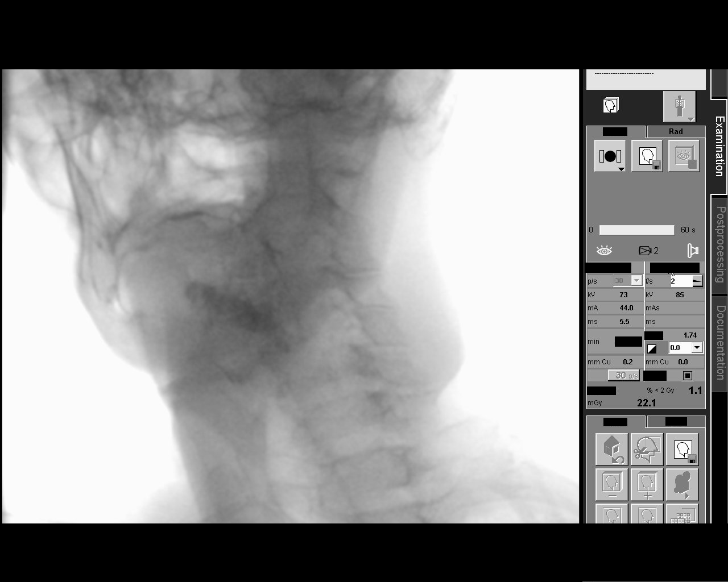

[Series 20: puree · 1 of 284 frames shown (2 of 2)]
[frame 278/284]
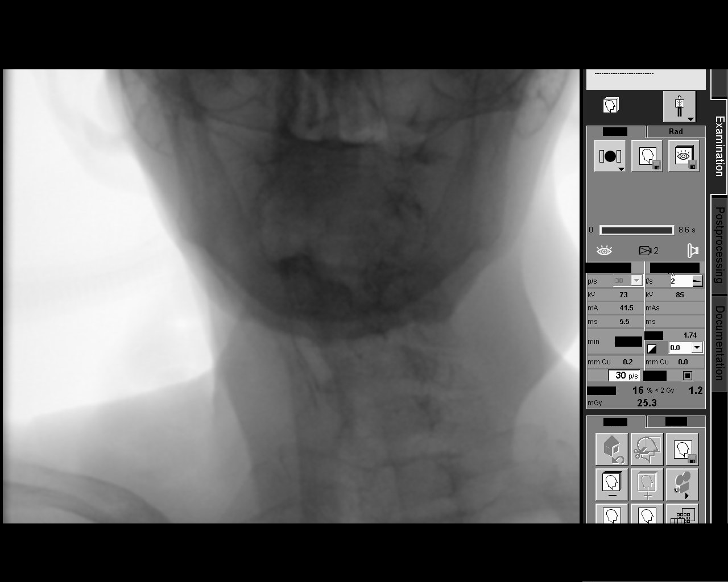

[13 of 24 positions shown; findings below may reference images not displayed]

FLUOROSCOPY FOR SWALLOWING FUNCTION STUDY:
Fluoroscopy was provided for swallowing function study, which was administered by a speech pathologist.  Final results and recommendations from this study are contained within the speech pathology report.

## 2017-03-28 IMAGING — CR DG ABDOMEN 1V
1 series · 1 of 1 positions shown · non-contrast
Comparison: No priors.

CLINICAL DATA: 61-year-old female with history of ingested foreign
body.

EXAM:
ABDOMEN - 1 VIEW

[erect ap]
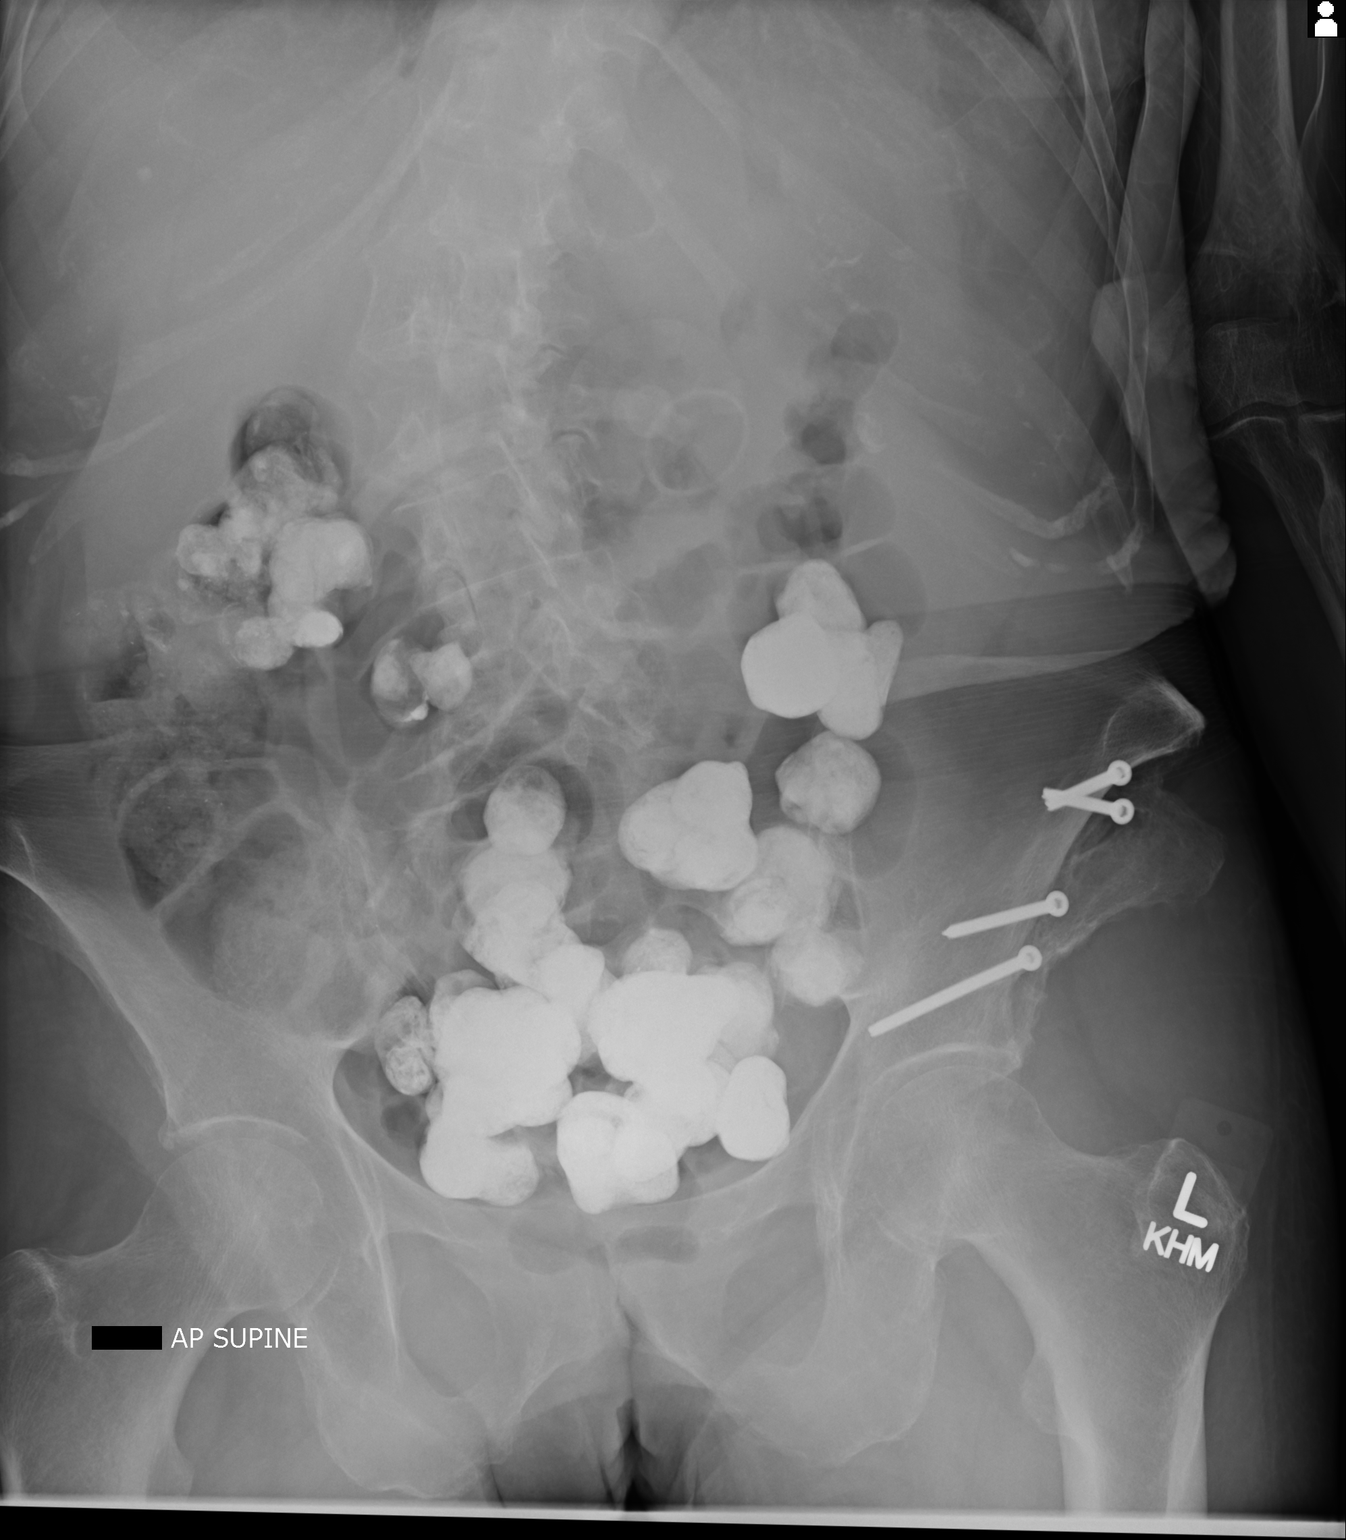

[1 of 1 positions shown; findings below may reference images not displayed]

FINDINGS: Percutaneous gastrostomy tube projecting over the epigastric region.
High density material noted in the colon and rectum, presumably
retained barium from prior outside CT examination. Overall, bowel
gas pattern is nonobstructive. No pneumoperitoneum. Multiple screws
are noted within the left ilium, with evidence of old healed
fracture.
IMPRESSION: 1. No ingested radiopaque foreign body identified.
2. Nonobstructive bowel gas pattern.
3. Additional incidental findings, as above.

## 2017-03-28 IMAGING — DX DG CHEST 2V
2 series · 2 of 2 positions shown · non-contrast
Comparison: 06/22/2016.

CLINICAL DATA: Altered mental status.  Recent pneumothorax.

EXAM:
CHEST  2 VIEW

[chest lat]
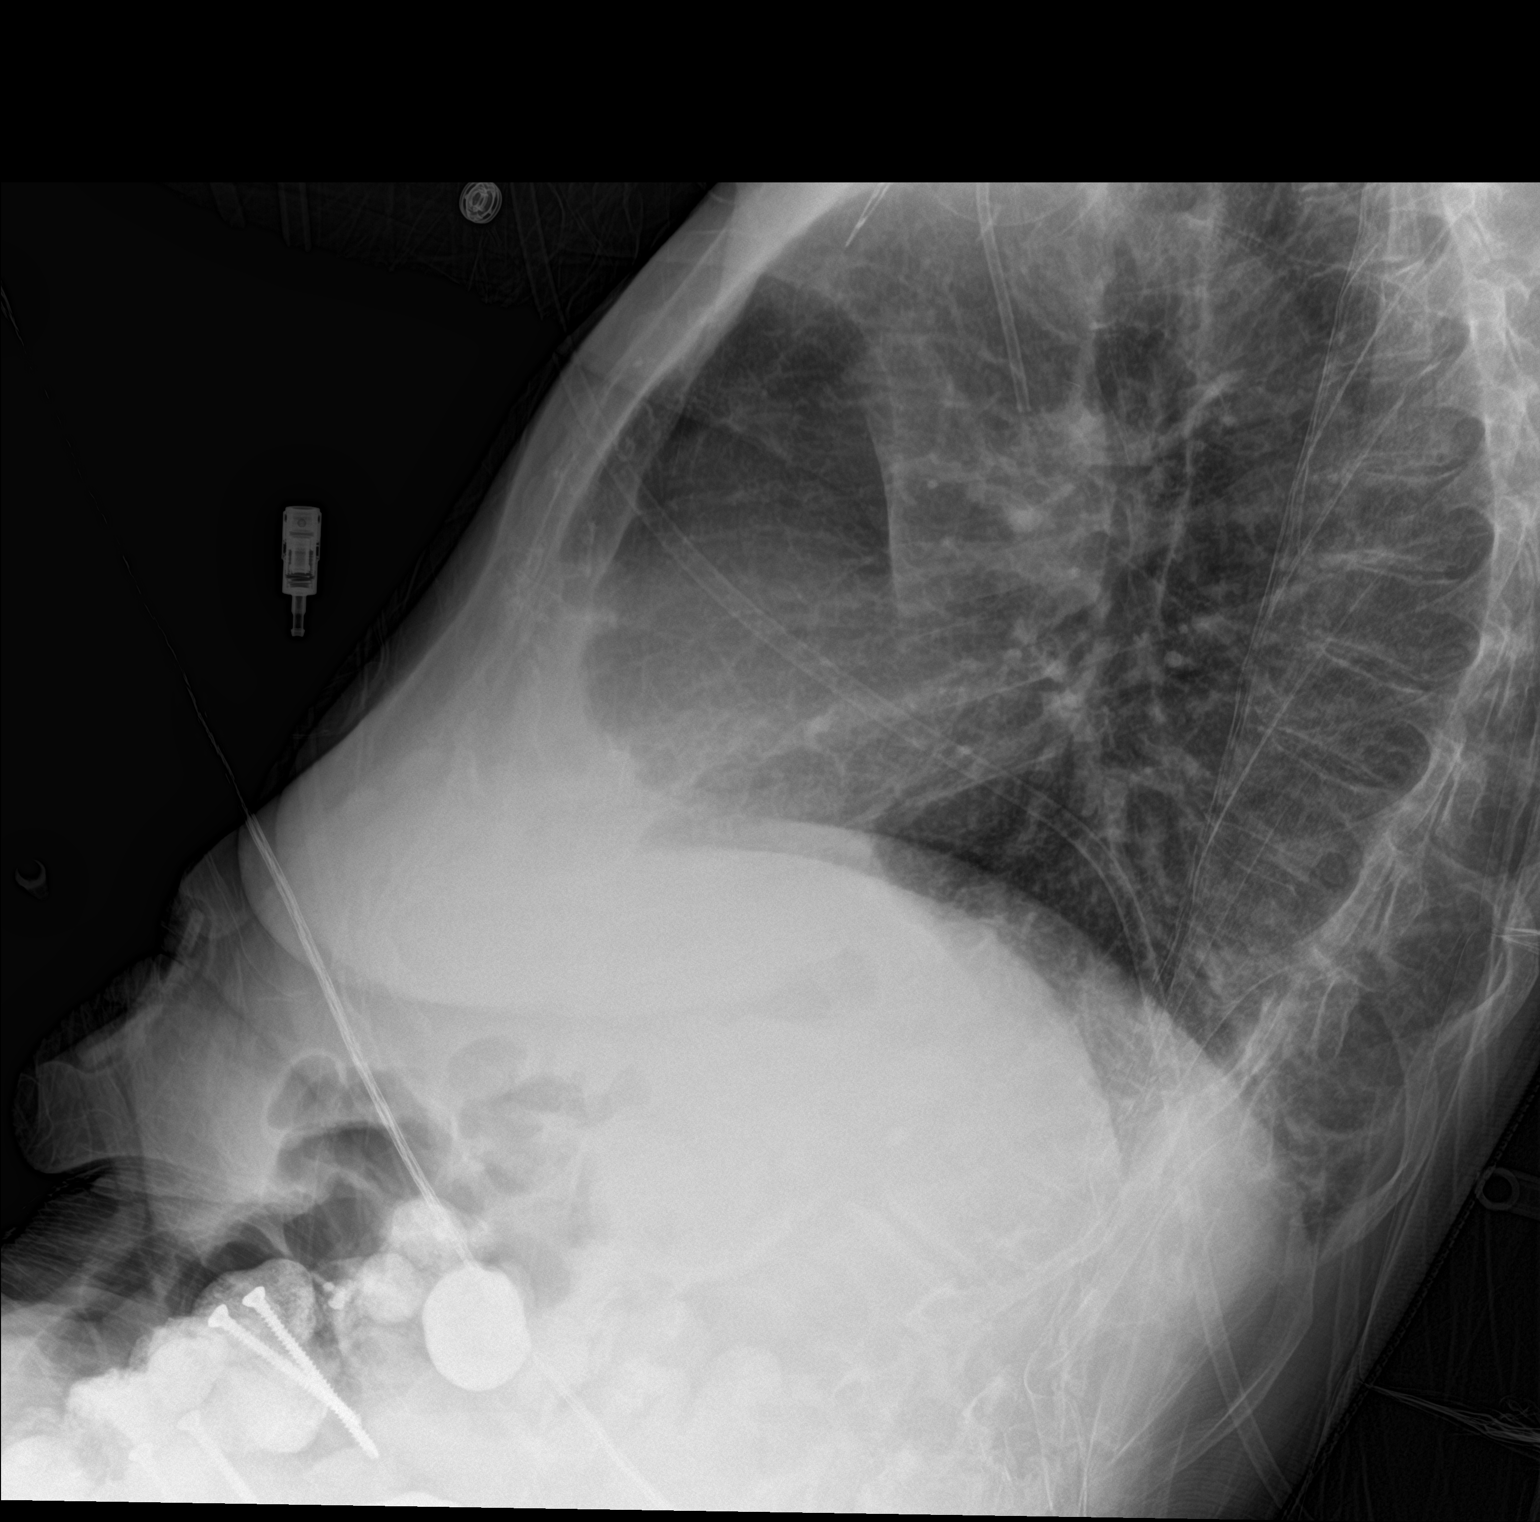

[chest ap]
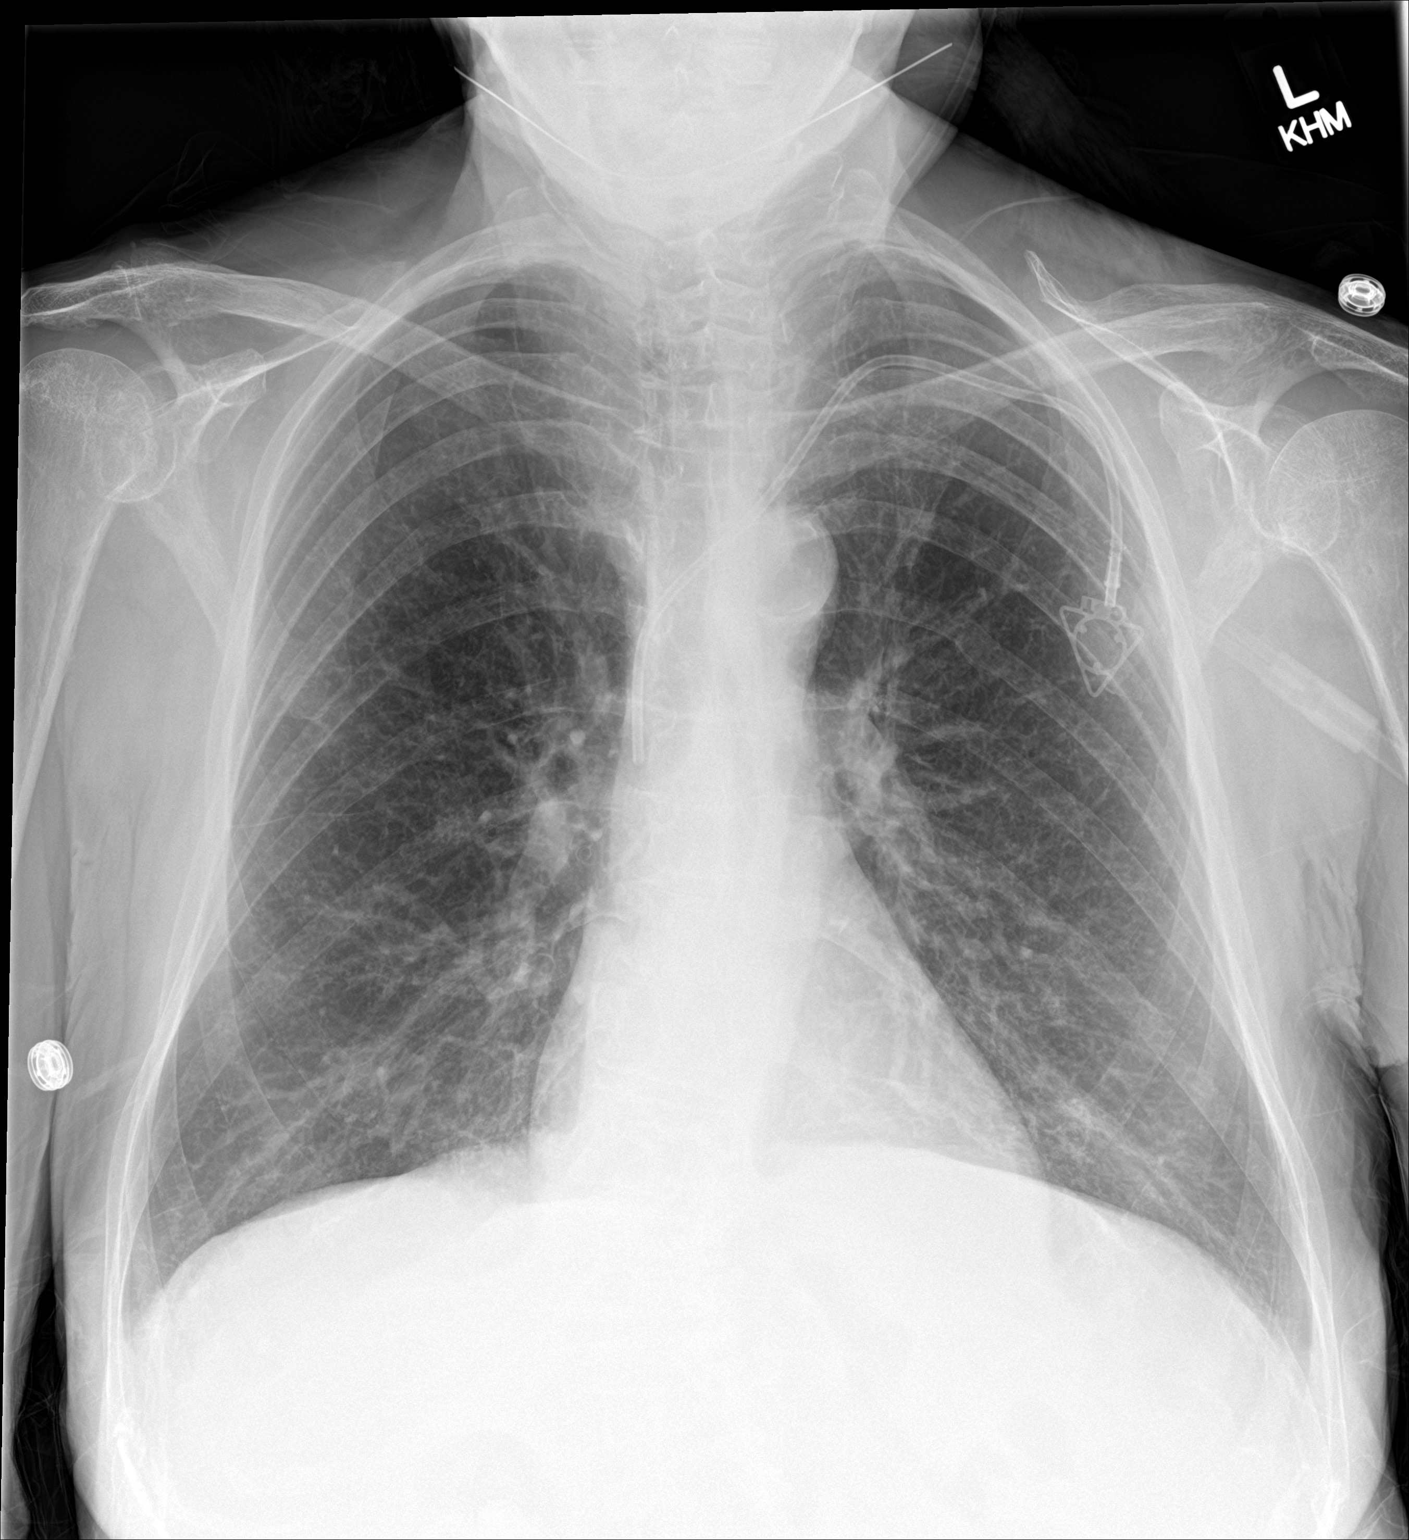

[2 of 2 positions shown; findings below may reference images not displayed]

FINDINGS: Normal sized heart. Clear lungs. The lungs remain hyperexpanded with
prominent interstitial markings. Stable left subclavian porta
catheter. Aortic arch calcifications. Diffuse osteopenia. Two screws
overlying the mid abdomen on the lateral view.
IMPRESSION: 1. Stable changes of COPD.
2. Two screws overlying the mid abdomen on the lateral view. These
could be within or external to the patient.

## 2018-08-05 NOTE — Progress Notes (Signed)
REVIEWED-NO ADDITIONAL RECOMMENDATIONS.
